# Patient Record
Sex: Female | Born: 1935 | Hispanic: No | Marital: Single | Smoking: Never smoker
Health system: Southern US, Community
[De-identification: ages and names within clinical notes are randomized; demographics above are authoritative.]

## PROBLEM LIST (undated history)

## (undated) DIAGNOSIS — K589 Irritable bowel syndrome without diarrhea: Secondary | ICD-10-CM

## (undated) DIAGNOSIS — M545 Low back pain, unspecified: Secondary | ICD-10-CM

## (undated) DIAGNOSIS — I509 Heart failure, unspecified: Secondary | ICD-10-CM

## (undated) DIAGNOSIS — F32A Depression, unspecified: Secondary | ICD-10-CM

## (undated) DIAGNOSIS — I10 Essential (primary) hypertension: Secondary | ICD-10-CM

## (undated) DIAGNOSIS — F329 Major depressive disorder, single episode, unspecified: Secondary | ICD-10-CM

## (undated) DIAGNOSIS — K219 Gastro-esophageal reflux disease without esophagitis: Secondary | ICD-10-CM

## (undated) DIAGNOSIS — E785 Hyperlipidemia, unspecified: Secondary | ICD-10-CM

## (undated) DIAGNOSIS — K5792 Diverticulitis of intestine, part unspecified, without perforation or abscess without bleeding: Secondary | ICD-10-CM

## (undated) DIAGNOSIS — K5909 Other constipation: Secondary | ICD-10-CM

## (undated) DIAGNOSIS — K635 Polyp of colon: Secondary | ICD-10-CM

## (undated) HISTORY — DX: Hyperlipidemia, unspecified: E78.5

## (undated) HISTORY — DX: Low back pain: M54.5

## (undated) HISTORY — DX: Diverticulitis of intestine, part unspecified, without perforation or abscess without bleeding: K57.92

## (undated) HISTORY — PX: APPENDECTOMY: SHX54

## (undated) HISTORY — DX: Gastro-esophageal reflux disease without esophagitis: K21.9

## (undated) HISTORY — DX: Other constipation: K59.09

## (undated) HISTORY — DX: Irritable bowel syndrome, unspecified: K58.9

## (undated) HISTORY — DX: Major depressive disorder, single episode, unspecified: F32.9

## (undated) HISTORY — DX: Heart failure, unspecified: I50.9

## (undated) HISTORY — DX: Low back pain, unspecified: M54.50

## (undated) HISTORY — DX: Polyp of colon: K63.5

## (undated) HISTORY — PX: TUBAL LIGATION: SHX77

## (undated) HISTORY — PX: CATARACT EXTRACTION: SUR2

## (undated) HISTORY — DX: Depression, unspecified: F32.A

---

## 1993-05-07 ENCOUNTER — Encounter (INDEPENDENT_AMBULATORY_CARE_PROVIDER_SITE_OTHER): Payer: Self-pay | Admitting: Gastroenterology

## 1997-12-19 ENCOUNTER — Ambulatory Visit (HOSPITAL_COMMUNITY): Admission: RE | Admit: 1997-12-19 | Discharge: 1997-12-19 | Payer: Self-pay | Admitting: Gastroenterology

## 1997-12-19 ENCOUNTER — Encounter: Payer: Self-pay | Admitting: Gastroenterology

## 1998-02-01 ENCOUNTER — Encounter: Payer: Self-pay | Admitting: Internal Medicine

## 1998-02-01 ENCOUNTER — Ambulatory Visit (HOSPITAL_COMMUNITY): Admission: RE | Admit: 1998-02-01 | Discharge: 1998-02-01 | Payer: Self-pay | Admitting: Internal Medicine

## 2000-04-09 ENCOUNTER — Other Ambulatory Visit: Admission: RE | Admit: 2000-04-09 | Discharge: 2000-04-09 | Payer: Self-pay | Admitting: *Deleted

## 2001-03-22 LAB — HM COLONOSCOPY

## 2001-04-08 ENCOUNTER — Encounter (INDEPENDENT_AMBULATORY_CARE_PROVIDER_SITE_OTHER): Payer: Self-pay | Admitting: Gastroenterology

## 2001-04-29 ENCOUNTER — Encounter: Payer: Self-pay | Admitting: Ophthalmology

## 2001-05-04 ENCOUNTER — Ambulatory Visit (HOSPITAL_COMMUNITY): Admission: RE | Admit: 2001-05-04 | Discharge: 2001-05-04 | Payer: Self-pay | Admitting: Ophthalmology

## 2001-08-13 ENCOUNTER — Other Ambulatory Visit: Admission: RE | Admit: 2001-08-13 | Discharge: 2001-08-13 | Payer: Self-pay | Admitting: Obstetrics & Gynecology

## 2003-09-15 ENCOUNTER — Ambulatory Visit (HOSPITAL_COMMUNITY): Admission: RE | Admit: 2003-09-15 | Discharge: 2003-09-15 | Payer: Self-pay | Admitting: Gastroenterology

## 2003-11-22 ENCOUNTER — Ambulatory Visit: Payer: Self-pay | Admitting: Internal Medicine

## 2003-12-18 ENCOUNTER — Ambulatory Visit: Payer: Self-pay | Admitting: Internal Medicine

## 2004-04-03 ENCOUNTER — Ambulatory Visit: Payer: Self-pay | Admitting: Gastroenterology

## 2004-05-09 ENCOUNTER — Ambulatory Visit: Payer: Self-pay | Admitting: Gastroenterology

## 2004-06-11 ENCOUNTER — Emergency Department (HOSPITAL_COMMUNITY): Admission: EM | Admit: 2004-06-11 | Discharge: 2004-06-11 | Payer: Self-pay | Admitting: Family Medicine

## 2004-10-21 ENCOUNTER — Ambulatory Visit: Payer: Self-pay | Admitting: Internal Medicine

## 2004-12-03 ENCOUNTER — Ambulatory Visit: Payer: Self-pay | Admitting: Internal Medicine

## 2005-04-09 ENCOUNTER — Ambulatory Visit: Payer: Self-pay | Admitting: Internal Medicine

## 2005-05-26 ENCOUNTER — Ambulatory Visit: Payer: Self-pay | Admitting: Internal Medicine

## 2005-06-18 ENCOUNTER — Ambulatory Visit: Payer: Self-pay | Admitting: Internal Medicine

## 2005-10-23 ENCOUNTER — Ambulatory Visit: Payer: Self-pay | Admitting: Internal Medicine

## 2005-12-17 ENCOUNTER — Ambulatory Visit: Payer: Self-pay | Admitting: Internal Medicine

## 2005-12-24 ENCOUNTER — Ambulatory Visit (HOSPITAL_COMMUNITY): Admission: RE | Admit: 2005-12-24 | Discharge: 2005-12-24 | Payer: Self-pay | Admitting: Otolaryngology

## 2006-03-03 ENCOUNTER — Ambulatory Visit: Payer: Self-pay | Admitting: Internal Medicine

## 2006-03-25 ENCOUNTER — Ambulatory Visit: Payer: Self-pay | Admitting: Internal Medicine

## 2006-04-07 ENCOUNTER — Ambulatory Visit: Payer: Self-pay | Admitting: Internal Medicine

## 2006-04-07 LAB — CONVERTED CEMR LAB
OCCULT 1: NEGATIVE
OCCULT 3: NEGATIVE
OCCULT 4: NEGATIVE

## 2006-04-20 ENCOUNTER — Ambulatory Visit: Payer: Self-pay | Admitting: Internal Medicine

## 2006-06-19 ENCOUNTER — Ambulatory Visit: Payer: Self-pay | Admitting: Gastroenterology

## 2006-09-12 DIAGNOSIS — F329 Major depressive disorder, single episode, unspecified: Secondary | ICD-10-CM

## 2006-09-12 DIAGNOSIS — Z860100 Personal history of colon polyps, unspecified: Secondary | ICD-10-CM | POA: Insufficient documentation

## 2006-09-12 DIAGNOSIS — I509 Heart failure, unspecified: Secondary | ICD-10-CM

## 2006-09-12 DIAGNOSIS — E785 Hyperlipidemia, unspecified: Secondary | ICD-10-CM

## 2006-09-12 DIAGNOSIS — K589 Irritable bowel syndrome without diarrhea: Secondary | ICD-10-CM

## 2006-09-12 DIAGNOSIS — K219 Gastro-esophageal reflux disease without esophagitis: Secondary | ICD-10-CM

## 2006-09-12 DIAGNOSIS — M545 Low back pain: Secondary | ICD-10-CM

## 2006-09-12 DIAGNOSIS — J309 Allergic rhinitis, unspecified: Secondary | ICD-10-CM | POA: Insufficient documentation

## 2006-09-22 ENCOUNTER — Ambulatory Visit: Payer: Self-pay | Admitting: Internal Medicine

## 2006-09-22 LAB — CONVERTED CEMR LAB
Bilirubin Urine: NEGATIVE
Ketones, ur: NEGATIVE mg/dL
Specific Gravity, Urine: 1.01 (ref 1.000–1.03)
Urine Glucose: NEGATIVE mg/dL
Urobilinogen, UA: 0.2 (ref 0.0–1.0)

## 2006-10-07 ENCOUNTER — Encounter: Admission: RE | Admit: 2006-10-07 | Discharge: 2006-10-07 | Payer: Self-pay | Admitting: Otolaryngology

## 2006-10-07 ENCOUNTER — Encounter (INDEPENDENT_AMBULATORY_CARE_PROVIDER_SITE_OTHER): Payer: Self-pay | Admitting: Interventional Radiology

## 2006-10-07 ENCOUNTER — Other Ambulatory Visit: Admission: RE | Admit: 2006-10-07 | Discharge: 2006-10-07 | Payer: Self-pay | Admitting: Interventional Radiology

## 2006-11-03 ENCOUNTER — Ambulatory Visit: Payer: Self-pay | Admitting: Internal Medicine

## 2007-01-05 ENCOUNTER — Ambulatory Visit: Payer: Self-pay | Admitting: Internal Medicine

## 2007-03-26 DIAGNOSIS — I2510 Atherosclerotic heart disease of native coronary artery without angina pectoris: Secondary | ICD-10-CM | POA: Insufficient documentation

## 2007-03-26 DIAGNOSIS — E041 Nontoxic single thyroid nodule: Secondary | ICD-10-CM | POA: Insufficient documentation

## 2007-03-26 DIAGNOSIS — K2990 Gastroduodenitis, unspecified, without bleeding: Secondary | ICD-10-CM

## 2007-03-26 DIAGNOSIS — I10 Essential (primary) hypertension: Secondary | ICD-10-CM | POA: Insufficient documentation

## 2007-03-26 DIAGNOSIS — G4700 Insomnia, unspecified: Secondary | ICD-10-CM

## 2007-03-26 DIAGNOSIS — N200 Calculus of kidney: Secondary | ICD-10-CM

## 2007-03-26 DIAGNOSIS — K2970 Gastritis, unspecified, without bleeding: Secondary | ICD-10-CM | POA: Insufficient documentation

## 2007-04-16 ENCOUNTER — Ambulatory Visit: Payer: Self-pay | Admitting: Internal Medicine

## 2007-04-16 DIAGNOSIS — N3090 Cystitis, unspecified without hematuria: Secondary | ICD-10-CM | POA: Insufficient documentation

## 2007-04-16 DIAGNOSIS — R109 Unspecified abdominal pain: Secondary | ICD-10-CM | POA: Insufficient documentation

## 2007-04-21 LAB — CONVERTED CEMR LAB
Bilirubin Urine: NEGATIVE
Hemoglobin, Urine: NEGATIVE
Specific Gravity, Urine: 1.01 (ref 1.000–1.03)
Total Protein, Urine: NEGATIVE mg/dL
Urine Glucose: NEGATIVE mg/dL

## 2007-05-04 ENCOUNTER — Ambulatory Visit: Payer: Self-pay | Admitting: Internal Medicine

## 2007-05-04 DIAGNOSIS — K5900 Constipation, unspecified: Secondary | ICD-10-CM

## 2007-05-04 LAB — CONVERTED CEMR LAB
Basophils Absolute: 0.4 10*3/uL — ABNORMAL HIGH (ref 0.0–0.1)
Basophils Relative: 4.4 % — ABNORMAL HIGH (ref 0.0–1.0)
Bilirubin Urine: NEGATIVE
CO2: 33 meq/L — ABNORMAL HIGH (ref 19–32)
Calcium: 9.5 mg/dL (ref 8.4–10.5)
Creatinine, Ser: 0.9 mg/dL (ref 0.4–1.2)
Crystals: NEGATIVE
Eosinophils Absolute: 0.4 10*3/uL (ref 0.0–0.7)
GFR calc Af Amer: 77 mL/min
HCT: 39.8 % (ref 36.0–46.0)
Hemoglobin: 12.9 g/dL (ref 12.0–15.0)
Ketones, ur: NEGATIVE mg/dL
Lymphocytes Relative: 23.3 % (ref 12.0–46.0)
MCHC: 32.4 g/dL (ref 30.0–36.0)
MCV: 87.7 fL (ref 78.0–100.0)
Monocytes Absolute: 0.8 10*3/uL (ref 0.1–1.0)
Neutro Abs: 4.7 10*3/uL (ref 1.4–7.7)
RBC: 4.54 M/uL (ref 3.87–5.11)
RDW: 12.4 % (ref 11.5–14.6)
Sodium: 140 meq/L (ref 135–145)
Urine Glucose: NEGATIVE mg/dL
pH: 7 (ref 5.0–8.0)

## 2007-05-05 ENCOUNTER — Telehealth: Payer: Self-pay | Admitting: Internal Medicine

## 2007-05-11 ENCOUNTER — Ambulatory Visit: Payer: Self-pay | Admitting: Cardiology

## 2007-07-07 ENCOUNTER — Ambulatory Visit: Payer: Self-pay | Admitting: Internal Medicine

## 2007-08-10 ENCOUNTER — Telehealth: Payer: Self-pay | Admitting: Internal Medicine

## 2007-08-11 ENCOUNTER — Ambulatory Visit: Payer: Self-pay | Admitting: Internal Medicine

## 2007-08-31 ENCOUNTER — Ambulatory Visit: Payer: Self-pay | Admitting: Internal Medicine

## 2007-08-31 DIAGNOSIS — R071 Chest pain on breathing: Secondary | ICD-10-CM

## 2007-10-15 ENCOUNTER — Ambulatory Visit: Payer: Self-pay | Admitting: Internal Medicine

## 2007-10-15 DIAGNOSIS — R609 Edema, unspecified: Secondary | ICD-10-CM

## 2007-10-19 ENCOUNTER — Ambulatory Visit: Payer: Self-pay | Admitting: Internal Medicine

## 2007-10-19 LAB — CONVERTED CEMR LAB
Bilirubin Urine: NEGATIVE
CO2: 32 meq/L (ref 19–32)
Calcium: 9.5 mg/dL (ref 8.4–10.5)
Creatinine, Ser: 1.1 mg/dL (ref 0.4–1.2)
GFR calc Af Amer: 61 mL/min
Glucose, Bld: 102 mg/dL — ABNORMAL HIGH (ref 70–99)
Hemoglobin, Urine: NEGATIVE
Nitrite: NEGATIVE
Sodium: 141 meq/L (ref 135–145)
TSH: 0.58 microintl units/mL (ref 0.35–5.50)
Total Protein, Urine: NEGATIVE mg/dL
Urobilinogen, UA: 0.2 (ref 0.0–1.0)

## 2007-10-27 ENCOUNTER — Telehealth: Payer: Self-pay | Admitting: Internal Medicine

## 2008-01-17 ENCOUNTER — Telehealth (INDEPENDENT_AMBULATORY_CARE_PROVIDER_SITE_OTHER): Payer: Self-pay | Admitting: *Deleted

## 2008-01-20 ENCOUNTER — Telehealth: Payer: Self-pay | Admitting: Family Medicine

## 2008-01-26 ENCOUNTER — Ambulatory Visit: Payer: Self-pay | Admitting: Internal Medicine

## 2008-01-31 LAB — CONVERTED CEMR LAB
Bilirubin Urine: NEGATIVE
Hemoglobin, Urine: NEGATIVE
Ketones, ur: NEGATIVE mg/dL
Total Protein, Urine: NEGATIVE mg/dL
pH: 8.5 — AB (ref 5.0–8.0)

## 2008-04-04 ENCOUNTER — Telehealth: Payer: Self-pay | Admitting: Internal Medicine

## 2008-04-17 ENCOUNTER — Ambulatory Visit: Payer: Self-pay | Admitting: Internal Medicine

## 2008-04-17 DIAGNOSIS — M25519 Pain in unspecified shoulder: Secondary | ICD-10-CM

## 2008-04-17 DIAGNOSIS — M79609 Pain in unspecified limb: Secondary | ICD-10-CM

## 2008-07-13 ENCOUNTER — Ambulatory Visit: Payer: Self-pay | Admitting: Internal Medicine

## 2008-07-13 LAB — CONVERTED CEMR LAB
Bilirubin Urine: NEGATIVE
Ketones, ur: NEGATIVE mg/dL
Specific Gravity, Urine: 1.01 (ref 1.000–1.030)
Total Protein, Urine: NEGATIVE mg/dL
Urine Glucose: NEGATIVE mg/dL
pH: 7.5 (ref 5.0–8.0)

## 2008-07-14 ENCOUNTER — Telehealth: Payer: Self-pay | Admitting: Internal Medicine

## 2008-07-23 ENCOUNTER — Telehealth: Payer: Self-pay | Admitting: Family Medicine

## 2008-09-18 ENCOUNTER — Ambulatory Visit: Payer: Self-pay | Admitting: Internal Medicine

## 2008-09-18 DIAGNOSIS — J209 Acute bronchitis, unspecified: Secondary | ICD-10-CM

## 2008-11-17 ENCOUNTER — Telehealth: Payer: Self-pay | Admitting: Internal Medicine

## 2008-11-29 ENCOUNTER — Telehealth: Payer: Self-pay | Admitting: Internal Medicine

## 2009-01-23 ENCOUNTER — Ambulatory Visit: Payer: Self-pay | Admitting: Internal Medicine

## 2009-01-23 LAB — CONVERTED CEMR LAB
Specific Gravity, Urine: 1.01 (ref 1.000–1.030)
Total Protein, Urine: NEGATIVE mg/dL
Urine Glucose: NEGATIVE mg/dL
Urobilinogen, UA: 0.2 (ref 0.0–1.0)
pH: 8.5 (ref 5.0–8.0)

## 2009-02-09 ENCOUNTER — Telehealth: Payer: Self-pay | Admitting: Internal Medicine

## 2009-02-17 ENCOUNTER — Encounter: Payer: Self-pay | Admitting: Internal Medicine

## 2009-02-19 ENCOUNTER — Telehealth: Payer: Self-pay | Admitting: Internal Medicine

## 2009-04-10 ENCOUNTER — Telehealth: Payer: Self-pay | Admitting: Internal Medicine

## 2009-04-11 ENCOUNTER — Ambulatory Visit: Payer: Self-pay | Admitting: Internal Medicine

## 2009-04-11 DIAGNOSIS — E876 Hypokalemia: Secondary | ICD-10-CM

## 2009-04-12 ENCOUNTER — Telehealth: Payer: Self-pay | Admitting: Internal Medicine

## 2009-04-18 ENCOUNTER — Telehealth: Payer: Self-pay | Admitting: Internal Medicine

## 2009-04-23 ENCOUNTER — Ambulatory Visit: Payer: Self-pay | Admitting: Internal Medicine

## 2009-04-23 LAB — CONVERTED CEMR LAB
ALT: 13 units/L (ref 0–35)
BUN: 9 mg/dL (ref 6–23)
CO2: 36 meq/L — ABNORMAL HIGH (ref 19–32)
Chloride: 101 meq/L (ref 96–112)
Creatinine, Ser: 1.1 mg/dL (ref 0.4–1.2)
TSH: 0.55 microintl units/mL (ref 0.35–5.50)
Total Protein: 7.9 g/dL (ref 6.0–8.3)

## 2009-04-24 ENCOUNTER — Ambulatory Visit: Payer: Self-pay | Admitting: Internal Medicine

## 2009-05-02 ENCOUNTER — Telehealth (INDEPENDENT_AMBULATORY_CARE_PROVIDER_SITE_OTHER): Payer: Self-pay | Admitting: *Deleted

## 2009-06-29 ENCOUNTER — Ambulatory Visit: Payer: Self-pay | Admitting: Internal Medicine

## 2009-06-29 LAB — CONVERTED CEMR LAB
Calcium: 9.7 mg/dL (ref 8.4–10.5)
GFR calc non Af Amer: 70.63 mL/min (ref >60)
Glucose, Bld: 129 mg/dL — ABNORMAL HIGH (ref 70–99)
Sodium: 142 meq/L (ref 135–145)

## 2009-07-04 ENCOUNTER — Ambulatory Visit: Payer: Self-pay | Admitting: Internal Medicine

## 2009-09-05 ENCOUNTER — Ambulatory Visit: Payer: Self-pay | Admitting: Internal Medicine

## 2009-09-05 DIAGNOSIS — M542 Cervicalgia: Secondary | ICD-10-CM | POA: Insufficient documentation

## 2009-09-05 DIAGNOSIS — Z9181 History of falling: Secondary | ICD-10-CM

## 2009-09-05 DIAGNOSIS — M546 Pain in thoracic spine: Secondary | ICD-10-CM

## 2009-09-19 ENCOUNTER — Ambulatory Visit: Payer: Self-pay | Admitting: Internal Medicine

## 2009-09-19 DIAGNOSIS — R1013 Epigastric pain: Secondary | ICD-10-CM

## 2009-09-21 ENCOUNTER — Telehealth: Payer: Self-pay | Admitting: Internal Medicine

## 2009-09-26 ENCOUNTER — Encounter: Payer: Self-pay | Admitting: Internal Medicine

## 2009-09-27 LAB — CONVERTED CEMR LAB
ALT: 11 units/L (ref 0–35)
Basophils Absolute: 0 10*3/uL (ref 0.0–0.1)
Basophils Relative: 0.6 % (ref 0.0–3.0)
Bilirubin Urine: NEGATIVE
Bilirubin, Direct: 0.1 mg/dL (ref 0.0–0.3)
Chloride: 93 meq/L — ABNORMAL LOW (ref 96–112)
Creatinine, Ser: 0.9 mg/dL (ref 0.4–1.2)
Eosinophils Relative: 4.9 % (ref 0.0–5.0)
HCT: 41.3 % (ref 36.0–46.0)
Hemoglobin: 13.9 g/dL (ref 12.0–15.0)
Ketones, ur: NEGATIVE mg/dL
Lipase: 28 units/L (ref 11.0–59.0)
Lymphs Abs: 1.5 10*3/uL (ref 0.7–4.0)
Monocytes Relative: 14.9 % — ABNORMAL HIGH (ref 3.0–12.0)
Neutro Abs: 2.2 10*3/uL (ref 1.4–7.7)
Potassium: 3.3 meq/L — ABNORMAL LOW (ref 3.5–5.1)
RBC: 4.78 M/uL (ref 3.87–5.11)
RDW: 13.4 % (ref 11.5–14.6)
Total Bilirubin: 0.6 mg/dL (ref 0.3–1.2)
Total Protein, Urine: NEGATIVE mg/dL
Urine Glucose: NEGATIVE mg/dL
pH: 7 (ref 5.0–8.0)

## 2009-10-30 ENCOUNTER — Telehealth: Payer: Self-pay | Admitting: Internal Medicine

## 2009-12-20 ENCOUNTER — Ambulatory Visit: Payer: Self-pay | Admitting: Internal Medicine

## 2009-12-27 LAB — CONVERTED CEMR LAB
ALT: 12 units/L (ref 0–35)
BUN: 11 mg/dL (ref 6–23)
Basophils Relative: 0.7 % (ref 0.0–3.0)
Bilirubin Urine: NEGATIVE
Bilirubin, Direct: 0.1 mg/dL (ref 0.0–0.3)
CO2: 30 meq/L (ref 19–32)
Chloride: 99 meq/L (ref 96–112)
Cholesterol: 279 mg/dL — ABNORMAL HIGH (ref 0–200)
Creatinine, Ser: 0.9 mg/dL (ref 0.4–1.2)
Eosinophils Absolute: 0.2 10*3/uL (ref 0.0–0.7)
Glucose, Bld: 85 mg/dL (ref 70–99)
HCT: 43.4 % (ref 36.0–46.0)
HDL: 65.2 mg/dL (ref >39.00)
Ketones, ur: NEGATIVE mg/dL
Leukocytes, UA: NEGATIVE
Lymphs Abs: 1.4 10*3/uL (ref 0.7–4.0)
MCHC: 34.2 g/dL (ref 30.0–36.0)
MCV: 86.6 fL (ref 78.0–100.0)
Monocytes Absolute: 0.4 10*3/uL (ref 0.1–1.0)
Neutrophils Relative %: 61.3 % (ref 43.0–77.0)
Platelets: 264 10*3/uL (ref 150.0–400.0)
Potassium: 4 meq/L (ref 3.5–5.1)
Specific Gravity, Urine: 1.01 (ref 1.000–1.030)
TSH: 0.61 microintl units/mL (ref 0.35–5.50)
Total Bilirubin: 0.3 mg/dL (ref 0.3–1.2)
Total Protein: 7.5 g/dL (ref 6.0–8.3)
Triglycerides: 169 mg/dL — ABNORMAL HIGH (ref 0.0–149.0)
Urine Glucose: NEGATIVE mg/dL
pH: 8 (ref 5.0–8.0)

## 2009-12-28 ENCOUNTER — Telehealth: Payer: Self-pay | Admitting: Internal Medicine

## 2010-01-16 ENCOUNTER — Telehealth: Payer: Self-pay | Admitting: Internal Medicine

## 2010-02-10 ENCOUNTER — Encounter: Payer: Self-pay | Admitting: Otolaryngology

## 2010-02-10 ENCOUNTER — Encounter: Payer: Self-pay | Admitting: Internal Medicine

## 2010-02-15 ENCOUNTER — Ambulatory Visit
Admission: RE | Admit: 2010-02-15 | Discharge: 2010-02-15 | Payer: Self-pay | Source: Home / Self Care | Attending: Internal Medicine | Admitting: Internal Medicine

## 2010-02-15 DIAGNOSIS — R142 Eructation: Secondary | ICD-10-CM

## 2010-02-15 DIAGNOSIS — R143 Flatulence: Secondary | ICD-10-CM

## 2010-02-15 DIAGNOSIS — R141 Gas pain: Secondary | ICD-10-CM | POA: Insufficient documentation

## 2010-02-17 ENCOUNTER — Emergency Department (HOSPITAL_COMMUNITY)
Admission: EM | Admit: 2010-02-17 | Discharge: 2010-02-17 | Payer: Self-pay | Source: Home / Self Care | Admitting: Emergency Medicine

## 2010-02-17 LAB — URINALYSIS, ROUTINE W REFLEX MICROSCOPIC
Bilirubin Urine: NEGATIVE
Hgb urine dipstick: NEGATIVE
Ketones, ur: NEGATIVE mg/dL
Protein, ur: NEGATIVE mg/dL
Urine Glucose, Fasting: NEGATIVE mg/dL
pH: 8.5 — ABNORMAL HIGH (ref 5.0–8.0)

## 2010-02-17 LAB — POCT CARDIAC MARKERS: Troponin i, poc: <0.05 ng/mL (ref 0.00–0.09)

## 2010-02-17 LAB — CBC
HCT: 43.1 % (ref 36.0–46.0)
Hemoglobin: 14.1 g/dL (ref 12.0–15.0)
MCH: 28.4 pg (ref 26.0–34.0)
MCHC: 32.7 g/dL (ref 30.0–36.0)
MCV: 86.9 fL (ref 78.0–100.0)
RBC: 4.96 MIL/uL (ref 3.87–5.11)

## 2010-02-17 LAB — DIFFERENTIAL
Basophils Relative: 0 % (ref 0–1)
Lymphocytes Relative: 23 % (ref 12–46)
Lymphs Abs: 1.1 10*3/uL (ref 0.7–4.0)
Monocytes Absolute: 0.3 10*3/uL (ref 0.1–1.0)
Monocytes Relative: 7 % (ref 3–12)
Neutro Abs: 3.3 10*3/uL (ref 1.7–7.7)
Neutrophils Relative %: 68 % (ref 43–77)

## 2010-02-17 LAB — BASIC METABOLIC PANEL
BUN: 7 mg/dL (ref 6–23)
CO2: 31 mEq/L (ref 19–32)
Chloride: 99 mEq/L (ref 96–112)
Creatinine, Ser: 0.94 mg/dL (ref 0.4–1.2)
Potassium: 3 mEq/L — ABNORMAL LOW (ref 3.5–5.1)

## 2010-02-17 LAB — CONVERTED CEMR LAB
ALT: 14 units/L (ref 0–35)
Alkaline Phosphatase: 125 units/L — ABNORMAL HIGH (ref 39–117)
Basophils Absolute: 0 10*3/uL (ref 0.0–0.1)
Bilirubin, Direct: 0 mg/dL (ref 0.0–0.3)
CO2: 36 meq/L — ABNORMAL HIGH (ref 19–32)
Chloride: 91 meq/L — ABNORMAL LOW (ref 96–112)
GFR calc non Af Amer: 87.22 mL/min (ref >60)
Glucose, Bld: 160 mg/dL — ABNORMAL HIGH (ref 70–99)
HCT: 41.9 % (ref 36.0–46.0)
Hemoglobin, Urine: NEGATIVE
Hemoglobin: 13.6 g/dL (ref 12.0–15.0)
Ketones, ur: NEGATIVE mg/dL
Lymphs Abs: 1.4 10*3/uL (ref 0.7–4.0)
MCV: 88.9 fL (ref 78.0–100.0)
Monocytes Absolute: 0.5 10*3/uL (ref 0.1–1.0)
Monocytes Relative: 12.1 % — ABNORMAL HIGH (ref 3.0–12.0)
Neutro Abs: 2.1 10*3/uL (ref 1.4–7.7)
Potassium: 2.8 meq/L — CL (ref 3.5–5.1)
RDW: 12.5 % (ref 11.5–14.6)
Sodium: 137 meq/L (ref 135–145)
Total Protein: 7.8 g/dL (ref 6.0–8.3)
Urine Glucose: NEGATIVE mg/dL
Urobilinogen, UA: 0.2 (ref 0.0–1.0)

## 2010-02-18 ENCOUNTER — Telehealth: Payer: Self-pay | Admitting: Internal Medicine

## 2010-02-18 LAB — URINE CULTURE: Culture  Setup Time: 201201291715

## 2010-02-19 ENCOUNTER — Telehealth: Payer: Self-pay | Admitting: Internal Medicine

## 2010-02-19 NOTE — Assessment & Plan Note (Signed)
Summary: FELL/ HURT CHIN/ TOE/ KNEE/NWS   Vital Signs:  Patient profile:   77 year old female Height:      61 inches Weight:      95 pounds BMI:     18.01 Temp:     98.6 degrees F oral Pulse rhythm:   regular Resp:     16 per minute BP sitting:   120 / 80  (left arm) Cuff size:   regular  Vitals Entered By: Lanier Prude, Beverly Gust) (September 05, 2009 4:13 PM) CC: pt fell yest in parking lot at her apartments. c/o chest/chin pain/HA Is Patient Diabetic? No   Primary Care Ellery Meroney:  Dorathy Daft, MD  CC:  pt fell yest in parking lot at her apartments. c/o chest/chin pain/HA.  History of Present Illness: She tripped at 1 pm on her parking lot and fell, hit her chin. No LOC. No n/v. C/o CP, neck and shoulder pain. No HA.  Current Medications (verified): 1)  Claritin 10 Mg  Tabs (Loratadine) .[name].. 1 By Mouth Once Daily 2)  Vitamin D3 1000 Unit  Tabs (Cholecalciferol) .[name].. 1 By Mouth Daily 3)  Omeprazole 20 Mg Cpdr (Omeprazole) .... One By Mouth Daily 4)  Spironolactone 25 Mg Tabs (Spironolactone) .[name].. 1 By Mouth Qam For Blood Pressure 5)  Ambien 10 Mg Tabs (Zolpidem Tartrate) .[name].. 1 Tab At Bedtime 6)  Ranitidine Hcl 150 Mg Tabs (Ranitidine Hcl) .[name].. 1 By Mouth Two Times A Day For Indigestion 7)  Klor-Con M10 10 Meq Cr-Tabs (Potassium Chloride Crys Cr) .[name].. 1 By Mouth Qd  Allergies (verified): 1)  ! Pcn 2)  ! Sulfa 3)  ! 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  The patient denies fever, dyspnea on exertion, abdominal pain, difficulty walking, and abnormal bleeding.    Physical  Exam  General:  Well-developed,well-nourished,in no acute distress; alert,appropriate and cooperative throughout examination. Looks younger than her age Head:  NT Eyes:  No corneal or conjunctival inflammation noted. EOMI. Perrla Ears:  External ear exam shows no significant lesions or deformities.  Otoscopic examination reveals clear canals, tympanic membranes are intact bilaterally without bulging, retraction, inflammation or discharge. Hearing is grossly normal bilaterally. Nose:  External nasal examination shows no deformity or inflammation. Nasal mucosa are pink and moist without lesions or exudates. Mouth:  Oral mucosa and oropharynx without lesions or exudates.  Teeth in good repair. Neck:  Cervical spine is slightely tender to palpation over paraspinal muscles and with the ROM  Chest Wall:  NT Lungs:  CTA Heart:  RRR Abdomen:  soft, non-tender, and no masses.   Msk:  LS NT, knees NT B Pulses:  R and L carotid,radial,femoral,dorsalis pedis and posterior tibial pulses are full and equal bilaterally Neurologic:  No cranial nerve deficits noted. Station and gait are normal. Plantar reflexes are down-going bilaterally. DTRs are symmetrical throughout. Sensory, motor and coordinative functions appear intact. Skin:  Two 1 cm abrasion at the bottom chin Psych:  Cognition and judgment appear intact. Alert and cooperative with normal attention span and concentration. No apparent delusions, illusions, hallucinations   Impression & Recommendations:  Problem # 1:  BACK PAIN, THORACIC REGION (ICD-724.1) s/p  fall, MSK Assessment New  Orders: T-Thoracic Spine 2 Views [phone])  Problem # 2:  NECK PAIN (ICD-723.1) MSK s/p fall Assessment: New She declined dT shot, other pain meds PT offered Orders: T-Cervical Spine Comp 4 Views [phone]) T-Thoracic Spine 2 Views [phone])  Problem # 3:  FALL, HX OF (ICD-V15.88) Assessment: New  Orders: T-Cervical Spine Comp 4 Views  (72050TC) T-Thoracic Spine 2 Views (41324MW)  Problem # 4:  SHOULDER PAIN (ICD-719.41) Assessment: Unchanged PT offered  Complete Medication List: 1)  Claritin 10 Mg Tabs (Loratadine) .[name].. 1 by mouth once daily 2)  Vitamin D3 1000 Unit Tabs (Cholecalciferol) .[name].. 1 by mouth daily 3)  Omeprazole 20 Mg Cpdr (Omeprazole) .... One by mouth daily 4)  Spironolactone 25 Mg Tabs (Spironolactone) .[name].. 1 by mouth qam for blood pressure 5)  Ambien 10 Mg Tabs (Zolpidem tartrate) .[name].. 1 tab at bedtime 6)  Ranitidine Hcl 150 Mg Tabs (Ranitidine hcl) .[name].. 1 by mouth two times a day for indigestion 7)  Klor-con M10 10 Meq Cr-tabs (Potassium chloride crys cr) .[name].. 1 by mouth qd  Contraindications/Deferment of Procedures/Staging:    Test/Procedure: TD vaccine    Reason for deferment: declined   Patient Instructions: 1)  Call if you are not better in a reasonable amount of time or if worse. Go to ER if feeling really bad!

## 2010-02-19 NOTE — Progress Notes (Signed)
Summary: WORK IN?  Phone Note Call from Patient   Summary of Call: Pt left vm. She is req work in apt with Dr [employer] tomorrow. C/o fatigue, feet swelling and lower stomach discomfort.  Initial call taken by: Lamar Sprinkles, CMA,  April 10, 2009 3:24 PM  Follow-up for Phone Call        ok Follow-up by: Tresa Garter MD,  April 10, 2009 5:57 PM  Additional Follow-up for Phone Call Additional follow up Details #1::        APPT TODAY AT 3:30. Additional Follow-up by: Hilarie Fredrickson,  April 11, 2009 9:25 AM

## 2010-02-19 NOTE — Assessment & Plan Note (Signed)
Summary: 3 MO ROV /NWS  #   Vital Signs:  Patient profile:   77 year old female Height:      61 inches Weight:      97.50 pounds O2 Sat:      97 % on Room air Temp:     97.7 degrees F oral Pulse rate:   80 / minute BP sitting:   120 / 80  (left arm) Cuff size:   regular  Vitals Entered By: Lucious Groves (April 24, 2009 2:31 PM)  O2 Flow:  Room air CC: 3 mo rtn ov--Discuss Triazolam--It makes pt groggy the next day./kb Is Patient Diabetic? No Pain Assessment Patient in pain? no        Primary Care Provider:  Dorathy Daft, MD  CC:  3 mo rtn ov--Discuss Triazolam--It makes pt groggy the next day./kb.  History of Present Illness: F/u abd pain - resolved C/o fatigue and sweats w/Triazolazm C/o insomnia F/u hypokalemia C/o cough w/yellow sputum/hoarse x 1 wk  Current Medications (verified): 1)  Claritin 10 Mg  Tabs (Loratadine) .[name].. 1 By Mouth Once Daily 2)  Hydrochlorothiazide 12.5 Mg  Tabs (Hydrochlorothiazide) .... Take 1 Tab  By Mouth Every Morning 3)  Vitamin D3 1000 Unit  Tabs (Cholecalciferol) .[name].. 1 By Mouth Daily 4)  Omeprazole 20 Mg Cpdr (Omeprazole) .... One By Mouth Daily 5)  Triazolam 0.25 Mg Tabs (Triazolam) .[name].. 1 By Mouth At Bedtime As Needed Insomnia 6)  Klor-Con M10 10 Meq Cr-Tabs (Potassium Chloride Crys Cr) .[name].. 1 By Mouth Bid  Allergies (verified): 1)  ! Pcn 2)  ! Sulfa 3)  ! 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     Physical Exam  General:  Well-developed,well-nourished,in no acute distress; alert,appropriate and cooperative throughout examination Nose:  WNL Mouth:  Oral mucosa and oropharynx without lesions or exudates.  Teeth in good repair. Lungs:  Normal respiratory effort, chest expands symmetrically. Lungs are clear to auscultation, no crackles or wheezes. Heart:  Normal rate and regular rhythm. S1 and S2 normal without gallop, murmur, click, rub or other extra sounds. Abdomen:  Bowel sounds positive,abdomen soft and round, non-tender without masses, organomegaly or hernias noted. Msk:  No deformity or scoliosis noted of thoracic or lumbar spine.   Extremities:  No clubbing, cyanosis, edema, or deformity noted with normal full range of motion of all joints.   Neurologic:  No cranial nerve deficits noted. Station and gait are normal. Plantar reflexes are down-going bilaterally. DTRs are symmetrical throughout. Sensory, motor and coordinative functions appear intact. Skin:  Intact without suspicious lesions or rashes Psych:  Cognition and judgment appear intact. Alert and cooperative with normal attention span and concentration. No apparent delusions, illusions, hallucinations   Impression & Recommendations:  Problem # 1:  HYPOKALEMIA (ICD-276.8) Assessment Improved See #2 The labs were reviewed with the patient.   Problem # 2:  HYPERTENSION (ICD-401.9) Assessment: Comment Only  The following medications were removed from the medication list:    Hydrochlorothiazide 12.5 Mg Tabs (Hydrochlorothiazide) .[name]... Take 1 tab  by mouth every morning Her updated medication list  for this problem includes:    Spironolactone 25 Mg Tabs (Spironolactone) .[name]... 1 by mouth qam for blood pressure  Problem # 3:  INSOMNIA (ICD-780.52) Assessment: Unchanged  The following medications were removed from the medication list:    Triazolam 0.25 Mg Tabs (Triazolam) .[name]... 1 by mouth at bedtime as needed  insomnia Her updated medication list for this problem includes:    Silenor 3 Mg Tabs (Doxepin hcl) .[name]... 1 or 1/2 tab by mouth at bedtime as needed insomnia  Problem # 4:  BRONCHITIS, ACUTE (ICD-466.0) Assessment: New  Her updated medication list for this problem includes:    Zithromax Z-pak 250 Mg Tabs (Azithromycin) .[name]... As dirrected  Complete Medication List: 1)  Claritin 10 Mg Tabs (Loratadine) .[name].. 1 by mouth once daily 2)  Vitamin D3 1000 Unit Tabs (Cholecalciferol) .[name].. 1 by mouth daily 3)  Omeprazole 20 Mg Cpdr (Omeprazole) .... One by mouth daily 4)  Spironolactone 25 Mg Tabs (Spironolactone) .[name].. 1 by mouth qam for blood pressure 5)  Silenor 3 Mg Tabs (Doxepin hcl) .[name].. 1 or 1/2 tab by mouth at bedtime as needed insomnia 6)  Zithromax Z-pak 250 Mg Tabs (Azithromycin) .... As dirrected  Patient Instructions: 1)  Please schedule a follow-up appointment in 2 months. 2)  BMP prior to visit, ICD-9: 401.1 Prescriptions: SPIRONOLACTONE 25 MG TABS (SPIRONOLACTONE) 1 by mouth qam for blood pressure  #30 x 12   Entered and Authorized by:   Tresa Garter MD   Signed by:   Tresa Garter MD on 04/24/2009   Method used:   Print then Give to Patient   RxID:   [id number] SILENOR 3 MG TABS (DOXEPIN HCL) 1 or 1/2 tab by mouth at bedtime as needed insomnia  #30 x 6   Entered and Authorized by:   Tresa Garter MD   Signed by:   Tresa Garter MD on 04/24/2009   Method used:   Print then Give to Patient   RxID:   [id number] ZITHROMAX Z-PAK 250 MG TABS (AZITHROMYCIN) as dirrected  #1 x 0   Entered and Authorized by:   Tresa Garter MD   Signed by:   Tresa Garter MD on 04/24/2009   Method used:   Print then Give to Patient   RxID:   [id number] SILENOR 3 MG TABS (DOXEPIN HCL) 1 or 1/2 tab by mouth at bedtime as needed insomnia  #30 x 6   Entered and Authorized by:   Tresa Garter MD   Signed by:   Tresa Garter MD on  04/24/2009   Method used:   Print then Give to Patient   RxID:   [id number] SPIRONOLACTONE 25 MG TABS (SPIRONOLACTONE) 1 by mouth qam for blood pressure  #30 x 12   Entered and Authorized by:   Tresa Garter MD   Signed by:   Tresa Garter MD on 04/24/2009   Method used:   Electronically to        [facility] (retail)       [address]       [place], [place]  [address]       Ph: [phone]       Fax: [phone]   RxID:   [id number]

## 2010-02-19 NOTE — Progress Notes (Signed)
Summary: alternate sleep med  Phone Note Call from Patient Call back at Home Phone [phone]   Summary of Call: Patient left message on triage that she needs a different sleeping pill. Patient says that the pill she was given has side effects dealing with the liver/pancreas and she would like something else. Patient specified that she needs it called in today. Please advise. Initial call taken by: Lucious Groves,  April 18, 2009 9:08 AM  Follow-up for Phone Call        Triazolam is no worse than the others. OK to try. Any med may have side effects. She can try OTC Tylenol pm Follow-up by: Tresa Garter MD,  April 18, 2009 2:29 PM  Additional Follow-up for Phone Call Additional follow up Details #1::        patient notified. Additional Follow-up by: Lucious Groves,  April 18, 2009 2:38 PM

## 2010-02-19 NOTE — Progress Notes (Signed)
Summary: REFILL  Phone Note Refill Request   Refills Requested: Medication #1:  AMBIEN 10 MG TABS 1/2- 1 tab by mouth as needed for sleep [BMN]. Initial call taken by: Lamar Sprinkles, CMA,  February 09, 2009 9:36 AM  Follow-up for Phone Call        OK x5 Follow-up by: Tresa Garter MD,  February 09, 2009 1:21 PM  Additional Follow-up for Phone Call Additional follow up Details #1::        phoned to pharmacy. Patient notified. Additional Follow-up by: Lucious Groves,  February 09, 2009 2:55 PM    Prescriptions: AMBIEN 10 MG TABS (ZOLPIDEM TARTRATE) 1/2- 1 tab by mouth as needed for sleep Brand medically necessary #15 x 5   Entered by:   Lucious Groves   Authorized by:   Tresa Garter MD   Signed by:   Lucious Groves on 02/09/2009   Method used:   Telephoned to ...       [facility] (retail)       [address]       [place], [place]  [id number]       Ph: [phone]       Fax: [phone]   RxID:   [id number] AMBIEN 10 MG TABS (ZOLPIDEM TARTRATE) 1/2- 1 tab by mouth as needed for sleep Brand medically necessary #15 x 5   Entered by:   Lucious Groves   Authorized by:   Tresa Garter MD   Signed by:   Lucious Groves on 02/09/2009   Method used:   Printed then faxed to ...       [facility] (retail)       [address]       [place], [place]  [id number]       Ph: [phone]       Fax: [phone]   RxID:   [id number]

## 2010-02-19 NOTE — Progress Notes (Signed)
Summary: Remus Loffler request  Phone Note Call from Patient Call back at [facility] Phone [phone]   Summary of Call: Patient left message on triage that she cannot take the sleeping pill that was given to her. Per the patient it makes her tongue numb. Please change it back to Ambien. Is this ok, please advise. Initial call taken by: Lucious Groves,  May 02, 2009 4:26 PM  Follow-up for Phone Call        ok x Ambien 10 mg w/3 ref Follow-up by: Tresa Garter MD,  May 03, 2009 1:05 PM    New/Updated Medications: AMBIEN 10 MG TABS (ZOLPIDEM TARTRATE) 1 tab at bedtime Prescriptions: AMBIEN 10 MG TABS (ZOLPIDEM TARTRATE) 1 tab at bedtime  #30 x 3   Entered by:   Ami Bullins CMA   Authorized by:   Tresa Garter MD   Signed by:   Bill Salinas CMA on 05/03/2009   Method used:   Telephoned to ...       [facility] (retail)       [address]       [place], [place]  [address]       Ph: [phone]       Fax: [phone]   RxID:   [id number]

## 2010-02-19 NOTE — Assessment & Plan Note (Signed)
Summary: DIZZY/ SINUS/ NAUSEA /NWS   Vital Signs:  Patient profile:   77 year old female Height:      61 inches Weight:      94 pounds BMI:     17.83 Temp:     97.5 degrees F oral Pulse rate:   80 / minute Pulse rhythm:   regular Resp:     16 per minute BP sitting:   130 / 90  (left arm) Cuff size:   regular  Vitals Entered By: Lanier Prude, CMA(AAMA) (September 19, 2009 11:18 AM) CC: nausea/dizziness X 2 days Is Patient Diabetic? No   Primary Care Rosana Farnell:  Dorathy Daft, MD  CC:  nausea/dizziness X 2 days.  History of Present Illness: C/o sour burps x 2d. No diarrhea. Nausea is present. No fever. C/o abd discomfort. No CP, no SOB.  Current Medications (verified): 1)  Claritin 10 Mg  Tabs (Loratadine) .[name].. 1 By Mouth Once Daily 2)  Vitamin D3 1000 Unit  Tabs (Cholecalciferol) .[name].. 1 By Mouth Daily 3)  Omeprazole 20 Mg Cpdr (Omeprazole) .... One By Mouth Daily 4)  Spironolactone 25 Mg Tabs (Spironolactone) .[name].. 1 By Mouth Qam For Blood Pressure 5)  Ambien 10 Mg Tabs (Zolpidem Tartrate) .[name].. 1 Tab At Bedtime 6)  Ranitidine Hcl 150 Mg Tabs (Ranitidine Hcl) .[name].. 1 By Mouth Two Times A Day For Indigestion 7)  Klor-Con M10 10 Meq Cr-Tabs (Potassium Chloride Crys Cr) .[name].. 1 By Mouth Qd  Allergies (verified): 1)  ! Pcn 2)  ! Sulfa 3)  ! 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  The patient denies fever, chest pain, dyspnea on exertion, and abdominal pain.    Physical Exam  General:  Well-developed,well-nourished,in no acute distress; alert,appropriate and  cooperative throughout examination. Looks younger than her age Nose:  External nasal examination shows no deformity or inflammation. Nasal mucosa are pink and moist without lesions or exudates. Mouth:  Oral mucosa and oropharynx without lesions or exudates.  Teeth in good repair. Neck:  Cervical spine is slightely tender to palpation over paraspinal muscles and with the ROM  Lungs:  CTA Heart:  RRR Abdomen:  soft, non-tender, and no masses.   Msk:  LS NT, knees NT B Extremities:  No clubbing, cyanosis, edema, or deformity noted with normal full range of motion of all joints.   Neurologic:  No cranial nerve deficits noted. Station and gait are normal. Plantar reflexes are down-going bilaterally. DTRs are symmetrical throughout. Sensory, motor and coordinative functions appear intact. Skin:  Two 1 cm abrasion at the bottom chin Psych:  Cognition and judgment appear intact. Alert and cooperative with normal attention span and concentration. No apparent delusions, illusions, hallucinations   Impression & Recommendations:  Problem # 1:  GASTRITIS (ICD-535.50) Assessment New See "Patient Instructions".  The following medications were removed from the medication list:    Omeprazole 20 Mg Cpdr (Omeprazole) ..... One by mouth daily HOLD    Ranitidine Hcl 150 Mg Tabs (Ranitidine hcl) .[name]... 1 by mouth two times a day for indigestion  Discussed use of medication, as well as lifestyle changes.   Problem # 2:  ABDOMINAL PAIN, EPIGASTRIC (ICD-789.06)  Assessment: New  Refused EKG  Orders: TLB-BMP (Basic Metabolic Panel-BMET) (80048-METABOL) TLB-Hepatic/Liver Function Pnl (80076-HEPATIC) TLB-Sedimentation Rate (ESR) (85652-ESR) TLB-Lipase (83690-LIPASE) TLB-CBC Platelet - w/Differential (85025-CBCD) TLB-Udip ONLY (81003-UDIP)  Problem # 3:  HYPOKALEMIA (ICD-276.8) Assessment: Comment Only Risks of noncompliance with treatment discussed. Compliance encouraged.   Problem # 4:  HYPERTENSION  (ICD-401.9) Assessment: Unchanged  Her updated medication list for this problem includes:    Spironolactone 25 Mg Tabs (Spironolactone) .[name]... 1 by mouth qam for blood pressure  BP today: 130/90 Prior BP: 120/80 (09/05/2009)  Labs Reviewed: K+: 3.3 (06/29/2009) Creat: : 1.0 (06/29/2009)     Complete Medication List: 1)  Claritin 10 Mg Tabs (Loratadine) .[name].. 1 by mouth once daily 2)  Vitamin D3 1000 Unit Tabs (Cholecalciferol) .[name].. 1 by mouth daily 3)  Spironolactone 25 Mg Tabs (Spironolactone) .[name].. 1 by mouth qam for blood pressure 4)  Ambien 10 Mg Tabs (Zolpidem tartrate) .[name].. 1 tab at bedtime 5)  Klor-con M10 10 Meq Cr-tabs (Potassium chloride crys cr) .[name].. 1 by mouth qd 6)  Metamucil 0.52 Gm Caps (Psyllium) .[name].. 1 by mouth two times a day for constipation  Patient Instructions: 1)  Take Nexium 1 a day x 1-2 wks 2)  Call if you are not better in a reasonable amount of time or if worse.  Prescriptions: METAMUCIL 0.52 GM CAPS (PSYLLIUM) 1 by mouth two times a day for constipation  #100 x 3   Entered and Authorized by:   Tresa Garter MD   Signed by:   Tresa Garter MD on 09/19/2009   Method used:   Electronically to        [facility] (retail)       [address]       [place], [place]  [id number]       Ph: [phone]       Fax: [phone]   RxID:   [id number]

## 2010-02-19 NOTE — Progress Notes (Signed)
Summary: Ambien  Phone Note Refill Request Message from:  Fax from Pharmacy on April 12, 2009 2:20 PM  Refills Requested: Medication #1:  Ambien 10mg  This was removed from med list, please advise.  Initial call taken by: Lucious Groves,  April 12, 2009 2:21 PM  Follow-up for Phone Call        She was given Triazolam instead Follow-up by: Tresa Garter MD,  April 13, 2009 1:26 PM  Additional Follow-up for Phone Call Additional follow up Details #1::        Pharm informed Additional Follow-up by: Lamar Sprinkles, CMA,  April 16, 2009 9:16 AM

## 2010-02-19 NOTE — Letter (Signed)
Summary: Call A Nurse  Call A Nurse   Imported By: Sherian Rein 02/21/2009 09:44:09  _____________________________________________________________________  External Attachment:    Type:   Image     Comment:   External Document

## 2010-02-19 NOTE — Progress Notes (Signed)
Summary: MEDICATION  Phone Note Call from Patient   Summary of Call: Patient is requesting a call regarding medication she has "piled up" at her house.  Initial call taken by: Lamar Sprinkles, CMA,  December 28, 2009 9:56 AM  Follow-up for Phone Call        Pt has lots of samples, advised her to bring them to office & we will dispose Follow-up by: Lamar Sprinkles, CMA,  December 28, 2009 11:54 AM

## 2010-02-19 NOTE — Progress Notes (Signed)
Summary: Rf Ambien  Phone Note Refill Request Message from:  Fax from Pharmacy  Refills Requested: Medication #1:  AMBIEN 10 MG TABS 1 tab at bedtime   Dosage confirmed as above?Dosage Confirmed   Supply Requested: 30   Last Refilled: 10/09/2009  Method Requested: Telephone to Pharmacy Next Appointment Scheduled: 10-31-09 Initial call taken by: Lanier Prude, [facility]),  October 30, 2009 11:53 AM  Follow-up for Phone Call        ok x6 Follow-up by: Tresa Garter MD,  October 30, 2009 5:30 PM  Additional Follow-up for Phone Call Additional follow up Details #1::        Rx called to pharmacy Additional Follow-up by: Lanier Prude, [facility]),  October 31, 2009 11:43 AM    Prescriptions: AMBIEN 10 MG TABS (ZOLPIDEM TARTRATE) 1 tab at bedtime  #30 x 5   Entered by:   Lanier Prude, [facility])   Authorized by:   Tresa Garter MD   Signed by:   Lanier Prude, CMA(AAMA) on 10/31/2009   Method used:   Telephoned to ...       [facility] (retail)       [address]       [place], [place]  [id number]       Ph: [phone]       Fax: [phone]   RxID:   [id number]

## 2010-02-19 NOTE — Progress Notes (Signed)
Summary: CALL A NURSE  Phone Note From Other Clinic   Summary of Call: CALL A NURSE: Pt called c/o hip pain from fall on 1/26. Advised by RN to go to UC or call office when Sat clinic opened at 9 for apt.  Initial call taken by: Lamar Sprinkles, CMA,  February 19, 2009 5:51 PM

## 2010-02-19 NOTE — Assessment & Plan Note (Signed)
Summary: 4 MO ROV /NWS   Vital Signs:  Patient profile:   77 year old female Weight:      97 pounds Temp:     98.2 degrees F oral Pulse rate:   83 / minute BP sitting:   120 / 80  (left arm)  Vitals Entered By: Tora Perches (January 23, 2009 2:43 PM) CC: f/u Is Patient Diabetic? No   Primary Care Provider:  Dorathy Daft, MD  CC:  f/u.  History of Present Illness: The patient presents for a follow up of IBS, back pain, anxiety.   Preventive Screening-Counseling & Management  Alcohol-Tobacco     Smoking Status: never  Current Medications (verified): 1)  Claritin 10 Mg  Tabs (Loratadine) .[name].. 1 By Mouth Once Daily 2)  Hydrochlorothiazide 12.5 Mg  Tabs (Hydrochlorothiazide) .... Take 1 Tab  By Mouth Every Morning 3)  Vitamin D3 1000 Unit  Tabs (Cholecalciferol) .[name].. 1 By Mouth Daily 4)  Omeprazole 20 Mg Cpdr (Omeprazole) .... One By Mouth Daily 5)  Ambien 10 Mg Tabs (Zolpidem Tartrate) .... 1/2- 1 Tab By Mouth As Needed For Sleep  Allergies: 1)  ! Pcn 2)  ! Sulfa 3)  ! 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    Physical Exam  General:  Well-developed,well-nourished,in no acute distress; alert,appropriate and cooperative throughout examination Nose:  WNL Mouth:  Oral mucosa and oropharynx without lesions or exudates.  Teeth in good repair. Lungs:   Normal respiratory effort, chest expands symmetrically. Lungs are clear to auscultation, no crackles or wheezes. Heart:  Normal rate and regular rhythm. S1 and S2 normal without gallop, murmur, click, rub or other extra sounds. Abdomen:  Bowel sounds positive,abdomen soft and non-tender without masses, organomegaly or hernias noted. Skin:  Red maculas 1.5 cm x 3 under L breast Psych:  Cognition and judgment appear intact. Alert and cooperative with normal attention span and concentration. No apparent delusions, illusions, hallucinations   Impression & Recommendations:  Problem # 1:  GERD (ICD-530.81) Assessment Unchanged  Her updated medication list for this problem includes:    Omeprazole 20 Mg Cpdr (Omeprazole) ..... One by mouth daily  Problem # 2:  INSOMNIA-SLEEP DISORDER-UNSPEC (ICD-780.52) Assessment: Improved  Her updated medication list for this problem includes:    Ambien 10 Mg Tabs (Zolpidem tartrate) .[name]... 1/2- 1 tab by mouth as needed for sleep  Problem # 3:  LOW BACK PAIN (ICD-724.2) Assessment: Unchanged Geta a UA  Problem # 4:  HYPERTENSION (ICD-401.9) Assessment: Improved  Her updated medication list for this problem includes:    Hydrochlorothiazide 12.5 Mg Tabs (Hydrochlorothiazide) .[name]... Take 1 tab  by mouth every morning  BP today: 120/80 Prior BP: 144/82 (09/18/2008)  Labs Reviewed: K+: 4.3 (10/19/2007) Creat: : 1.1 (10/19/2007)     Complete Medication List: 1)  Claritin 10 Mg Tabs (Loratadine) .[name].. 1 by mouth once daily 2)  Hydrochlorothiazide 12.5 Mg Tabs (Hydrochlorothiazide) .... Take 1 tab  by mouth every morning 3)  Vitamin D3 1000 Unit Tabs (Cholecalciferol) .Marland KitchenMarland KitchenMarland Kitchen 1  by mouth daily 4)  Omeprazole 20 Mg Cpdr (Omeprazole) .... One by mouth daily 5)  Ambien 10 Mg Tabs (Zolpidem tartrate) .... 1/2- 1 tab by mouth as needed for sleep  Other Orders: TLB-Udip w/ Micro (81001-URINE)  Patient Instructions: 1)  Please schedule a follow-up appointment in  3 months. 2)  BMP prior to visit, ICD-9: 3)  Hepatic Panel prior to visit, ICD-9:401.1  995.20 4)  TSH prior to visit, ICD-9:  Contraindications/Deferment of Procedures/Staging:    Treatment: Flu Shot    Contraindication: other     Test/Procedure: Pneumovax vaccine    Reason for deferment: patient declined  Prescriptions: OMEPRAZOLE 20 MG CPDR (OMEPRAZOLE) one by mouth daily  #90 x 3   Entered and Authorized by:   Tresa Garter MD   Signed by:   Tresa Garter MD on 01/23/2009   Method used:   Electronically to        [facility] (retail)       [address]       [place], [place]  [id number]       Ph: [phone]       Fax: [phone]   RxID:   [id number]

## 2010-02-19 NOTE — Assessment & Plan Note (Signed)
Summary: fatigue, swelling, stomach discomfort/ per phone note/nws   Vital Signs:  Patient profile:   77 year old female Weight:      97 pounds Temp:     98.1 degrees F oral Pulse rate:   87 / minute BP sitting:   156 / 80  (left arm)  Vitals Entered By: Tora Perches (April 11, 2009 3:49 PM) CC: fatigue  sweating and stomach discomfot Is Patient Diabetic? No   Primary Care Provider:  Dorathy Daft, MD  CC:  fatigue  sweating and stomach discomfot.  History of Present Illness: C/o tight feeling in the stomach and would go up in the chest. It is worse at bedtime. C/o gas and belching x 1-2 mo .  Preventive Screening-Counseling & Management  Alcohol-Tobacco     Smoking Status: never  Current Medications (verified): 1)  Claritin 10 Mg  Tabs (Loratadine) .[name].. 1 By Mouth Once Daily 2)  Hydrochlorothiazide 12.5 Mg  Tabs (Hydrochlorothiazide) .... Take 1 Tab  By Mouth Every Morning 3)  Vitamin D3 1000 Unit  Tabs (Cholecalciferol) .[name].. 1 By Mouth Daily 4)  Omeprazole 20 Mg Cpdr (Omeprazole) .... One By Mouth Daily 5)  Ambien 10 Mg Tabs (Zolpidem Tartrate) .... 1/2- 1 Tab By Mouth As Needed For Sleep  Allergies: 1)  ! Pcn 2)  ! Sulfa 3)  ! 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  The patient denies fever, weight loss, weight gain, dyspnea on exertion, melena, hematochezia, severe indigestion/heartburn, and hematuria.    Physical Exam  General:  Well-developed,well-nourished,in no acute distress;  alert,appropriate and cooperative throughout examination Mouth:  Oral mucosa and oropharynx without lesions or exudates.  Teeth in good repair. Lungs:  Normal respiratory effort, chest expands symmetrically. Lungs are clear to auscultation, no crackles or wheezes. Heart:  Normal rate and regular rhythm. S1 and S2 normal without gallop, murmur, click, rub or other extra sounds. Abdomen:  Bowel sounds positive,abdomen soft and round, non-tender without masses, organomegaly or hernias noted. Msk:  No deformity or scoliosis noted of thoracic or lumbar spine.   Extremities:  No clubbing, cyanosis, edema, or deformity noted with normal full range of motion of all joints.   Neurologic:  No cranial nerve deficits noted. Station and gait are normal. Plantar reflexes are down-going bilaterally. DTRs are symmetrical throughout. Sensory, motor and coordinative functions appear intact. Skin:  Intact without suspicious lesions or rashes Psych:  Cognition and judgment appear intact. Alert and cooperative with normal attention span and concentration. No apparent delusions, illusions, hallucinations   Impression & Recommendations:  Problem # 1:  ABDOMINAL PAIN (ICD-789.00) ? etiol Assessment New  Orders: EKG w/ Interpretation (93000) T-2 View CXR, Same Day (71020.5TC) Radiology Referral (Radiology) abd [place] TLB-BMP (Basic Metabolic Panel-BMET) (80048-METABOL) TLB-CBC Platelet - w/Differential (85025-CBCD) TLB-TSH (Thyroid Stimulating Hormone) (84443-TSH) TLB-Lipase (83690-LIPASE) TLB-Udip ONLY (81003-UDIP) TLB-Hepatic/Liver Function Pnl (80076-HEPATIC)  Problem # 2:  HYPOKALEMIA (ICD-276.8) KCL Orders: Prescription Created Electronically [phone])  Problem # 3:  INSOMNIA-SLEEP DISORDER-UNSPEC (ICD-780.52) Assessment: Deteriorated  The following medications were removed from the medication list:    Ambien 10 Mg Tabs (Zolpidem tartrate) .Marland KitchenMarland KitchenMarland KitchenMarland Kitchen  1/2- 1 tab by mouth as needed for sleep Her updated  medication list for this problem includes:    Triazolam 0.25 Mg Tabs (Triazolam) .[name]... 1 by mouth at bedtime as needed insomnia  Problem # 4:  CHEST WALL PAIN (JXB-147.82) Assessment: Improved  Orders: EKG w/ Interpretation (93000) OK T-2 View CXR, Same Day (71020.5TC) Radiology Referral (Radiology)  Problem # 5:  GERD (ICD-530.81) Assessment: Deteriorated  Her updated medication list for this problem includes:    Omeprazole 20 Mg Cpdr (Omeprazole) ..... One by mouth daily  Complete Medication List: 1)  Claritin 10 Mg Tabs (Loratadine) .[name].. 1 by mouth once daily 2)  Hydrochlorothiazide 12.5 Mg Tabs (Hydrochlorothiazide) .... Take 1 tab  by mouth every morning 3)  Vitamin D3 1000 Unit Tabs (Cholecalciferol) .[name].. 1 by mouth daily 4)  Omeprazole 20 Mg Cpdr (Omeprazole) .... One by mouth daily 5)  Triazolam 0.25 Mg Tabs (Triazolam) .[name].. 1 by mouth at bedtime as needed insomnia 6)  Klor-con M10 10 Meq Cr-tabs (Potassium chloride crys cr) .[name].. 1 by mouth three times a day x 2 wks then 1 by mouth qd  Patient Instructions: 1)  Please schedule a follow-up appointment in 1 month. 2)  Call if you are not better in a reasonable amount of time or if worse. Go to ER if feeling really bad!  3)  BMP prior to visit, ICD-9: in 2 wks Prescriptions: KLOR-CON M10 10 MEQ CR-TABS (POTASSIUM CHLORIDE CRYS CR) 1 by mouth three times a day x 2 wks then 1 by mouth qd  #90 x 3   Entered and Authorized by:   Tresa Garter MD   Signed by:   Tresa Garter MD on 04/11/2009   Method used:   Electronically to        [facility] (retail)       [address]       [place], [place]  [address]       Ph: [phone]       Fax: [phone]   RxID:   [id number] KLOR-CON M10 10 MEQ CR-TABS (POTASSIUM CHLORIDE CRYS CR) 1 by mouth three times a day x 2 wks then 1 by mouth qd  #90 x 3   Entered and Authorized by:   Tresa Garter MD   Signed by:   Tresa Garter MD  on 04/11/2009   Method used:   Print then Give to Patient   RxID:   [id number] OMEPRAZOLE 20 MG CPDR (OMEPRAZOLE) one by mouth daily  #30 x 12   Entered and Authorized by:   Tresa Garter MD   Signed by:   Tresa Garter MD on 04/11/2009   Method used:   Print then Give to Patient   RxID:   [id number] TRIAZOLAM 0.25 MG TABS (TRIAZOLAM) 1 by mouth at bedtime as needed insomnia  #30 x 5   Entered and Authorized by:   Tresa Garter MD   Signed by:   Tresa Garter MD on 04/11/2009   Method used:   Print then Give to Patient   RxID:   [id number]

## 2010-02-19 NOTE — Progress Notes (Signed)
Summary: RESULTS  Phone Note Call from Patient Call back at please call cell [phone]   Caller: Patient Call For: Tresa Garter MD Summary of Call: Pt requesting results of labs, please call her at [phone]. Initial call taken by: Verdell Face,  September 21, 2009 9:05 AM  Follow-up for Phone Call        multiple unsucessful attempts to contact pt Re: recent labs Follow-up by: Lanier Prude, [facility]),  September 21, 2009 11:26 AM  Additional Follow-up for Phone Call Additional follow up Details #1::        No answer again today.  Will mail contact letter. Additional Follow-up by: Lanier Prude, [facility]),  September 26, 2009 11:58 AM

## 2010-02-19 NOTE — Assessment & Plan Note (Signed)
Summary: yearly f/u / #/ cd   Vital Signs:  Patient profile:   77 year old female Height:      61 inches Weight:      95 pounds BMI:     18.01 Temp:     97.4 degrees F oral Pulse rate:   76 / minute Pulse rhythm:   regular Resp:     16 per minute BP sitting:   150 / 70  (left arm) Cuff size:   regular  Vitals Entered By: Lanier Prude, [facility] Gust) (December 20, 2009 9:47 AM) CC: MWV Is Patient Diabetic? No   Primary Care Provider:  Dorathy Daft, MD  CC:  MWV.  History of Present Illness: The patient presents for a preventive health examination  Patient past medical history, social history, and family history reviewed in detail no significant changes.  Patient is physically active. Depression is negative and mood is good. Hearing is normal, and able to perform activities of daily living. Risk of falling is negligible and home safety has been reviewed and is appropriate. Patient has normal  height,  low weight, and nl visual acuity w/glasses. Patient has been counseled on age-appropriate routine health concerns for screening and prevention. Education, counseling done.   Preventive Screening-Counseling & Management  Alcohol-Tobacco     Alcohol drinks/day: 0     Smoking Status: never  Caffeine-Diet-Exercise     Does Patient Exercise: yes  Hep-HIV-STD-Contraception     Dental Visit-last 6 months no     SBE monthly: yes     Sun Exposure-Excessive: no  Safety-Violence-Falls     Seat Belt Use: yes     Helmet Use: n/a     Firearms in the Home: no firearms in the home     Smoke Detectors: yes     Violence in the Home: no risk noted     Sexual Abuse: no     Fall Risk: no  Current Medications (verified): 1)  Claritin 10 Mg  Tabs (Loratadine) .[name].. 1 By Mouth Once Daily 2)  Vitamin D3 1000 Unit  Tabs (Cholecalciferol) .[name].. 1 By Mouth Daily 3)  Spironolactone 25 Mg Tabs (Spironolactone) .[name].. 1 By Mouth Qam For Blood Pressure 4)  Ambien 10 Mg Tabs (Zolpidem Tartrate) .[name].. 1  Tab At Bedtime 5)  Klor-Con M10 10 Meq Cr-Tabs (Potassium Chloride Crys Cr) .[name].. 1 By Mouth Qd 6)  Metamucil 0.52 Gm Caps (Psyllium) .[name].. 1 By Mouth Two Times A Day For Constipation  Allergies (verified): 1)  ! Pcn 2)  ! Sulfa 3)  ! 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  The patient denies anorexia, fever, weight loss, weight gain, vision loss, decreased hearing, hoarseness, chest pain, syncope, dyspnea on exertion, peripheral edema, prolonged cough, headaches, hemoptysis, abdominal pain, melena, hematochezia, severe indigestion/heartburn, hematuria, incontinence, genital sores, muscle weakness, suspicious skin lesions, transient blindness,  depression, unusual weight change, abnormal bleeding, enlarged lymph nodes, angioedema, and breast masses.    Physical Exam  General:  Well-developed,well-nourished,in no acute distress; alert,appropriate and cooperative throughout examination. Looks younger than her age Head:  Normocephalic and atraumatic without obvious abnormalities. No apparent alopecia or balding. Eyes:  No corneal or conjunctival inflammation noted. EOMI. Perrla Ears:  External ear exam shows no significant lesions or deformities.  Otoscopic  examination reveals clear canals, tympanic membranes are intact bilaterally without bulging, retraction, inflammation or discharge. Hearing is grossly normal bilaterally. Nose:  External nasal examination shows no deformity or inflammation. Nasal mucosa are pink and moist without lesions or exudates. Mouth:  Oral mucosa and oropharynx without lesions or exudates.  Teeth in good repair. Neck:  Cervical spine is slightely tender to palpation over paraspinal muscles and with the ROM  Lungs:  CTA Heart:  RRR Abdomen:  soft, non-tender, and no masses.   Msk:  LS NT, knees NT B Extremities:  No clubbing, cyanosis, edema, or deformity noted with normal full range of motion of all joints.   Neurologic:  No cranial nerve deficits noted. Station and gait are normal. Plantar reflexes are down-going bilaterally. DTRs are symmetrical throughout. Sensory, motor and coordinative functions appear intact. Skin:  Two 1 cm abrasion at the bottom chin Psych:  Cognition and judgment appear intact. Alert and cooperative with normal attention span and concentration. No apparent delusions, illusions, hallucinations   Impression & Recommendations:  Problem # 1:  HEALTH MAINTENANCE EXAM (ICD-V70.0) Assessment New Overall doing well, age appropriate education and counseling updated and referral for appropriate preventive services done unless declined, immunizations up to date or declined, diet counseling done if overweight, urged to quit smoking if smokes, most recent labs reviewed and current ordered if appropriate, ecg reviewed or declined (interpretation per ECG scanned in the EMR if done); information regarding Medicare Preventation requirements given if appropriate.  Labs ordered Declined all shots Mammo q 2 years aviced and declined  Orders: TLB-BMP (Basic Metabolic Panel-BMET) (80048-METABOL) TLB-CBC Platelet - w/Differential (85025-CBCD) TLB-Hepatic/Liver Function Pnl (80076-HEPATIC) TLB-Lipid Panel  (80061-LIPID) TLB-TSH (Thyroid Stimulating Hormone) (84443-TSH) TLB-Udip ONLY (81003-UDIP)  Complete Medication List: 1)  Claritin 10 Mg Tabs (Loratadine) .[name].. 1 by mouth once daily 2)  Vitamin D3 1000 Unit Tabs (Cholecalciferol) .[name].. 1 by mouth daily 3)  Spironolactone 25 Mg Tabs (Spironolactone) .[name].. 1 by mouth qam for blood pressure 4)  Klor-con M10 10 Meq Cr-tabs (Potassium chloride crys cr) .[name].. 1 by mouth qd 5)  Metamucil 0.52 Gm Caps (Psyllium) .[name].. 1 by mouth two times a day for constipation 6)  Silenor 6 Mg Tabs (Doxepin hcl) .... 1/2 or 1 tab at hs as needed insomnia  Patient Instructions: 1)  Please schedule a follow-up appointment in 6 months. Prescriptions: KLOR-CON M10 10 MEQ CR-TABS (POTASSIUM CHLORIDE CRYS CR) 1 by mouth qd  #10 x 3   Entered and Authorized by:   Tresa Garter MD   Signed by:   Tresa Garter MD on 12/20/2009   Method used:   Print then Give to Patient   RxID:   [id number] SPIRONOLACTONE 25 MG TABS (SPIRONOLACTONE) 1 by mouth qam for blood pressure  #30 x 12   Entered and Authorized by:   Tresa Garter MD   Signed by:   Tresa Garter MD on 12/20/2009   Method used:   Print then Give to Patient   RxID:   [id number] VITAMIN D3 1000 UNIT  TABS (CHOLECALCIFEROL) 1  by mouth daily  #100 x 3   Entered and Authorized by:   Tresa Garter MD   Signed by:   Tresa Garter MD on 12/20/2009   Method used:   Print then Give to Patient   RxID:   [id number] CLARITIN 10 MG  TABS (LORATADINE) 1 by mouth once daily  #90 x 3   Entered and Authorized by:   Tresa Garter MD   Signed by:   Tresa Garter MD on 12/20/2009   Method used:   Print then Give to Patient   RxID:   [id number] SILENOR 6 MG TABS (DOXEPIN HCL) 1/2 or 1 tab at hs as needed insomnia Brand medically necessary #30 x 6   Entered and Authorized by:   Tresa Garter MD   Signed by:   Tresa Garter MD on 12/20/2009   Method  used:   Print then Give to Patient   RxID:   [id number]    Orders Added: 1)  TLB-BMP (Basic Metabolic Panel-BMET) [80048-METABOL] 2)  TLB-CBC Platelet - w/Differential [85025-CBCD] 3)  TLB-Hepatic/Liver Function Pnl [80076-HEPATIC] 4)  TLB-Lipid Panel [80061-LIPID] 5)  TLB-TSH (Thyroid Stimulating Hormone) [84443-TSH] 6)  TLB-Udip ONLY [81003-UDIP] 7)  Est. Patient 65& > [01027]    Contraindications/Deferment of Procedures/Staging:    Test/Procedure: FLU VAX    Reason for deferment: patient declined

## 2010-02-19 NOTE — Assessment & Plan Note (Signed)
Summary: 2 mo rov /nws #   Vital Signs:  Patient profile:   77 year old female Height:      61 inches Weight:      96.75 pounds BMI:     18.35 O2 Sat:      94 % on Room air Temp:     97.0 degrees F oral Pulse rate:   86 / minute BP sitting:   130 / 70  (left arm) Cuff size:   regular  Vitals Entered By: Lucious Groves (July 04, 2009 2:20 PM)  O2 Flow:  Room air CC: 2 MO RTN OV./KB Is Patient Diabetic? No Pain Assessment Patient in pain? no        Primary Care Provider:  Dorathy Daft, MD  CC:  2 MO RTN OV./KB.  History of Present Illness: F/u IBS, GERD, anxiety  Current Medications (verified): 1)  Claritin 10 Mg  Tabs (Loratadine) .[name].. 1 By Mouth Once Daily 2)  Vitamin D3 1000 Unit  Tabs (Cholecalciferol) .[name].. 1 By Mouth Daily 3)  Omeprazole 20 Mg Cpdr (Omeprazole) .... One By Mouth Daily 4)  Spironolactone 25 Mg Tabs (Spironolactone) .[name].. 1 By Mouth Qam For Blood Pressure 5)  Ambien 10 Mg Tabs (Zolpidem Tartrate) .[name].. 1 Tab At Bedtime  Allergies (verified): 1)  ! Pcn 2)  ! Sulfa 3)  ! Cipro 4)  Hydrochlorothiazide  Past History:  Past Medical History: Last updated: 09/12/2006 IBS Chronic Constipation Allergic rhinitis Anxiety Congestive heart failure Depression Diverticulitis, hx of GERD Hyperlipidemia Low back pain Colonic polyps, hx of  Past Surgical History: Last updated: 03/26/2007 Appendectomy Tubal ligation Cataract sugery  Social History: Last updated: 07/07/2007 Occupation:sitter Single Never Smoked Alcohol use-no  Review of Systems  The patient denies fever, dyspnea on exertion, peripheral edema, and abdominal pain.    Physical Exam  General:  Well-developed,well-nourished,in no acute distress; alert,appropriate and cooperative throughout examination. Looks younger than her age Nose:  WNL Mouth:  Oral mucosa and oropharynx without lesions or exudates.  Teeth in good repair. Lungs:  Normal respiratory effort, chest expands  symmetrically. Lungs are clear to auscultation, no crackles or wheezes. Heart:  Normal rate and regular rhythm. S1 and S2 normal without gallop, murmur, click, rub or other extra sounds. Abdomen:  Bowel sounds positive,abdomen soft and round, non-tender without masses, organomegaly or hernias noted. Msk:  No deformity or scoliosis noted of thoracic or lumbar spine.   Extremities:  No clubbing, cyanosis, edema, or deformity noted with normal full range of motion of all joints.   Neurologic:  No cranial nerve deficits noted. Station and gait are normal. Plantar reflexes are down-going bilaterally. DTRs are symmetrical throughout. Sensory, motor and coordinative functions appear intact.   Impression & Recommendations:  Problem # 1:  HYPERTENSION (ICD-401.9) Assessment Unchanged  Her updated medication list for this problem includes:    Spironolactone 25 Mg Tabs (Spironolactone) .[name]... 1 by mouth qam for blood pressure  BP today: 130/70 Prior BP: 120/80 (04/24/2009)  Labs Reviewed: K+: 3.3 (06/29/2009) Creat: : 1.0 (06/29/2009)     Problem # 2:  EDEMA (ICD-782.3) Assessment: Unchanged  Her updated medication list for this problem includes:    Spironolactone 25 Mg Tabs (Spironolactone) .[name]... 1 by mouth qam for blood pressure  Problem # 3:  DEPRESSION (ICD-311) Assessment: Comment Only  Discussed  Problem # 4:  GASTRITIS (ICD-535.50) Assessment: Unchanged  Her updated medication list for this problem includes:    Omeprazole 20 Mg Cpdr (Omeprazole) ..... One by mouth  daily    Ranitidine Hcl 150 Mg Tabs (Ranitidine hcl) .[name]... 1 by mouth two times a day for indigestion  Complete Medication List: 1)  Claritin 10 Mg Tabs (Loratadine) .[name].. 1 by mouth once daily 2)  Vitamin D3 1000 Unit Tabs (Cholecalciferol) .[name].. 1 by mouth daily 3)  Omeprazole 20 Mg Cpdr (Omeprazole) .... One by mouth daily 4)  Spironolactone 25 Mg Tabs (Spironolactone) .[name].. 1 by mouth qam for blood pressure 5)   Ambien 10 Mg Tabs (Zolpidem tartrate) .[name].. 1 tab at bedtime 6)  Ranitidine Hcl 150 Mg Tabs (Ranitidine hcl) .[name].. 1 by mouth two times a day for indigestion 7)  Klor-con M10 10 Meq Cr-tabs (Potassium chloride crys cr) .[name].. 1 by mouth qd  Other Orders: Prescription Created Electronically [phone])  Patient Instructions: 1)  Please schedule a follow-up appointment in 3 months well w/labs. 2)  Try Align 1 a day for gas Prescriptions: KLOR-CON M10 10 MEQ CR-TABS (POTASSIUM CHLORIDE CRYS CR) 1 by mouth qd  #10 x 3   Entered and Authorized by:   Tresa Garter MD   Signed by:   Tresa Garter MD on 07/04/2009   Method used:   Print then Give to Patient   RxID:   [id number] RANITIDINE HCL 150 MG TABS (RANITIDINE HCL) 1 by mouth two times a day for indigestion  #60 x 12   Entered and Authorized by:   Tresa Garter MD   Signed by:   Tresa Garter MD on 07/04/2009   Method used:   Electronically to        [facility] (retail)       [address]       [place], [place]  [id number]       Ph: [phone]       Fax: [phone]   RxID:   [id number]

## 2010-02-19 NOTE — Letter (Signed)
Summary: Generic Letter  [facility]  [address] [place], [place] [address]   Phone: [phone]  Fax: [phone]    09/26/2009  Lindsey Dickson [address] [place], [place]  [address]  Dear Ms. Hudler,   We have been trying to contact you regarding your recent lab results.  The results were ok except your potassium is low. Please make sure you are taking your potassium supplement. Call our office at [phone] if you have any questions.    Sincerely,   Lanier Prude, [facility]) for Dr. Posey Rea

## 2010-02-21 NOTE — Progress Notes (Signed)
  Phone Note Other Incoming   Caller: pt Summary of Call: pt states that she just wants to be on medication for fluid. She does not want the combination of fluid and bp. She states she can control her BP. Please Advise Initial call taken by: Ami Bullins CMA,  January 16, 2010 8:28 AM  Follow-up for Phone Call        Spironolactone is just a fluid pill Follow-up by: Tresa Garter MD,  January 16, 2010 1:28 PM  Additional Follow-up for Phone Call Additional follow up Details #1::        pt advised and expressed understanding Additional Follow-up by: Margaret Pyle, CMA,  January 16, 2010 4:04 PM

## 2010-02-21 NOTE — Assessment & Plan Note (Signed)
Summary: fatigue/bloating/lb   Vital Signs:  Patient profile:   77 year old female Height:      61 inches Weight:      94 pounds BMI:     17.83 Temp:     98.5 degrees F oral Pulse rate:   76 / minute Pulse rhythm:   regular Resp:     16 per minute BP sitting:   110 / 60  (left arm) Cuff size:   regular  Vitals Entered By: Lanier Prude, CMA(AAMA) (February 15, 2010 1:29 PM) CC: fatigue, hoarsness, abdominal bloating Is Patient Diabetic? No Comments pt is not taking spironolactone regularly and is not taking metamucil or Silenor   Primary Care Provider:  Dorathy Daft, MD  CC:  fatigue, hoarsness, and abdominal bloating.  History of Present Illness: C/o thyroid discomfort, hoarsness and abd burping and bloating with GERD symptoms - she is somewhat vague about her symptoms   Current Medications (verified): 1)  Claritin 10 Mg  Tabs (Loratadine) .[name].. 1 By Mouth Once Daily 2)  Vitamin D3 1000 Unit  Tabs (Cholecalciferol) .[name].. 1 By Mouth Daily 3)  Spironolactone 25 Mg Tabs (Spironolactone) .[name].. 1 By Mouth Qam For Blood Pressure 4)  Klor-Con M10 10 Meq Cr-Tabs (Potassium Chloride Crys Cr) .[name].. 1 By Mouth Qd 5)  Metamucil 0.52 Gm Caps (Psyllium) .[name].. 1 By Mouth Two Times A Day For Constipation 6)  Silenor 6 Mg Tabs (Doxepin Hcl) .... 1/2 or 1 Tab At [facility] As Needed Insomnia 7)  Milk of Magnesia 400 Mg/55ml Susp (Magnesium Hydroxide) .... Take One and A Half Tablespoon As Needed 8)  Ambien 10 Mg Tabs (Zolpidem Tartrate) .[name].. 1-2 or 1 By Mouth At Bedtime As Needed  Allergies (verified): 1)  ! Pcn 2)  ! Sulfa 3)  ! 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    Physical Exam  General:  Well-developed,well-nourished,in no acute distress; alert,appropriate and cooperative throughout examination. Looks younger than her age Mouth:  Oral mucosa and oropharynx without lesions or exudates.  Teeth in good repair. Neck:  Cervical spine is slightely tender to palpation over paraspinal muscles and with the ROM  Lungs:  CTA Heart:  RRR Abdomen:  soft, non-tender, and no masses.   Msk:  LS NT, knees NT B Extremities:  WNL B Neurologic:  No cranial nerve deficits noted. Station and gait are normal. Plantar reflexes are down-going bilaterally. DTRs are symmetrical throughout. Sensory, motor and coordinative functions appear intact. Skin:  Two 1 cm abrasion at the bottom chin Psych:  Cognition and judgment appear intact. Alert and cooperative with normal attention span and concentration. No apparent delusions, illusions, hallucinations   Impression & Recommendations:  Problem # 1:  NECK PAIN (ICD-723.1)/throat discomfort - poss due to GERD Assessment New The exam  is nl GI consult if not better  Problem # 2:  GERD (ICD-530.81) Assessment: Deteriorated  Her updated medication list for this problem includes:    Nexium 40 Mg Cpdr (Esomeprazole magnesium) .[name]... 1 by mouth qam ( medically necessary)  Problem # 3:  ABDOMINAL BLOATING (ICD-787.3) Assessment: Unchanged Flagyl/Align  Problem # 4:  HYPERTENSION (ICD-401.9) Assessment: Improved  Her updated medication list for this problem includes:    Spironolactone 25 Mg Tabs (Spironolactone) .[name]... 1 by mouth qam for blood pressure  BP today: 110/60 Prior BP: 150/70 (12/20/2009)  Labs Reviewed: K+: 4.0 (12/20/2009) Creat: : 0.9 (12/20/2009)   Chol: 279 (12/20/2009)   HDL: 65.20 (12/20/2009)   TG: 169.0 (12/20/2009)  Complete Medication  List: 1)  Claritin 10 Mg Tabs (Loratadine) .[name].. 1 by mouth once daily 2)  Vitamin D3 1000 Unit Tabs (Cholecalciferol) .[name].. 1 by mouth daily 3)  Spironolactone 25 Mg Tabs (Spironolactone) .[name].. 1 by mouth qam for blood pressure 4)  Klor-con M10 10 Meq Cr-tabs (Potassium chloride crys cr) .[name].. 1 by mouth qd 5)  Metamucil 0.52 Gm Caps (Psyllium) .[name].. 1 by mouth two times a day for constipation 6)  Silenor 6 Mg Tabs (Doxepin hcl) .... 1/2 or 1 tab at hs as needed insomnia 7)  Milk of Magnesia 400 Mg/27ml Susp (Magnesium hydroxide) .... Take one and a half tablespoon as needed 8)  Ambien 10 Mg Tabs (Zolpidem tartrate) .[name].. 1-2 or 1 by mouth at bedtime as needed 9)  Nexium 40 Mg Cpdr (Esomeprazole magnesium) .[name].. 1 by mouth qam ( medically necessary) 10)  Metronidazole 250 Mg Tabs (Metronidazole) .[name].. 1 by mouth qid 11)  Align Caps (Probiotic product) .[name].. 1 by mouth once daily for your intesinal flora restoraion (after you finished metronidazole)  Patient Instructions: 1)  Please schedule a follow-up appointment in 2-3 months. Prescriptions: ALIGN  CAPS (PROBIOTIC PRODUCT) 1 by mouth once daily for your intesinal flora restoraion (after you finished Metronidazole)  #30 x 1   Entered and Authorized by:   Tresa Garter MD   Signed by:   Tresa Garter MD on 02/15/2010   Method used:   Print then Give to Patient   RxID:   [id number] METRONIDAZOLE 250 MG TABS (METRONIDAZOLE) 1 by mouth qid  #40 x 1   Entered and Authorized by:   Tresa Garter MD   Signed by:   Tresa Garter MD on 02/15/2010   Method used:   Print then Give to Patient   RxID:   [id number] NEXIUM 40 MG CPDR (ESOMEPRAZOLE MAGNESIUM) 1 by mouth qam ( medically necessary)  #30 x 12   Entered and Authorized by:   Tresa Garter MD   Signed by:   Tresa Garter MD on 02/15/2010   Method used:   Print then Give to Patient   RxID:   [id number]    Orders Added: 1)  Est. Patient Level  IV [01027]

## 2010-02-26 ENCOUNTER — Ambulatory Visit: Payer: Self-pay | Admitting: Internal Medicine

## 2010-02-27 NOTE — Progress Notes (Signed)
Summary: ?'s  Phone Note Call from Patient   Summary of Call: 1. Pt has not taken any ambien since Friday - felt that it was cause of her stomach upset. Pt has been taking ambien for approx 7 years. I advised pt to try to take her med tonight and I would call her tomorrow to see how her stomach was feeling - pt agreed.  2. Pt was told by her pharmacist that spironolactone was not a good medicine for BP control. Advised her I would check and see what Dr Posey Rea felt about this.  Initial call taken by: Lamar Sprinkles, CMA,  February 19, 2010 6:56 PM  Follow-up for Phone Call        Agree re Ambien Her last BP was 110/60 -  Spironolactone is a good BP medicine Follow-up by: Tresa Garter MD,  February 19, 2010 9:01 PM  Additional Follow-up for Phone Call Additional follow up Details #1::        Pt informed, she feels good this am - see other phone note, transferred to scheduler for f/u office visit.  Additional Follow-up by: Lamar Sprinkles, CMA,  February 20, 2010 9:16 AM

## 2010-02-27 NOTE — Progress Notes (Signed)
Summary: MRI?  Phone Note Call from Patient   Summary of Call: Pt left vm - she wants Dr Posey Rea to look at MRI from ER.  Initial call taken by: Lamar Sprinkles, CMA,  February 18, 2010 9:39 AM  Follow-up for Phone Call        I do not see it in either office or hospital EMR Follow-up by: Tresa Garter MD,  February 18, 2010 6:08 PM  Additional Follow-up for Phone Call Additional follow up Details #1::        Spoke w/pt - She went to ER Sunday night b/c she felt worse than she did Friday. Pt talked to ER MD about a knot on the back of her head that is constantly sore to touch. She was advised to go talk w/PCP about having an MRI of her head for eval. Additional Follow-up by: Lamar Sprinkles, CMA,  February 19, 2010 7:03 PM    Additional Follow-up for Phone Call Additional follow up Details #2::    Come for OV - I'll check it. She was not c/o a knot to me... Follow-up by: Tresa Garter MD,  February 19, 2010 8:58 PM  Additional Follow-up for Phone Call Additional follow up Details #3:: Details for Additional Follow-up Action Taken: Pt informed, transferred to scheduler for office visit  Additional Follow-up by: Lamar Sprinkles, CMA,  February 20, 2010 9:15 AM

## 2010-04-10 ENCOUNTER — Ambulatory Visit: Payer: Medicare Other | Admitting: *Deleted

## 2010-04-10 VITALS — BP 110/68

## 2010-04-10 DIAGNOSIS — I10 Essential (primary) hypertension: Secondary | ICD-10-CM

## 2010-04-10 NOTE — Progress Notes (Signed)
  Subjective:    Patient ID: Lindsey Dickson, female    DOB: 06/06/1920, 77 y.o.   MRN: [id number]  HPI    Review of Systems     Objective:   Physical Exam        Assessment & Plan:   

## 2010-04-10 NOTE — Progress Notes (Deleted)
  Subjective:    Patient ID: Lindsey Dickson, female    DOB: Oct 21, 1920, 77 y.o.   MRN: [id number]  HPI    Review of Systems     Objective:   Physical Exam        Assessment & Plan:

## 2010-04-10 NOTE — Progress Notes (Signed)
Pt made apt for BP check today b/c she was concern from low bp readings on her home machine. She forgot to bring machine today but BP this am was 120/35. Bp this afternoon was good. Pt advised to continue with current meds an bring in hm machine at next ov to confirm if it was accurate.

## 2010-05-14 ENCOUNTER — Encounter: Payer: Self-pay | Admitting: Internal Medicine

## 2010-05-14 ENCOUNTER — Ambulatory Visit (INDEPENDENT_AMBULATORY_CARE_PROVIDER_SITE_OTHER): Payer: Medicare Other | Admitting: Internal Medicine

## 2010-05-14 DIAGNOSIS — E785 Hyperlipidemia, unspecified: Secondary | ICD-10-CM

## 2010-05-14 DIAGNOSIS — R143 Flatulence: Secondary | ICD-10-CM

## 2010-05-14 DIAGNOSIS — F411 Generalized anxiety disorder: Secondary | ICD-10-CM

## 2010-05-14 DIAGNOSIS — R141 Gas pain: Secondary | ICD-10-CM

## 2010-05-14 DIAGNOSIS — G4700 Insomnia, unspecified: Secondary | ICD-10-CM

## 2010-05-14 MED ORDER — NORTRIPTYLINE HCL 10 MG PO CAPS: 10.0000 mg | ORAL_CAPSULE | Freq: Every day | ORAL | Status: DC

## 2010-05-14 NOTE — Assessment & Plan Note (Signed)
Ambien prn Start Nortyptiline

## 2010-05-14 NOTE — Assessment & Plan Note (Signed)
On Rx prn 

## 2010-05-14 NOTE — Assessment & Plan Note (Signed)
Better  

## 2010-05-14 NOTE — Progress Notes (Signed)
  Subjective:    Patient ID: Lindsey Dickson, female    DOB: 1920/04/24, 77 y.o.   MRN: [id number]  HPI   The patient presents for a follow-up of  Chronic insomnia, GERD, allergy  Review of Systems  Constitutional: Negative for chills and diaphoresis.  HENT: Negative for hearing loss.   Respiratory: Negative for cough and shortness of breath.   Cardiovascular: Negative for leg swelling.  Genitourinary: Negative for decreased urine volume and enuresis.  Musculoskeletal: Negative for back pain.  Neurological: Negative for light-headedness.  Psychiatric/Behavioral: Negative for suicidal ideas, confusion and dysphoric mood. The patient is nervous/anxious.        Objective:   Physical Exam  Constitutional: She appears well-developed and well-nourished. No distress.  HENT:  Head: Normocephalic.  Right Ear: External ear normal.  Left Ear: External ear normal.  Nose: Nose normal.  Mouth/Throat: Oropharynx is clear and moist.  Eyes: Conjunctivae are normal. Pupils are equal, round, and reactive to light. Right eye exhibits no discharge. Left eye exhibits no discharge.  Neck: Normal range of motion. Neck supple. No JVD present. No tracheal deviation present. No thyromegaly present.  Cardiovascular: Normal rate, regular rhythm and normal heart sounds.   Pulmonary/Chest: No stridor. No respiratory distress. She has no wheezes.  Abdominal: Soft. Bowel sounds are normal. She exhibits no distension and no mass. There is no tenderness. There is no rebound and no guarding.  Musculoskeletal: She exhibits no edema and no tenderness.  Lymphadenopathy:    She has no cervical adenopathy.  Neurological: She displays normal reflexes. No cranial nerve deficit. She exhibits normal muscle tone. Coordination normal.  Skin: No rash noted. No erythema.  Psychiatric: She has a normal mood and affect. Her behavior is normal. Judgment and thought content normal.       Lab Results  Component Value Date   WBC  4.9 02/17/2010   HGB 14.1 02/17/2010   HCT 43.1 02/17/2010   PLT 266 02/17/2010   CHOL 279* 12/20/2009   TRIG 169.0* 12/20/2009   HDL 65.20 12/20/2009   LDLDIRECT 177.6 12/20/2009   ALT 12 12/20/2009   AST 19 12/20/2009   NA 141 02/17/2010   K 3.0* 02/17/2010   CL 99 02/17/2010   CREATININE 0.94 02/17/2010   BUN 7 02/17/2010   CO2 31 02/17/2010   TSH 0.61 12/20/2009     Assessment & Plan:  ABDOMINAL BLOATING Better  ANXIETY On Rx prn  Insomnia, Unspecified Ambien prn Start Nortyptiline

## 2010-05-14 NOTE — Assessment & Plan Note (Signed)
Declined Rx 

## 2010-06-04 NOTE — Assessment & Plan Note (Signed)
[facility]                         GASTROENTEROLOGY OFFICE NOTE   HENESSY, ROHRER                        MRN:          [id number]  DATE:01/05/2007                            DOB:          09-Jan-1921    CHIEF COMPLAINT:  Followup irritable bowel and constipation.   She took Xifaxan and thinks she was helped by that somewhat. She still  has a little bit of constipation, gas and bloating. Two MiraLax a day  causes her to go too often. Things are really the same. Her weight is  stable as well.   MEDICATIONS:  Listed and reviewed in the chart as are allergies.   PAST MEDICAL HISTORY:  Reviewed and unchanged from previous note.   Weight is 95 pounds, pulse 76, blood pressure 120/60, height 5 feet 1  inches.   ASSESSMENT:  1. Irritable bowel syndrome stable to improved after Xifaxan therapy.  2. Diverticulosis.  3. Personal history of colon polyps.  4. Weight loss which has stabilized.  5. Gastroesophageal reflux disease which is not a big issue at this      time.   PLAN:  Continue current therapy. I offered a colonoscopy because of her  history of polyps 5 years ago. I do not feel strongly that she should  have one at her age etc., but it is reasonable. She has elected to defer  that at this time understanding that there is a risk of colon cancer  since her last colonoscopy. She will see me as needed. Followup with Dr.  Posey Rea for regular medical care.     Iva Boop, MD,FACG  Electronically Signed    CEG/MedQ  DD: 01/05/2007  DT: 01/06/2007  Job #: [id number]

## 2010-06-04 NOTE — Assessment & Plan Note (Signed)
[facility]                         GASTROENTEROLOGY OFFICE NOTE   VONETTA, FOULK                        MRN:          [id number]  DATE:09/22/2006                            DOB:          [birth year]    PROBLEM LIST:  1. Gastroesophageal reflux disease.  2. Irritable bowel syndrome.  Constipation predominant.  3. Diverticulosis.  4. History of colon polyps, adenomatous polyps removed in 2003.  5. Hypertension.  6. Insomnia.  7. Chronic intermittent dysuria.  8. History of nephrolithiasis.  9. Anxiety and depression.  10.Bunions.  11.Dyslipidemia in the past.  12.Cataract surgery.  13.Chronic rhinitis.  14.Thyroid nodule followed by Hermelinda Medicus, M.D. in the past.   This patient used to see Dr. Corinda Gubler.  When she saw him last she was  told that she could have a colonoscopy if she wanted to.  She had  several colon polyps, the largest 8 mm adenomatous removed in March of  2003.  She is still having constipation and complaining of bloating and  gassiness.  It is not clear to me if she tried the [place] or Flora-Q that  he prescribed in the spring.  She has been taking some Lactulose which  is causing her to bloat more.  She never really took MiraLax on a daily  basis and thought it worked too slowly.  No bleeding.  She is also  complaining of heartburn and indigestion.  She was using some samples of  Zegerid and now has purchased the Zegerid and is just starting to take  that.  She takes it in the powder form because she has some problems  swallowing the tablets due to her thyroid issue.   ALLERGIES:  TRIMOX, PENICILLIN, SULFA, CIPRO, FLAGYL.   MEDICATIONS:  1. Claritin 10 mg daily.  2. Ambien half tablet nightly.  3. Centrum Silver daily.  4. Lactulose daily.  5. Zegerid 40 mg daily.   PHYSICAL EXAMINATION:  GENERAL:  A well-developed, black woman in no  acute distress.  VITAL SIGNS:  Weight 92 pounds, she was 94 pounds in the spring,  she  thinks she is losing some weight.  Five years ago, she was 99 pounds.  Pulse 74, blood pressure 130/82.  HEENT:  Eyes anicteric.  ABDOMEN:  Soft, nontender.  No organomegaly or mass.  She does have some  mild irritation to deep palpation in the suprapubic area.   REVIEW OF SYSTEMS:  Not mentioned above.  She is having itching and  burning with urination.   ASSESSMENT:  1. Gastroesophageal reflux disease.  2. Irritable bowel syndrome, constipation predominant.  3. Dysuria.  4. History of colon polyps.  5. Question of weight loss, does not seem that dramatic to me.   PLAN:  1. Check urinalysis.  2. Stop Lactulose.  3. MiraLax daily.  She will take a MiraLax purge with four doses in 1-      2 hours when she is ready and can do that.  4. See me in 6 weeks at which point we will revisit colonoscopy.  She      is not ready  to have one yet.  It is reasonable, though,      complications would be worse for her at her age.  5. Zegerid every morning, a card is given to try to help defray the      costs.  6. Consider adding a probiotic depending on these results.  Amitiza is      an issue too,though and  the cost, I think, might be an issue      there.  7. If she continues to lose weight, we may need to evaluate further.  8. TSH was normal last year and may need some labs again regarding      that.     Iva Boop, MD,FACG  Electronically Signed    CEG/MedQ  DD: 09/22/2006  DT: 09/22/2006  Job #: [id number]

## 2010-06-04 NOTE — Assessment & Plan Note (Signed)
[facility]                         GASTROENTEROLOGY OFFICE NOTE   [name], [name]                        MRN:          [id number]  DATE:06/19/2006                            DOB:          03-Jun-1920    Lindsey Dickson comes in just to say goodbye, essentially.  Sweet lady, 77 years of  age, looks much younger and I told her so.  Says she has some acid  reflux, gas, and bloating at times.  Nothing has really changed.  She  has had this for years.   PAST MEDICAL HISTORY:  1. She is status post colon polyps with a history of IBS in the past.  2. She has a history of GERD.  3. History of hyperlipidemia.  4. Anxiety/depression.   PHYSICAL EXAMINATION:  Her weight was pretty stable at 94.  Blood  pressure 128/70, pulse 60 and regular.  She looks younger than stated age.  Oropharynx was negative.  Neck was negative.  Supraclavicular area was negative.  Chest was clear to auscultation.  Heart revealed a regular rhythm without a significant murmur.  Abdomen was soft, flat, no masses, or organomegaly.  Extremities were unremarkable.  Rectal was deferred.   IMPRESSION:  As above plus arteriosclerotic cardiovascular disease.  History of renal lithiasis.   RECOMMENDATIONS:  Try Align or Flora-Q which we gave her samples of,  some Zegerid, and to continue on the MiraLax.  I told her to follow up  with one of our physicians in the future.  I would say 6 months or  before because of her age.     Ulyess Mort, MD  Electronically Signed    SML/MedQ  DD: 06/19/2006  DT: 06/19/2006  Job #: [id number]

## 2010-06-04 NOTE — Assessment & Plan Note (Signed)
[facility]                         GASTROENTEROLOGY OFFICE NOTE   Lindsey Dickson, Lindsey Dickson                        MRN:          [id number]  DATE:11/03/2006                            DOB:          February 19, 1920    CHIEF COMPLAINT:  Follow up constipation, irritable bowel.   HISTORY:  See the problem list from last visit on September 22, 2006.  She is moving her bowels better on MiraLax daily but says she still has  incomplete defecation.  Her weight is stable.  She gets crampy abdominal  pain and a lot of bloating and gas.  She says this is really the way she  has been ever since she had her child years ago.  She is concerned about  taking the Zegerid.  She is taking it every other day, because she read  that it could cause constipation.  No bleeding reported.  She is still  anxious about a colonoscopy.  She has a history of adenomatous colon  polyps.  She last had a colonoscopy about five years ago.   She did not have a urinary tract infection.  The urinalysis was  negative.   PHYSICAL EXAMINATION:  VITAL SIGNS:  Weight 94 pounds, which is stable,  pulse 78, blood pressure 142/70.  GENERAL:  She is alert and oriented x3.   ASSESSMENT:  1. I think she has constipation, predominantly irritable bowel      syndrome.  2. History of diverticulosis.  3. Personal history of colon polyps.  4. Some weight loss, mild.  She is stable.   PLAN:  1. She is improved on the MiraLax.  We will go to twice daily.  2. Will try Xifaxan 400 mg three times daily for 10 days, to see if      that helps her constipation, predominantly irritable bowel and gas      (question small bowel bacterial overgrowth).  Samples are given to      supply that number.  3. I want to see her again in two months.  4. She was offered a colonoscopy.  I think it is reasonable, but she      is concerned about the prep and the issues and at her advanced age,      she is certainly at higher risk of  problems if a complication      occurs.  So she will think about that.     Iva Boop, MD,FACG  Electronically Signed    CEG/MedQ  DD: 11/03/2006  DT: 11/03/2006  Job #: [id number]

## 2010-06-07 NOTE — H&P (Signed)
[place]  Patient:    Lindsey Dickson, Lindsey Dickson Visit Number: [id number] MRN: [id number]          Service Type: DSU Location: [facility] 25 Attending Physician:  Ivor Messier Dictated by:   Guadelupe Sabin, M.D. Admit Date:  05/04/2001   CC:         Sonda Primes, M.D. [facility]   History and Physical  REASON FOR ADMISSION:  This was a planned outpatient surgical admission of this 77 year old black female admitted for cataract/implant surgery of the right eye.  PRESENT ILLNESS:  This patient had previous cataract/implant surgery performed by Dr. Richarda Overlie, of [place], [place] on May 03, 1998. During the postoperative period, the patients vision was decreased slightly and it was felt the patient had possible cystoid macular edema.  This cleared and vision was approximately 20/25.  I have been following this patient since a referral from Dr. Dagoberto Ligas at that time.  The patient has gradually developed cataract formation in the right eye, cortical and nuclear, and the patient has elected to proceed with cataract/implant surgery.  She signed an informed consent and arrangements were made for her outpatient admission.  The patient had noted good vision prior to cataract formation with blurring, smoky vision, dim vision with difficulty driving and seeing road signs, difficulty with television, reading and trouble seeing at night and in bright sunlight. Colors appeared dim and dull and there were slight problems with depth perception.  For these reasons, the patient wished to proceed.  PAST MEDICAL HISTORY:  The patient is in stable general health under the care of Dr. Trinna Post Plotnikov.  REVIEW OF SYSTEMS:  No cardiorespiratory complaints.  PHYSICAL EXAMINATION:  VITAL SIGNS:  As recorded on admission, blood pressure 117/80, temperature 97.3, pulse 82, respirations 18.  GENERAL APPEARANCE:  The patient is a pleasant,  well-nourished, well-developed black female in no acute distress.  HEENT:  Visual acuity with best correction: 20/50 -- right eye, 20/30 to 20/40 -- left eye.  Applanation tonometry 18 mm, each eye.  Slitlamp examination: Cortical and nuclear cataract formation, right eye; posterior chamber implant, left eye.  Detailed fundus examination revealed a clear vitreous, attached retina with normal optic nerve, blood vessels and macula.  CHEST:  Lungs clear to percussion and auscultation.  HEART:  Normal sinus rhythm.  No cardiomegaly.  No murmurs.  ABDOMEN:  Negative.  EXTREMITIES:  Negative.  ADMISSION DIAGNOSES: 1. Senile cataract, right eye. 2. Pseudophakia, left eye.  SURGICAL PLAN:  Cataract/implant surgery, right eye. Dictated by:   Guadelupe Sabin, M.D. Attending Physician:  Ivor Messier DD:  05/04/01 TD:  05/04/01 Job: [id number]

## 2010-06-07 NOTE — Op Note (Signed)
[place]. [facility]  Patient:    Lindsey Dickson, Lindsey Dickson Visit Number: [id number] MRN: [id number]          Service Type: DSU Location: [facility] 28[address] 25 Attending Physician:  Ivor Messier Dictated by:   Guadelupe Sabin, M.D. Proc. Date: 05/04/01 Admit Date:  05/04/2001   CC:         Sonda Primes, M.D. [facility]   Operative Report  PREOPERATIVE DIAGNOSIS:  Senile cataract, right eye.  POSTOPERATIVE DIAGNOSIS:  Senile cataract, right eye.  OPERATION:  Planned extracapsular cataract extraction -- phacoemulsification, primary insertion of posterior chamber intraocular lens implant.  SURGEON:  Guadelupe Sabin, M.D.  ASSISTANT:  Nurse.  ANESTHESIA:  This patient had local plus general.  DESCRIPTION OF PROCEDURE:  After the patient was prepped and draped, with suitable anesthesia, a lid speculum was inserted in the right eye.  The eye was turned downward and Schiotz tonometry was recorded at 5 scale units with a Schiotz tonometer.  A peritomy was performed adjacent to the limbus from the 11 to 1 oclock position.  The corneoscleral junction was cleaned and a corneoscleral groove made with a 45 degree Superblade.  The anterior chamber was entered with the 2.5-mm diamond keratome at the 12 oclock position and a 15 degree blade at the 2:30 position.  Using a bent 26-gauge needle on a Healon syringe, a circular capsulorrhexis was begun and then continued with the Grabow forceps.  Hydrodissection and hydrodelineation were performed using 1% Xylocaine.  A 30 degree phacoemulsification tip was then inserted with slow controlled emulsification of the lens nucleus, total ultrasonic time -- 48 seconds, average power level -- 16%, total amount of fluid used -- 50 cc. Following removal of the nucleus, the residual cortex was aspirated with the irrigation-aspiration tip.  The posterior capsule appeared intact and it was elected to insert an Allergan [id number] silicone  three-piece posterior chamber intraocular lens implant with UV absorber, diopter strength +19.00.  This was inserted with the McDonald forceps into the anterior chamber and then centered into the capsular bag using the [facility] lens rotator; the lens appears to be well-centered.  The Healon which had been used throughout the procedure was aspirated and replaced with balanced salt solution and Miochol ophthalmic solution.  The operative incisions appeared to be self-sealing and no sutures were required.  A drop of Pilopine ophthalmic ointment was instilled in the conjunctival cul-de-sac and Maxitrol ointment.  A light patch and protective shield were applied.  No sutures were needed.  Duration of procedure -- 45 minutes.  Patient tolerated the procedure well in general and left the operating room for the recovery room in good condition. Dictated by:   Guadelupe Sabin, M.D. Attending Physician:  Ivor Messier DD:  05/04/01 TD:  05/04/01 Job: [id number]

## 2010-06-10 ENCOUNTER — Telehealth: Payer: Self-pay | Admitting: *Deleted

## 2010-06-10 NOTE — Telephone Encounter (Signed)
Nortriptyline 10 mg is the lowest dose (it is used as 100 mg dose for depression). OK to use Ambien instead if issues. Thx

## 2010-06-10 NOTE — Telephone Encounter (Signed)
Pharm called - Pt is very worried that nortriptyline dose is going to be too strong. She can not take 1/2 dose b/c med only comes in capsules. Pharm tried to explain to pt that the 10 mg was a very low dose. She would like to resume the Ambien. Did you want pt to take nortriptyline every day and use Ambien only as needed for sleep?

## 2010-06-11 NOTE — Telephone Encounter (Signed)
Spoke w/pharmacist. He discussed issue with pt again today. Pt was given some samples of silenor by MD which she will try BUT insurance will not likely pay for med. Gave verbal ok to fill ambien rf pt had on file if she does not want to take silenor or nortriptyline.

## 2010-07-03 ENCOUNTER — Telehealth: Payer: Self-pay | Admitting: *Deleted

## 2010-07-03 NOTE — Telephone Encounter (Signed)
rec rf req for Ambien 10mg  1 po qhs 1 po qhs. # 30. Last filled 06-12-10. Ok to Rf?

## 2010-07-05 ENCOUNTER — Other Ambulatory Visit: Payer: Self-pay | Admitting: *Deleted

## 2010-07-05 MED ORDER — ZOLPIDEM TARTRATE 10 MG PO TABS: 5.0000 mg | ORAL_TABLET | Freq: Every evening | ORAL | Status: DC | PRN

## 2010-07-05 NOTE — Telephone Encounter (Signed)
Faxed paper request back to [facility], also notified pt with info.Marland KitchenMarland Kitchen6/15/12@1 :45pm/LMB

## 2010-07-05 NOTE — Telephone Encounter (Signed)
OK to fill this prescription with additional refills x3 Thank you!  

## 2010-07-05 NOTE — Telephone Encounter (Signed)
Duplicate msg md has already ok refills. Faxed paper request back to [employer].Marland KitchenMarland Kitchen6/15/12@1 :47pm/LMB

## 2010-07-05 NOTE — Telephone Encounter (Signed)
Pt left msg on vm stating pharmacy has fax refill on her sleeping med Remus Loffler). Has not heard back from md office. Is this ok to refill?

## 2010-07-08 ENCOUNTER — Telehealth: Payer: Self-pay | Admitting: *Deleted

## 2010-07-08 ENCOUNTER — Ambulatory Visit: Payer: Medicare Other | Admitting: Internal Medicine

## 2010-07-08 NOTE — Telephone Encounter (Signed)
Try a 1/4 or 1/2 of Zolpidem. In generic dose may be a little higher Thx

## 2010-07-08 NOTE — Telephone Encounter (Signed)
Pt states that she has been dizzy & nauseated since starting the Zolpidem on 07/05/2010--she states that she never had this problem with brand name/Ambien but that she cannot afford. Pt has 08/13/10 OV scheduled but would like to know if you have any suggestions before visit.?

## 2010-07-09 NOTE — Telephone Encounter (Signed)
Pt does not want to take the Zolpidem, she did not take last night, because of the side effects [dizzy, nausea] that she has been experiencing. Pt requesting Rx for DAW Ambien 10mg  -- she is willing to pay the price [$121 per Pt]; she did not sleep last night. Pt also brought up Lorazepam 1mg  that she had previously taken that helped her with sleep as well; would like your input on this as to whether you feel that this medication could be as effective and more costwise.?

## 2010-07-09 NOTE — Telephone Encounter (Signed)
LMOM for Pt to call back and let me know if she would like to try the samples of Intermezzo OR if she would like me to call in Brand Name Only Rx for Ambien to her pharmacy.

## 2010-07-09 NOTE — Telephone Encounter (Signed)
Ok to sub to brand Ambien She can also try Intermezzo sample - pick up Thx

## 2010-07-10 ENCOUNTER — Encounter: Payer: Self-pay | Admitting: Internal Medicine

## 2010-07-11 ENCOUNTER — Other Ambulatory Visit (INDEPENDENT_AMBULATORY_CARE_PROVIDER_SITE_OTHER): Payer: Medicare Other

## 2010-07-11 ENCOUNTER — Ambulatory Visit (INDEPENDENT_AMBULATORY_CARE_PROVIDER_SITE_OTHER): Payer: Medicare Other | Admitting: Internal Medicine

## 2010-07-11 VITALS — BP 140/90 | HR 88 | Temp 99.4°F | Resp 16 | Wt 93.0 lb

## 2010-07-11 DIAGNOSIS — E876 Hypokalemia: Secondary | ICD-10-CM

## 2010-07-11 DIAGNOSIS — G4700 Insomnia, unspecified: Secondary | ICD-10-CM

## 2010-07-11 DIAGNOSIS — R1013 Epigastric pain: Secondary | ICD-10-CM

## 2010-07-11 DIAGNOSIS — I10 Essential (primary) hypertension: Secondary | ICD-10-CM

## 2010-07-11 LAB — URINALYSIS
Bilirubin Urine: NEGATIVE
Hgb urine dipstick: NEGATIVE
Leukocytes, UA: NEGATIVE
Nitrite: NEGATIVE

## 2010-07-11 MED ORDER — ZOLPIDEM TARTRATE 3.5 MG SL SUBL: 1.0000 | SUBLINGUAL_TABLET | SUBLINGUAL | Status: DC

## 2010-07-11 MED ORDER — POTASSIUM CHLORIDE 10 MEQ PO TBCR: 10.0000 meq | EXTENDED_RELEASE_TABLET | Freq: Every day | ORAL | Status: DC

## 2010-07-11 NOTE — Assessment & Plan Note (Signed)
BP Readings from Last 3 Encounters:  07/11/10 140/90  05/14/10 120/70  04/10/10 110/68

## 2010-07-11 NOTE — Assessment & Plan Note (Signed)
See Meds 

## 2010-07-11 NOTE — Telephone Encounter (Signed)
Pt in for OV 07/11/10

## 2010-07-11 NOTE — Progress Notes (Signed)
  Subjective:    Patient ID: Lindsey Dickson, female    DOB: [birth year], 77 y.o.   MRN: [id number]  HPI  C/o a couple days of upset stomach F/u insomnia - problems w/sleeping pills F/u on hypokalemia and HTN  Review of Systems  Constitutional: Negative for chills, activity change, appetite change, fatigue and unexpected weight change.  HENT: Negative for congestion, mouth sores and sinus pressure.   Eyes: Negative for visual disturbance.  Respiratory: Negative for cough and chest tightness.   Gastrointestinal: Negative for nausea and abdominal pain.  Genitourinary: Negative for frequency, difficulty urinating and vaginal pain.  Musculoskeletal: Negative for back pain and gait problem.  Skin: Negative for pallor and rash.  Neurological: Negative for dizziness, tremors, weakness, numbness and headaches.  Psychiatric/Behavioral: Positive for sleep disturbance. Negative for confusion. The patient is nervous/anxious.        Objective:   Physical Exam  Constitutional: She appears well-developed and well-nourished. No distress.  HENT:  Head: Normocephalic.  Right Ear: External ear normal.  Left Ear: External ear normal.  Nose: Nose normal.  Mouth/Throat: Oropharynx is clear and moist.  Eyes: Conjunctivae are normal. Pupils are equal, round, and reactive to light. Right eye exhibits no discharge. Left eye exhibits no discharge.  Neck: Normal range of motion. Neck supple. No JVD present. No tracheal deviation present. No thyromegaly present.  Cardiovascular: Normal rate, regular rhythm and normal heart sounds.   Pulmonary/Chest: No stridor. No respiratory distress. She has no wheezes.  Abdominal: Soft. Bowel sounds are normal. She exhibits no distension and no mass. There is no tenderness. There is no rebound and no guarding.  Musculoskeletal: She exhibits no edema and no tenderness.  Lymphadenopathy:    She has no cervical adenopathy.  Neurological: She displays normal reflexes. No  cranial nerve deficit. She exhibits normal muscle tone. Coordination normal.  Skin: No rash noted. No erythema.  Psychiatric: She has a normal mood and affect. Her behavior is normal. Judgment and thought content normal.          Assessment & Plan:

## 2010-07-11 NOTE — Assessment & Plan Note (Signed)
Controlled w/KCl She can take spironolactone prn Check labs

## 2010-07-11 NOTE — Assessment & Plan Note (Signed)
Likely she had a virl illness a few days ago- sx's resolved

## 2010-07-23 ENCOUNTER — Telehealth: Payer: Self-pay | Admitting: *Deleted

## 2010-07-23 MED ORDER — POTASSIUM CHLORIDE ER 8 MEQ PO TBCR: 8.0000 meq | EXTENDED_RELEASE_TABLET | Freq: Every day | ORAL | Status: DC

## 2010-07-23 NOTE — Telephone Encounter (Signed)
klor-con 8 -- done Thx

## 2010-07-23 NOTE — Telephone Encounter (Signed)
Spoke with patient and advised to check pharmacy

## 2010-07-23 NOTE — Telephone Encounter (Signed)
Patient requesting alt rx for potassium - current rx is too large to swallow.

## 2010-08-13 ENCOUNTER — Ambulatory Visit: Payer: Medicare Other | Admitting: Internal Medicine

## 2010-08-19 ENCOUNTER — Encounter: Payer: Self-pay | Admitting: Internal Medicine

## 2010-08-19 ENCOUNTER — Telehealth: Payer: Self-pay | Admitting: *Deleted

## 2010-08-19 ENCOUNTER — Ambulatory Visit (INDEPENDENT_AMBULATORY_CARE_PROVIDER_SITE_OTHER): Payer: Medicare Other | Admitting: Internal Medicine

## 2010-08-19 ENCOUNTER — Other Ambulatory Visit (INDEPENDENT_AMBULATORY_CARE_PROVIDER_SITE_OTHER): Payer: Medicare Other

## 2010-08-19 VITALS — BP 130/92 | HR 84 | Temp 97.4°F | Ht 61.0 in | Wt 93.0 lb

## 2010-08-19 DIAGNOSIS — E785 Hyperlipidemia, unspecified: Secondary | ICD-10-CM

## 2010-08-19 DIAGNOSIS — F411 Generalized anxiety disorder: Secondary | ICD-10-CM

## 2010-08-19 DIAGNOSIS — R300 Dysuria: Secondary | ICD-10-CM

## 2010-08-19 DIAGNOSIS — R110 Nausea: Secondary | ICD-10-CM

## 2010-08-19 DIAGNOSIS — E876 Hypokalemia: Secondary | ICD-10-CM

## 2010-08-19 LAB — BASIC METABOLIC PANEL
BUN: 11 mg/dL (ref 6–23)
Chloride: 98 mEq/L (ref 96–112)
Creatinine, Ser: 0.9 mg/dL (ref 0.4–1.2)

## 2010-08-19 LAB — TSH: TSH: 0.62 u[IU]/mL (ref 0.35–5.50)

## 2010-08-19 LAB — URINALYSIS
Bilirubin Urine: NEGATIVE
Ketones, ur: NEGATIVE
Leukocytes, UA: NEGATIVE
Urobilinogen, UA: 0.2 (ref 0.0–1.0)
pH: 8 (ref 5.0–8.0)

## 2010-08-19 MED ORDER — PROMETHAZINE HCL 12.5 MG PO TABS: 12.5000 mg | ORAL_TABLET | Freq: Four times a day (QID) | ORAL | Status: AC | PRN

## 2010-08-19 MED ORDER — FAMOTIDINE 20 MG PO TABS: 20.0000 mg | ORAL_TABLET | Freq: Two times a day (BID) | ORAL | Status: DC

## 2010-08-19 NOTE — Telephone Encounter (Signed)
Pt left vm - is it ok to take [place] tonight due to low potassium?

## 2010-08-19 NOTE — Patient Instructions (Signed)
It was good to see you today. Urine test(s) ordered today. Your results will be called to you after review (48-72hours after test completion). If any changes need to be made, you will be notified at that time. Use promethazine at night for nausea and for sleep instead of the ambein - also use generic Pepcid for stomach acid in place of prior nexium Your prescription(s) have been submitted to your pharmacy. Please take as directed and contact our office if you believe you are having problem(s) with the medication(s).

## 2010-08-19 NOTE — Progress Notes (Signed)
  Subjective:    Patient ID: Lindsey Dickson, female    DOB: [birth year], 77 y.o.   MRN: [id number]  HPI complains of nausea, no vomitting Onset 3 days ago Denies association with bowel change - chronic constipation - no abdominal pain No fever or chills, no dysuria  symptoms better with food -  symptoms not positional or exertional Denies med or diet changes - but eating "more blueberry jam in last 3 days", no NSAIDs or reflux  Past Medical History  Diagnosis Date  . IBS (irritable bowel syndrome)   . Constipation, chronic   . Allergic rhinitis   . CHF (congestive heart failure)   . Depression   . Diverticulitis   . GERD (gastroesophageal reflux disease)   . Hyperlipidemia   . LBP (low back pain)   . Colon polyps      Review of Systems  Constitutional: Negative for unexpected weight change.  Respiratory: Negative for cough and shortness of breath.   Cardiovascular: Negative for chest pain.  Genitourinary: Negative for hematuria, flank pain and vaginal bleeding.       Objective:   Physical Exam BP 130/92  Pulse 84  Temp(Src) 97.4 F (36.3 C) (Oral)  Ht 5\' 1"  (1.549 m)  Wt 93 lb (42.185 kg)  BMI 17.57 kg/m2  SpO2 97% Wt Readings from Last 3 Encounters:  08/19/10 93 lb (42.185 kg)  07/11/10 93 lb (42.185 kg)  05/14/10 93 lb (42.185 kg)    Constitutional: She is thin, oriented to person, place, and time. She appears well-developed and well-nourished. No distress.  Cardiovascular: Normal rate, regular rhythm and normal heart sounds.  No murmur heard. No BLE edema. Pulmonary/Chest: Effort normal and breath sounds normal. No respiratory distress. She has no wheezes.  Abdominal: Soft. Bowel sounds are normal. She exhibits no distension. There is no tenderness.  Psychiatric: She has a normal mood and affect. Her behavior is normal. Judgment and thought content normal.   Lab Results  Component Value Date   WBC 4.9 02/17/2010   HGB 14.1 02/17/2010   HCT 43.1 02/17/2010   PLT 266 02/17/2010   CHOL 279* 12/20/2009   TRIG 169.0* 12/20/2009   HDL 65.20 12/20/2009   LDLDIRECT 177.6 12/20/2009   ALT 12 12/20/2009   AST 19 12/20/2009   NA 141 02/17/2010   K 3.0* 02/17/2010   CL 99 02/17/2010   CREATININE 0.94 02/17/2010   BUN 7 02/17/2010   CO2 31 02/17/2010   TSH 0.61 12/20/2009       Assessment & Plan:   Nausea - hx and exam benign - noted hx IBS, also GERD but not taking nexium due to cost -  check UA and rule out UTI (recent hx same) - pt declines other labs at this time Use promethazine for nausea symptoms in place of ambein for sleep - also resume antiacid H2B in place of nexium - erx done follow up PCP if not improved, sooner if worse

## 2010-08-19 NOTE — Telephone Encounter (Signed)
Lab called w/Critical - Potassium 2.8 - Dr Debby Bud informed in PCP's absence. Dr Debby Bud advised - take 5 of the 8 meq potassium pills now, at 6 pm and then at bedtime. Pt informed - also will come in tomorrow for recheck. Order entered.

## 2010-08-19 NOTE — Telephone Encounter (Signed)
Ok o take [place]

## 2010-08-19 NOTE — Telephone Encounter (Signed)
Patient informed. 

## 2010-08-20 ENCOUNTER — Other Ambulatory Visit (INDEPENDENT_AMBULATORY_CARE_PROVIDER_SITE_OTHER): Payer: Medicare Other

## 2010-08-20 DIAGNOSIS — E876 Hypokalemia: Secondary | ICD-10-CM

## 2010-08-21 ENCOUNTER — Telehealth: Payer: Self-pay | Admitting: *Deleted

## 2010-08-21 NOTE — Telephone Encounter (Signed)
Patient requesting results of potassium and would like to know what dose to take now.

## 2010-08-21 NOTE — Telephone Encounter (Signed)
K is 4.1. Resume her routine K 10 meq daily, repeat lab next Tuesday.

## 2010-08-22 NOTE — Telephone Encounter (Signed)
Patient informed. 

## 2010-10-15 ENCOUNTER — Ambulatory Visit: Payer: Medicare Other | Admitting: Internal Medicine

## 2010-11-06 ENCOUNTER — Other Ambulatory Visit: Payer: Self-pay | Admitting: Internal Medicine

## 2010-11-06 NOTE — Telephone Encounter (Signed)
Done hardcopy to dahlia/LIM B  

## 2010-11-06 NOTE — Telephone Encounter (Signed)
Please advise in PCPs absence.  

## 2010-11-27 ENCOUNTER — Telehealth: Payer: Self-pay | Admitting: *Deleted

## 2010-11-27 NOTE — Telephone Encounter (Signed)
Liquid tastes very bad..Is it necessary? Thx

## 2010-11-27 NOTE — Telephone Encounter (Signed)
Rec fax from pharmacy stating pt would like to switch her Potassium chloride 8 meq tablets to liquid. Please advise. Ok to switch? What strength/quantity/sig?

## 2010-11-28 MED ORDER — POTASSIUM CHLORIDE 20 MEQ PO PACK: 20.0000 meq | PACK | Freq: Every day | ORAL | Status: DC

## 2010-11-28 NOTE — Telephone Encounter (Signed)
Left detailed mess informing pt.  

## 2010-11-28 NOTE — Telephone Encounter (Signed)
Pt states she couldn't digest the tabs. She states she took one last wk and it came back up. She thinks that the taste will not bother her too bad.

## 2010-11-28 NOTE — Telephone Encounter (Signed)
Ok done Thx 

## 2010-12-10 ENCOUNTER — Encounter: Payer: Self-pay | Admitting: Internal Medicine

## 2010-12-10 ENCOUNTER — Ambulatory Visit (INDEPENDENT_AMBULATORY_CARE_PROVIDER_SITE_OTHER): Payer: Medicare Other | Admitting: Internal Medicine

## 2010-12-10 VITALS — BP 130/84 | HR 70 | Temp 97.8°F | Wt 96.0 lb

## 2010-12-10 DIAGNOSIS — G4700 Insomnia, unspecified: Secondary | ICD-10-CM

## 2010-12-10 DIAGNOSIS — K219 Gastro-esophageal reflux disease without esophagitis: Secondary | ICD-10-CM

## 2010-12-10 DIAGNOSIS — E785 Hyperlipidemia, unspecified: Secondary | ICD-10-CM

## 2010-12-10 DIAGNOSIS — E876 Hypokalemia: Secondary | ICD-10-CM

## 2010-12-10 DIAGNOSIS — I10 Essential (primary) hypertension: Secondary | ICD-10-CM

## 2010-12-10 MED ORDER — POTASSIUM CHLORIDE ER 8 MEQ PO TBCR: 8.0000 meq | EXTENDED_RELEASE_TABLET | Freq: Every day | ORAL | Status: DC

## 2010-12-10 NOTE — Assessment & Plan Note (Signed)
Change meds - needs a smaller pill

## 2010-12-10 NOTE — Assessment & Plan Note (Signed)
Continue with current prescription therapy as reflected on the Med list.  

## 2010-12-10 NOTE — Progress Notes (Signed)
  Subjective:    Patient ID: Lindsey Dickson, female    DOB: June 24, 1920, 77 y.o.   MRN: [id number]  HPI  The patient presents for a follow-up of  chronic hypertension, chronic dyslipidemia, insomnia controlled with medicines   Review of Systems  Constitutional: Negative for chills, activity change, appetite change, fatigue and unexpected weight change.  HENT: Negative for congestion, mouth sores and sinus pressure.   Eyes: Negative for visual disturbance.  Respiratory: Negative for cough and chest tightness.   Gastrointestinal: Negative for nausea and abdominal pain.  Genitourinary: Negative for frequency, difficulty urinating and vaginal pain.  Musculoskeletal: Negative for back pain and gait problem.  Skin: Negative for pallor and rash.  Neurological: Negative for dizziness, tremors, weakness, numbness and headaches.  Psychiatric/Behavioral: Positive for sleep disturbance. Negative for confusion.       Objective:   Physical Exam  Constitutional: She appears well-developed and well-nourished. No distress.  HENT:  Head: Normocephalic.  Right Ear: External ear normal.  Left Ear: External ear normal.  Nose: Nose normal.  Mouth/Throat: Oropharynx is clear and moist.  Eyes: Conjunctivae are normal. Pupils are equal, round, and reactive to light. Right eye exhibits no discharge. Left eye exhibits no discharge.  Neck: Normal range of motion. Neck supple. No JVD present. No tracheal deviation present. No thyromegaly present.  Cardiovascular: Normal rate, regular rhythm and normal heart sounds.   Pulmonary/Chest: No stridor. No respiratory distress. She has no wheezes.  Abdominal: Soft. Bowel sounds are normal. She exhibits no distension and no mass. There is no tenderness. There is no rebound and no guarding.  Musculoskeletal: She exhibits no edema and no tenderness.  Lymphadenopathy:    She has no cervical adenopathy.  Neurological: She displays normal reflexes. No cranial nerve deficit.  She exhibits normal muscle tone. Coordination normal.  Skin: No rash noted. No erythema.  Psychiatric: She has a normal mood and affect. Her behavior is normal. Judgment and thought content normal.          Assessment & Plan:

## 2010-12-10 NOTE — Assessment & Plan Note (Signed)
Cont w/a low fat diet

## 2010-12-10 NOTE — Assessment & Plan Note (Signed)
  On diet  

## 2010-12-10 NOTE — Assessment & Plan Note (Signed)
On NAS diet  

## 2010-12-27 ENCOUNTER — Emergency Department (HOSPITAL_COMMUNITY)
Admission: EM | Admit: 2010-12-27 | Discharge: 2010-12-28 | Disposition: A | Discharge status: Home / Self Care | Payer: Medicare Other | Attending: Emergency Medicine | Admitting: Emergency Medicine

## 2010-12-27 ENCOUNTER — Encounter (HOSPITAL_COMMUNITY): Payer: Self-pay | Admitting: Physical Medicine and Rehabilitation

## 2010-12-27 DIAGNOSIS — I509 Heart failure, unspecified: Secondary | ICD-10-CM | POA: Insufficient documentation

## 2010-12-27 DIAGNOSIS — K219 Gastro-esophageal reflux disease without esophagitis: Secondary | ICD-10-CM | POA: Insufficient documentation

## 2010-12-27 DIAGNOSIS — I10 Essential (primary) hypertension: Secondary | ICD-10-CM | POA: Insufficient documentation

## 2010-12-27 DIAGNOSIS — F329 Major depressive disorder, single episode, unspecified: Secondary | ICD-10-CM | POA: Insufficient documentation

## 2010-12-27 DIAGNOSIS — Z79899 Other long term (current) drug therapy: Secondary | ICD-10-CM | POA: Insufficient documentation

## 2010-12-27 DIAGNOSIS — Z9889 Other specified postprocedural states: Secondary | ICD-10-CM | POA: Insufficient documentation

## 2010-12-27 DIAGNOSIS — E785 Hyperlipidemia, unspecified: Secondary | ICD-10-CM | POA: Insufficient documentation

## 2010-12-27 DIAGNOSIS — F3289 Other specified depressive episodes: Secondary | ICD-10-CM | POA: Insufficient documentation

## 2010-12-27 HISTORY — DX: Essential (primary) hypertension: I10

## 2010-12-27 NOTE — ED Notes (Signed)
Pt presents to department for evaluation of hypertension. Pt states she is prescribed BP medications, but she does not take them. Denies pain. Respirations unlabored. Pt conscious alert and oriented x4. No signs of distress at the time. Family at bedside.

## 2010-12-28 ENCOUNTER — Other Ambulatory Visit: Payer: Self-pay

## 2010-12-28 LAB — CBC
Platelets: 257 10*3/uL (ref 150–400)
RBC: 5.07 MIL/uL (ref 3.87–5.11)
RDW: 13.1 % (ref 11.5–15.5)
WBC: 5.4 10*3/uL (ref 4.0–10.5)

## 2010-12-28 LAB — BASIC METABOLIC PANEL
CO2: 30 mEq/L (ref 19–32)
Calcium: 9.5 mg/dL (ref 8.4–10.5)
Chloride: 100 mEq/L (ref 96–112)
Glucose, Bld: 112 mg/dL — ABNORMAL HIGH (ref 70–99)
Potassium: 3.3 mEq/L — ABNORMAL LOW (ref 3.5–5.1)
Sodium: 139 mEq/L (ref 135–145)

## 2010-12-28 LAB — DIFFERENTIAL
Basophils Absolute: 0 10*3/uL (ref 0.0–0.1)
Lymphocytes Relative: 25 % (ref 12–46)
Lymphs Abs: 1.3 10*3/uL (ref 0.7–4.0)
Neutro Abs: 3.4 10*3/uL (ref 1.7–7.7)
Neutrophils Relative %: 64 % (ref 43–77)

## 2010-12-28 LAB — CARDIAC PANEL(CRET KIN+CKTOT+MB+TROPI)
CK, MB: 2.4 ng/mL (ref 0.3–4.0)
Total CK: 61 U/L (ref 7–177)
Troponin I: <0.3 ng/mL (ref <0.30)

## 2010-12-28 LAB — URINALYSIS, ROUTINE W REFLEX MICROSCOPIC
Hgb urine dipstick: NEGATIVE
Leukocytes, UA: NEGATIVE
Protein, ur: NEGATIVE mg/dL
Specific Gravity, Urine: 1.007 (ref 1.005–1.030)
Urobilinogen, UA: 0.2 mg/dL (ref 0.0–1.0)

## 2010-12-28 MED ORDER — HYDRALAZINE HCL 10 MG PO TABS
10.0000 mg | ORAL_TABLET | ORAL | Status: AC
  Administered 2010-12-28: 10 mg via ORAL
  Filled 2010-12-28: qty 1

## 2010-12-28 NOTE — ED Provider Notes (Signed)
History     CSN: [id number] Arrival date & time: 12/27/2010  9:31 PM   First MD Initiated Contact with Patient 12/28/10 0012      Chief Complaint  Patient presents with  . Hypertension    (Consider location/radiation/quality/duration/timing/severity/associated sxs/prior treatment) HPI Comments: Patient presents from home with elevated blood pressure this evening. She took her blood pressure home is 180/80. Her grandson then took her to the pharmacy where they got readings in the 160s to 180s.  She says that she does have a blood pressure pill but she does not take it. She does not recall able to spell her not able to find it in the computer record system. Patient states she feels very well this time denies any headache, visual change, chest pain, shortness of breath, abdominal pain, nausea or vomiting. She can't explain why she doesn't take the blood pressure pill as she takes all of her other medications.  The history is provided by the patient.    Past Medical History  Diagnosis Date  . IBS (irritable bowel syndrome)   . Constipation, chronic   . Allergic rhinitis   . CHF (congestive heart failure)   . Depression   . Diverticulitis   . GERD (gastroesophageal reflux disease)   . Hyperlipidemia   . LBP (low back pain)   . Colon polyps   . Hypertension     Past Surgical History  Procedure Date  . Appendectomy   . Tubal ligation   . Cataract extraction     Family History  Problem Relation Age of Onset  . Hypertension Mother   . Lung cancer Brother   . Cancer Brother     lung    History  Substance Use Topics  . Smoking status: Never Smoker   . Smokeless tobacco: Not on file  . Alcohol Use: No    OB History    Grav Para Term Preterm Abortions TAB SAB Ect Mult Living                  Review of Systems  Constitutional: Negative for fever, activity change and appetite change.  HENT: Negative for congestion and rhinorrhea.   Eyes: Negative for visual  disturbance.  Respiratory: Negative for cough, chest tightness and shortness of breath.   Gastrointestinal: Negative for nausea, vomiting and abdominal pain.  Genitourinary: Negative for dysuria and hematuria.  Musculoskeletal: Negative for back pain.  Skin: Negative for rash.  Neurological: Negative for dizziness, weakness and headaches.    Allergies  Ciprofloxacin; Hydrochlorothiazide; Penicillins; and Sulfonamide derivatives  Home Medications   Current Outpatient Rx  Name Route Sig Dispense Refill  . AMBIEN 10 MG PO TABS  TAKE ONE TABLET AT BEDTIME 30 tablet 3  . CHOLECALCIFEROL 1000 UNITS PO TABS Oral Take 1,000 Units by mouth daily.      [name] FAMOTIDINE 20 MG PO TABS Oral Take 1 tablet (20 mg total) by mouth 2 (two) times daily. 60 tablet 1  . LORATADINE 10 MG PO TABS Oral Take 10 mg by mouth daily.      [name] MAGNESIUM HYDROXIDE 400 MG/5ML PO SUSP Oral Take 7.5 mLs by mouth daily as needed.     [name] POTASSIUM CHLORIDE CR 8 MEQ PO TBCR Oral Take 1 tablet (8 mEq total) by mouth daily. 30 tablet 11  . ALIGN 4 MG PO CAPS Oral Take by mouth daily.      . PSYLLIUM 0.52 G PO CAPS Oral Take 0.52 g by mouth 2 (two)  times daily.        BP 168/68  Pulse 83  Temp(Src) 97.9 F (36.6 C) (Oral)  Resp 16  SpO2 98%  Physical Exam  Constitutional: She is oriented to person, place, and time. She appears well-developed and well-nourished. No distress.  HENT:  Head: Normocephalic and atraumatic.  Mouth/Throat: Oropharynx is clear and moist. No oropharyngeal exudate.  Eyes: Conjunctivae are normal. Pupils are equal, round, and reactive to light.  Neck: Normal range of motion. Neck supple.  Cardiovascular: Normal rate, regular rhythm and normal heart sounds.   Pulmonary/Chest: Effort normal and breath sounds normal. No respiratory distress.  Abdominal: Soft. There is no tenderness. There is no rebound and no guarding.  Musculoskeletal: Normal range of motion. She exhibits no edema and no tenderness.    Neurological: She is alert and oriented to person, place, and time. No cranial nerve deficit.  Skin: Skin is warm.    ED Course  Procedures (including critical care time)  Labs Reviewed  URINALYSIS, ROUTINE W REFLEX MICROSCOPIC - Abnormal; Notable for the following:    APPearance CLOUDY (*)    All other components within normal limits  BASIC METABOLIC PANEL - Abnormal; Notable for the following:    Potassium 3.3 (*)    Glucose, Bld 112 (*)    GFR calc non Af Amer 74 (*)    GFR calc Af Amer 86 (*)    All other components within normal limits  CBC  DIFFERENTIAL  CARDIAC PANEL(CRET KIN+CKTOT+MB+TROPI)  LAB REPORT - SCANNED   No results found.   1. Hypertension       MDM  Hypertension with noncompliance. Patient is manic at this time. The pressures elevated over 200 on arrival. I am unable to find a blood pressure pill in her records..   Date: 12/28/2010  Rate: 82  Rhythm: normal sinus rhythm  QRS Axis: left  Intervals: normal  ST/T Wave abnormalities: normal and nonspecific ST/T changes  Conduction Disutrbances:none  Narrative Interpretation:   Old EKG Reviewed: unchanged  Given by mouth hydralazine in the ED and blood pressure improved to 166/77. Patient remains asymptomatic. Her kidney function and lites lites are normal. He struck to call her doctor first thing on Monday and resume her blood pressure medications. Instructed to return to the ED if he develops BP greater than 200 systolic or 110 diastolic.  I explained to the patient on several occasions importance of taking her blood pressure medicines and preventing attacks, stroke, kidney failure, eye damage.        Glynn Octave, MD 12/28/10 [id number]

## 2010-12-30 ENCOUNTER — Ambulatory Visit (INDEPENDENT_AMBULATORY_CARE_PROVIDER_SITE_OTHER): Payer: Medicare Other | Admitting: Internal Medicine

## 2010-12-30 ENCOUNTER — Encounter: Payer: Self-pay | Admitting: Internal Medicine

## 2010-12-30 VITALS — BP 150/84 | HR 72 | Temp 98.1°F | Resp 16 | Wt 94.0 lb

## 2010-12-30 DIAGNOSIS — Z733 Stress, not elsewhere classified: Secondary | ICD-10-CM

## 2010-12-30 DIAGNOSIS — F411 Generalized anxiety disorder: Secondary | ICD-10-CM

## 2010-12-30 DIAGNOSIS — F439 Reaction to severe stress, unspecified: Secondary | ICD-10-CM

## 2010-12-30 DIAGNOSIS — I10 Essential (primary) hypertension: Secondary | ICD-10-CM

## 2010-12-30 MED ORDER — AMLODIPINE BESYLATE 5 MG PO TABS: 5.0000 mg | ORAL_TABLET | Freq: Every day | ORAL | Status: DC

## 2010-12-30 NOTE — Assessment & Plan Note (Signed)
BP Readings from Last 3 Encounters:  12/30/10 150/84  12/28/10 168/68  12/10/10 130/84  Discussed. I suggested that she starts to take a BP medication

## 2010-12-30 NOTE — Progress Notes (Signed)
  Subjective:    Patient ID: Lindsey Dickson, female    DOB: [birth year], 77 y.o.   MRN: [id number]  HPI F/u HTN - high BP lately; she had to go to ER C/o stress at church F/u GERD/IBS   Review of Systems  Constitutional: Negative for chills, activity change, appetite change, fatigue and unexpected weight change.  HENT: Negative for congestion, mouth sores and sinus pressure.   Eyes: Negative for visual disturbance.  Respiratory: Negative for cough and chest tightness.   Gastrointestinal: Negative for nausea and abdominal pain.  Genitourinary: Negative for frequency, difficulty urinating and vaginal pain.  Musculoskeletal: Positive for back pain. Negative for gait problem.  Skin: Negative for pallor and rash.  Neurological: Negative for dizziness, tremors, weakness, numbness and headaches.  Psychiatric/Behavioral: Positive for sleep disturbance. Negative for suicidal ideas, behavioral problems, confusion and dysphoric mood. The patient is nervous/anxious.        Objective:   Physical Exam  Constitutional: She appears well-developed and well-nourished. No distress.  HENT:  Head: Normocephalic.  Right Ear: External ear normal.  Left Ear: External ear normal.  Nose: Nose normal.  Mouth/Throat: Oropharynx is clear and moist.  Eyes: Conjunctivae are normal. Pupils are equal, round, and reactive to light. Right eye exhibits no discharge. Left eye exhibits no discharge.  Neck: Normal range of motion. Neck supple. No JVD present. No tracheal deviation present. No thyromegaly present.  Cardiovascular: Normal rate, regular rhythm and normal heart sounds.   Pulmonary/Chest: No stridor. No respiratory distress. She has no wheezes.  Abdominal: Soft. Bowel sounds are normal. She exhibits no distension and no mass. There is no tenderness. There is no rebound and no guarding.  Musculoskeletal: She exhibits no edema and no tenderness.  Lymphadenopathy:    She has no cervical adenopathy.    Neurological: She displays normal reflexes. No cranial nerve deficit. She exhibits normal muscle tone. Coordination normal.  Skin: No rash noted. No erythema.  Psychiatric: She has a normal mood and affect. Her behavior is normal. Judgment and thought content normal.       A little anxious          Assessment & Plan:

## 2010-12-30 NOTE — Assessment & Plan Note (Signed)
Discussed.

## 2010-12-30 NOTE — Assessment & Plan Note (Signed)
We will try Valerian root

## 2010-12-30 NOTE — Patient Instructions (Addendum)
Take Amlodipine 1/2 tablet a day You can try Valerian root pills for anxiety/stress

## 2011-01-06 ENCOUNTER — Telehealth: Payer: Self-pay | Admitting: *Deleted

## 2011-01-06 NOTE — Telephone Encounter (Signed)
Pt left vm stating her BP med is not working, Her BP was 200/99. She is requesting advisement as to what she should do. I called pt- no answer.... Left mess for patient to call back.

## 2011-01-07 MED ORDER — LOSARTAN POTASSIUM 100 MG PO TABS: 100.0000 mg | ORAL_TABLET | Freq: Every day | ORAL | Status: DC

## 2011-01-07 NOTE — Telephone Encounter (Signed)
Cont Norvasc Add Cozaar 100 mg/d Keep ROV Thx

## 2011-01-08 NOTE — Telephone Encounter (Signed)
Pt left vm at 12:08 today stating her blood pressure is now 120/55 and she doesn't know what to do.Lindsey KitchenMarland KitchenMarland KitchenLeft mess for patient to call back.

## 2011-01-08 NOTE — Telephone Encounter (Signed)
Pt informed- she states her blood pressure. She denies any pain, nausea, dizziness. Pt advised to go to ER or UC if she develops symptoms. Pt understands.

## 2011-02-07 ENCOUNTER — Ambulatory Visit: Payer: Medicare Other | Admitting: Internal Medicine

## 2011-02-10 ENCOUNTER — Encounter: Payer: Self-pay | Admitting: Endocrinology

## 2011-02-10 ENCOUNTER — Ambulatory Visit (INDEPENDENT_AMBULATORY_CARE_PROVIDER_SITE_OTHER): Payer: Medicare Other | Admitting: Endocrinology

## 2011-02-10 DIAGNOSIS — I10 Essential (primary) hypertension: Secondary | ICD-10-CM

## 2011-02-10 MED ORDER — LOSARTAN POTASSIUM 25 MG PO TABS: 25.0000 mg | ORAL_TABLET | Freq: Every day | ORAL | Status: DC

## 2011-02-10 NOTE — Progress Notes (Signed)
Subjective:    Patient ID: Lindsey Dickson, female    DOB: Sep 11, 1920, 78 y.o.   MRN: [id number]  HPI Pt returns for f/u of HTN.  She has few mos of intermittent slight dizziness sensation in the head, in the context of taking cozaar, but no assoc loc.  She says this causes her to be able to take the cozaar only intermittently.  She takes norvasc 1/2 or 1 pill per day, depending on her blood pressure.   Past Medical History  Diagnosis Date  . IBS (irritable bowel syndrome)   . Constipation, chronic   . Allergic rhinitis   . CHF (congestive heart failure)   . Depression   . Diverticulitis   . GERD (gastroesophageal reflux disease)   . Hyperlipidemia   . LBP (low back pain)   . Colon polyps   . Hypertension     Past Surgical History  Procedure Date  . Appendectomy   . Tubal ligation   . Cataract extraction     History   Social History  . Marital Status: Single    Spouse Name: N/A    Number of Children: N/A  . Years of Education: N/A   Occupational History  . Not on file.   Social History Main Topics  . Smoking status: Never Smoker   . Smokeless tobacco: Not on file  . Alcohol Use: No  . Drug Use: No  . Sexually Active: Not on file   Other Topics Concern  . Not on file   Social History Narrative  . No narrative on file    Current Outpatient Prescriptions on File Prior to Visit  Medication Sig Dispense Refill  . amLODipine (NORVASC) 5 MG tablet Take 1 tablet (5 mg total) by mouth daily.  30 tablet  11  . Cholecalciferol 1000 UNITS tablet Take 1,000 Units by mouth daily.        [name] loratadine (CLARITIN) 10 MG tablet Take 10 mg by mouth daily as needed.       . magnesium hydroxide (MILK OF MAGNESIA) 400 MG/5ML suspension Take 7.5 mLs by mouth daily as needed.       . potassium chloride (KLOR-CON) 8 MEQ tablet Take 1 tablet (8 mEq total) by mouth daily.  30 tablet  11  . Probiotic Product (ALIGN) 4 MG CAPS Take by mouth daily.        [name] zolpidem (AMBIEN) 10 MG tablet          Allergies  Allergen Reactions  . Ciprofloxacin   . Hydrochlorothiazide     REACTION: low K  . Penicillins   . Sulfonamide Derivatives     Family History  Problem Relation Age of Onset  . Hypertension Mother   . Lung cancer Brother   . Cancer Brother     lung    BP 124/88  Pulse 84  Temp(Src) 97.4 F (36.3 C) (Oral)  Ht 5\' 1"  (1.549 m)  Wt 91 lb 12.8 oz (41.64 kg)  BMI 17.35 kg/m2  SpO2 95%   Review of Systems Denies headache and visual loss.      Objective:   Physical Exam VITAL SIGNS:  See vs page GENERAL: no distress LUNGS:  Clear to auscultation HEART:  Regular rate and rhythm without murmurs noted. Normal S1,S2.   Gait; normal and steady.       Assessment & Plan:  HTN, therapy limited by noncompliance with meds.  i'll do the best i can. Dizziness, new.  prob due to  intermittent med taking

## 2011-02-10 NOTE — Patient Instructions (Signed)
Take amlodipine 5 mg daily Take losartan only 25 mg daily.  i have sent a prescription to your pharmacy.  Please have a blood pressure check here in 2 weeks.   I hope you feel better soon.  If you don't feel better by next week, please call back.

## 2011-03-10 ENCOUNTER — Ambulatory Visit (INDEPENDENT_AMBULATORY_CARE_PROVIDER_SITE_OTHER): Payer: Medicare Other | Admitting: Internal Medicine

## 2011-03-10 ENCOUNTER — Encounter: Payer: Self-pay | Admitting: Internal Medicine

## 2011-03-10 DIAGNOSIS — F439 Reaction to severe stress, unspecified: Secondary | ICD-10-CM

## 2011-03-10 DIAGNOSIS — F3289 Other specified depressive episodes: Secondary | ICD-10-CM

## 2011-03-10 DIAGNOSIS — F329 Major depressive disorder, single episode, unspecified: Secondary | ICD-10-CM

## 2011-03-10 DIAGNOSIS — G4700 Insomnia, unspecified: Secondary | ICD-10-CM

## 2011-03-10 DIAGNOSIS — R143 Flatulence: Secondary | ICD-10-CM

## 2011-03-10 DIAGNOSIS — I10 Essential (primary) hypertension: Secondary | ICD-10-CM

## 2011-03-10 DIAGNOSIS — R1013 Epigastric pain: Secondary | ICD-10-CM

## 2011-03-10 DIAGNOSIS — R141 Gas pain: Secondary | ICD-10-CM

## 2011-03-10 DIAGNOSIS — Z733 Stress, not elsewhere classified: Secondary | ICD-10-CM

## 2011-03-10 DIAGNOSIS — F411 Generalized anxiety disorder: Secondary | ICD-10-CM

## 2011-03-10 NOTE — Progress Notes (Signed)
Patient ID: Lindsey Dickson, female   DOB: Jan 01, 1921, 78 y.o.   MRN: [id number]  Subjective:    Patient ID: Lindsey Dickson, female    DOB: [birth year], 78 y.o.   MRN: [id number]  GI Problem Primary symptoms do not include fatigue, abdominal pain, nausea or rash.  The illness is also significant for back pain. The illness does not include chills.   F/u HTN - high BP lately; she had to go to ER C/o stress at work F/u GERD/IBS Wt Readings from Last 3 Encounters:  03/10/11 91 lb 1.3 oz (41.314 kg)  02/10/11 91 lb 12.8 oz (41.64 kg)  12/30/10 94 lb (42.638 kg)     Review of Systems  Constitutional: Negative for chills, activity change, appetite change, fatigue and unexpected weight change.  HENT: Negative for congestion, mouth sores and sinus pressure.   Eyes: Negative for visual disturbance.  Respiratory: Negative for cough and chest tightness.   Gastrointestinal: Negative for nausea and abdominal pain.  Genitourinary: Negative for frequency, difficulty urinating and vaginal pain.  Musculoskeletal: Positive for back pain. Negative for gait problem.  Skin: Negative for pallor and rash.  Neurological: Negative for dizziness, tremors, weakness, numbness and headaches.  Psychiatric/Behavioral: Positive for sleep disturbance. Negative for suicidal ideas, behavioral problems, confusion and dysphoric mood. The patient is nervous/anxious.        Objective:   Physical Exam  Constitutional: She appears well-developed and well-nourished. No distress.  HENT:  Head: Normocephalic.  Right Ear: External ear normal.  Left Ear: External ear normal.  Nose: Nose normal.  Mouth/Throat: Oropharynx is clear and moist.  Eyes: Conjunctivae are normal. Pupils are equal, round, and reactive to light. Right eye exhibits no discharge. Left eye exhibits no discharge.  Neck: Normal range of motion. Neck supple. No JVD present. No tracheal deviation present. No thyromegaly present.  Cardiovascular: Normal rate,  regular rhythm and normal heart sounds.   Pulmonary/Chest: No stridor. No respiratory distress. She has no wheezes.  Abdominal: Soft. Bowel sounds are normal. She exhibits no distension and no mass. There is no tenderness. There is no rebound and no guarding.  Musculoskeletal: She exhibits no edema and no tenderness.  Lymphadenopathy:    She has no cervical adenopathy.  Neurological: She displays normal reflexes. No cranial nerve deficit. She exhibits normal muscle tone. Coordination normal.  Skin: No rash noted. No erythema.  Psychiatric: She has a normal mood and affect. Her behavior is normal. Judgment and thought content normal.       A little anxious          Assessment & Plan:

## 2011-03-10 NOTE — Assessment & Plan Note (Signed)
Continue with current prescription therapy as reflected on the Med list.  

## 2011-03-10 NOTE — Assessment & Plan Note (Signed)
IBS stress related, chronic  

## 2011-03-10 NOTE — Assessment & Plan Note (Signed)
IBS stress related, chronic

## 2011-03-10 NOTE — Assessment & Plan Note (Signed)
Needs to retire 2/13 stress at work

## 2011-03-10 NOTE — Assessment & Plan Note (Signed)
Chronic. She declined meds, councelling I advised her to retire

## 2011-03-10 NOTE — Assessment & Plan Note (Signed)
Chronic. She declined meds, councelling She needs to retire

## 2011-03-18 ENCOUNTER — Ambulatory Visit: Payer: Medicare Other | Admitting: Internal Medicine

## 2011-04-09 ENCOUNTER — Other Ambulatory Visit: Payer: Self-pay

## 2011-04-09 NOTE — Telephone Encounter (Signed)
Pt called requesting refill of Ambien

## 2011-04-24 ENCOUNTER — Encounter: Payer: Self-pay | Admitting: Internal Medicine

## 2011-04-24 ENCOUNTER — Ambulatory Visit (INDEPENDENT_AMBULATORY_CARE_PROVIDER_SITE_OTHER): Payer: Medicare Other | Admitting: Internal Medicine

## 2011-04-24 VITALS — BP 122/74 | HR 77 | Temp 97.8°F

## 2011-04-24 DIAGNOSIS — K589 Irritable bowel syndrome without diarrhea: Secondary | ICD-10-CM

## 2011-04-24 DIAGNOSIS — F439 Reaction to severe stress, unspecified: Secondary | ICD-10-CM

## 2011-04-24 DIAGNOSIS — Z733 Stress, not elsewhere classified: Secondary | ICD-10-CM

## 2011-04-24 DIAGNOSIS — J069 Acute upper respiratory infection, unspecified: Secondary | ICD-10-CM

## 2011-04-24 DIAGNOSIS — G4700 Insomnia, unspecified: Secondary | ICD-10-CM

## 2011-04-24 MED ORDER — LORAZEPAM 0.5 MG PO TABS: 0.5000 mg | ORAL_TABLET | Freq: Every evening | ORAL | Status: DC | PRN

## 2011-04-24 NOTE — Patient Instructions (Signed)
Use over-the-counter  "cold" medicines  such as  "Afrin" nasal spray for nasal congestion as directed instead. Use" Delsym" or" Robitussin" cough syrup varietis for cough.  You can use plain "Tylenol" or "Advi"l for fever, chills and achyness.   "Common cold" symptoms are usually triggered by a virus.  The antibiotics are usually not necessary. On average, a" viral cold" illness would take 4-7 days to resolve. Please, make an appointment if you are not better or if you're worse.  

## 2011-04-24 NOTE — Assessment & Plan Note (Signed)
OTC meds 

## 2011-04-24 NOTE — Assessment & Plan Note (Signed)
Continue with current OTC therapy (MOM x years; does not want to switch) 

## 2011-04-24 NOTE — Assessment & Plan Note (Addendum)
Will try Lorazepam Brand Ambien is too $$$

## 2011-04-24 NOTE — Assessment & Plan Note (Signed)
Better  

## 2011-04-24 NOTE — Progress Notes (Signed)
Patient ID: Lindsey Dickson, female   DOB: 15-Apr-1920, 78 y.o.   MRN: [id number] Patient ID: Lindsey Dickson, female   DOB: 1920-02-21, 78 y.o.   MRN: [id number]  Subjective:    Patient ID: Lindsey Dickson, female    DOB: 01-11-1921, 78 y.o.   MRN: [id number]  Sore Throat   Cough  C/o URI sx - a few days F/u HTN - doing well Less stress at work  F/u GERD/IBS - better  Wt Readings from Last 3 Encounters:  03/10/11 91 lb 1.3 oz (41.314 kg)  02/10/11 91 lb 12.8 oz (41.64 kg)  12/30/10 94 lb (42.638 kg)   BP Readings from Last 3 Encounters:  04/24/11 122/74  03/10/11 118/78  02/10/11 124/88     Review of Systems  Constitutional: Negative for activity change, appetite change and unexpected weight change.  HENT: Negative for mouth sores.   Eyes: Negative for visual disturbance.  Respiratory: Negative for chest tightness.   Genitourinary: Negative for difficulty urinating.  Musculoskeletal: Negative for gait problem.  Skin: Negative for pallor.  Neurological: Negative for tremors and numbness.  Psychiatric/Behavioral: Positive for sleep disturbance. Negative for suicidal ideas, behavioral problems, confusion and dysphoric mood. The patient is nervous/anxious.        Objective:   Physical Exam  Constitutional: She appears well-developed and well-nourished. No distress.  HENT:  Head: Normocephalic.  Right Ear: External ear normal.  Left Ear: External ear normal.  Nose: Nose normal.  Mouth/Throat: Oropharynx is clear and moist.  Eyes: Conjunctivae are normal. Pupils are equal, round, and reactive to light. Right eye exhibits no discharge. Left eye exhibits no discharge.  Neck: Normal range of motion. Neck supple. No JVD present. No tracheal deviation present. No thyromegaly present.  Cardiovascular: Normal rate, regular rhythm and normal heart sounds.   Pulmonary/Chest: No stridor. No respiratory distress. She has no wheezes.  Abdominal: Soft. Bowel sounds are normal. She exhibits no  distension and no mass. There is no tenderness. There is no rebound and no guarding.  Musculoskeletal: She exhibits no edema and no tenderness.  Lymphadenopathy:    She has no cervical adenopathy.  Neurological: She displays normal reflexes. No cranial nerve deficit. She exhibits normal muscle tone. Coordination normal.  Skin: No rash noted. No erythema.  Psychiatric: She has a normal mood and affect. Her behavior is normal. Judgment and thought content normal.       A little anxious   A little eryth throat  Lab Results  Component Value Date   WBC 5.4 12/28/2010   HGB 14.5 12/28/2010   HCT 43.3 12/28/2010   PLT 257 12/28/2010   GLUCOSE 112* 12/28/2010   CHOL 279* 12/20/2009   TRIG 169.0* 12/20/2009   HDL 65.20 12/20/2009   LDLDIRECT 177.6 12/20/2009   ALT 12 12/20/2009   AST 19 12/20/2009   NA 139 12/28/2010   K 3.3* 12/28/2010   CL 100 12/28/2010   CREATININE 0.73 12/28/2010   BUN 9 12/28/2010   CO2 30 12/28/2010   TSH 0.62 08/19/2010        Assessment & Plan:

## 2011-05-02 ENCOUNTER — Other Ambulatory Visit: Payer: Self-pay | Admitting: Internal Medicine

## 2011-05-02 NOTE — Telephone Encounter (Signed)
Pt states pharmacy states they call about refill on her ambien. Haven't heard from office. She is out of med and wanting refill today.... 05/02/11@12 :09pm/LMB

## 2011-05-09 ENCOUNTER — Ambulatory Visit: Payer: Medicare Other | Admitting: Internal Medicine

## 2011-05-26 ENCOUNTER — Telehealth: Payer: Self-pay

## 2011-05-26 NOTE — Telephone Encounter (Signed)
Pt called requesting Rx for Ambien. Pt says that ativan causes dizziness but Ambien does not, please advise.

## 2011-05-26 NOTE — Telephone Encounter (Signed)
Does it need to be a brand name Ambien? Thx

## 2011-05-27 ENCOUNTER — Other Ambulatory Visit: Payer: Self-pay

## 2011-05-27 ENCOUNTER — Ambulatory Visit: Payer: Medicare Other | Admitting: Internal Medicine

## 2011-05-27 MED ORDER — ZOLPIDEM TARTRATE 10 MG PO TABS: 10.0000 mg | ORAL_TABLET | Freq: Every evening | ORAL | Status: DC | PRN

## 2011-05-27 NOTE — Telephone Encounter (Signed)
Per patient, must be brand name necessary. New rx printed/signed  and given to patient. Per pt may need to contact insurance for PA. Pharmacy will let us know

## 2011-06-11 ENCOUNTER — Encounter: Payer: Self-pay | Admitting: Internal Medicine

## 2011-06-11 ENCOUNTER — Ambulatory Visit (INDEPENDENT_AMBULATORY_CARE_PROVIDER_SITE_OTHER): Payer: Medicare Other | Admitting: Internal Medicine

## 2011-06-11 VITALS — BP 120/60 | HR 76 | Temp 98.2°F | Resp 16 | Wt 90.0 lb

## 2011-06-11 DIAGNOSIS — K589 Irritable bowel syndrome without diarrhea: Secondary | ICD-10-CM

## 2011-06-11 DIAGNOSIS — I10 Essential (primary) hypertension: Secondary | ICD-10-CM

## 2011-06-11 DIAGNOSIS — R634 Abnormal weight loss: Secondary | ICD-10-CM

## 2011-06-11 DIAGNOSIS — G4700 Insomnia, unspecified: Secondary | ICD-10-CM

## 2011-06-11 MED ORDER — AMLODIPINE BESYLATE 5 MG PO TABS: 5.0000 mg | ORAL_TABLET | Freq: Every day | ORAL | Status: DC

## 2011-06-11 NOTE — Assessment & Plan Note (Signed)
Continue with current prescription therapy as reflected on the Med list.  

## 2011-06-11 NOTE — Assessment & Plan Note (Signed)
Diet discussed 

## 2011-06-11 NOTE — Assessment & Plan Note (Signed)
Discussed.

## 2011-06-11 NOTE — Progress Notes (Signed)
Patient ID: Lindsey Dickson, female   DOB: [birth year], 78 y.o.   MRN: [id number] Patient ID: Lindsey Dickson, female   DOB: 01-Jul-1920, 78 y.o.   MRN: [id number]  Subjective:    Patient ID: Lindsey Dickson, female    DOB: 19-Apr-1920, 78 y.o.   MRN: [id number]  HPI F/u HTN - high BP lately; she had to go to ER C/o stress at work F/u GERD/IBS C/o wt loss  Wt Readings from Last 3 Encounters:  06/11/11 90 lb (40.824 kg)  03/10/11 91 lb 1.3 oz (41.314 kg)  02/10/11 91 lb 12.8 oz (41.64 kg)   BP Readings from Last 3 Encounters:  06/11/11 120/60  04/24/11 122/74  03/10/11 118/78     Review of Systems  Constitutional: Negative for activity change, appetite change and unexpected weight change.  HENT: Negative for congestion, mouth sores and sinus pressure.   Eyes: Negative for visual disturbance.  Respiratory: Negative for cough and chest tightness.   Genitourinary: Negative for difficulty urinating.  Musculoskeletal: Negative for gait problem.  Skin: Negative for pallor.  Neurological: Negative for tremors, numbness and headaches.  Psychiatric/Behavioral: Positive for sleep disturbance. Negative for suicidal ideas, behavioral problems, confusion and dysphoric mood. The patient is nervous/anxious.        Objective:   Physical Exam  Constitutional: She appears well-developed and well-nourished. No distress.  HENT:  Head: Normocephalic.  Right Ear: External ear normal.  Left Ear: External ear normal.  Nose: Nose normal.  Mouth/Throat: Oropharynx is clear and moist.  Eyes: Conjunctivae are normal. Pupils are equal, round, and reactive to light. Right eye exhibits no discharge. Left eye exhibits no discharge.  Neck: Normal range of motion. Neck supple. No JVD present. No tracheal deviation present. No thyromegaly present.  Cardiovascular: Normal rate, regular rhythm and normal heart sounds.   Pulmonary/Chest: No stridor. No respiratory distress. She has no wheezes.  Abdominal: Soft. Bowel  sounds are normal. She exhibits no distension and no mass. There is no tenderness. There is no rebound and no guarding.  Musculoskeletal: She exhibits no edema and no tenderness.  Lymphadenopathy:    She has no cervical adenopathy.  Neurological: She displays normal reflexes. No cranial nerve deficit. She exhibits normal muscle tone. Coordination normal.  Skin: No rash noted. No erythema.  Psychiatric: She has a normal mood and affect. Her behavior is normal. Judgment and thought content normal.       A little anxious    Lab Results  Component Value Date   WBC 5.4 12/28/2010   HGB 14.5 12/28/2010   HCT 43.3 12/28/2010   PLT 257 12/28/2010   GLUCOSE 112* 12/28/2010   CHOL 279* 12/20/2009   TRIG 169.0* 12/20/2009   HDL 65.20 12/20/2009   LDLDIRECT 177.6 12/20/2009   ALT 12 12/20/2009   AST 19 12/20/2009   NA 139 12/28/2010   K 3.3* 12/28/2010   CL 100 12/28/2010   CREATININE 0.73 12/28/2010   BUN 9 12/28/2010   CO2 30 12/28/2010   TSH 0.62 08/19/2010         Assessment & Plan:

## 2011-06-17 MED ORDER — AMLODIPINE BESYLATE 5 MG PO TABS: 5.0000 mg | ORAL_TABLET | Freq: Every day | ORAL | Status: DC

## 2011-06-17 NOTE — Progress Notes (Signed)
Addended by: Deatra James on: 06/17/2011 01:56 PM   Modules accepted: Orders

## 2011-07-25 ENCOUNTER — Other Ambulatory Visit (INDEPENDENT_AMBULATORY_CARE_PROVIDER_SITE_OTHER): Payer: Medicare Other

## 2011-07-25 ENCOUNTER — Ambulatory Visit (INDEPENDENT_AMBULATORY_CARE_PROVIDER_SITE_OTHER): Payer: Medicare Other | Admitting: Internal Medicine

## 2011-07-25 ENCOUNTER — Encounter: Payer: Self-pay | Admitting: Internal Medicine

## 2011-07-25 VITALS — BP 120/70 | HR 84 | Temp 98.5°F | Resp 16 | Wt 91.0 lb

## 2011-07-25 DIAGNOSIS — F439 Reaction to severe stress, unspecified: Secondary | ICD-10-CM

## 2011-07-25 DIAGNOSIS — E785 Hyperlipidemia, unspecified: Secondary | ICD-10-CM

## 2011-07-25 DIAGNOSIS — I10 Essential (primary) hypertension: Secondary | ICD-10-CM

## 2011-07-25 DIAGNOSIS — G4700 Insomnia, unspecified: Secondary | ICD-10-CM

## 2011-07-25 DIAGNOSIS — Z0000 Encounter for general adult medical examination without abnormal findings: Secondary | ICD-10-CM

## 2011-07-25 DIAGNOSIS — F430 Acute stress reaction: Secondary | ICD-10-CM

## 2011-07-25 DIAGNOSIS — R634 Abnormal weight loss: Secondary | ICD-10-CM

## 2011-07-25 DIAGNOSIS — K589 Irritable bowel syndrome without diarrhea: Secondary | ICD-10-CM

## 2011-07-25 DIAGNOSIS — K219 Gastro-esophageal reflux disease without esophagitis: Secondary | ICD-10-CM

## 2011-07-25 LAB — BASIC METABOLIC PANEL
CO2: 31 mEq/L (ref 19–32)
Calcium: 9.2 mg/dL (ref 8.4–10.5)
Creatinine, Ser: 1 mg/dL (ref 0.4–1.2)
GFR: 71.15 mL/min (ref >60.00)

## 2011-07-25 LAB — TSH: TSH: 0.4 u[IU]/mL (ref 0.35–5.50)

## 2011-07-25 NOTE — Assessment & Plan Note (Addendum)
The patient is here for annual Medicare wellness examination and management of other chronic and acute problems.   The risk factors are reflected in the social history.  The roster of all physicians providing medical care to patient - is listed in the Snapshot section of the chart.  Activities of daily living:  The patient is 100% inedpendent in all ADLs: dressing, toileting, feeding as well as independent mobility  Home safety : The patient has smoke detectors in the home. They wear seatbelts. There is no violence in the home.   There is no risks for hepatitis, STDs or HIV. There is no   history of blood transfusion. They have no travel history to infectious disease endemic areas of the world.  The patient has  seen their dentist in the last 12 month. They have  seen their eye doctor in the last year. They deny  Any major hearing difficulty and have not had audiologic testing in the last year.  They do not  have excessive sun exposure. Discussed the need for sun protection: hats, long sleeves and use of sunscreen if there is significant sun exposure.   Diet: the importance of a healthy diet is discussed. They do have a reasonably healthy  diet.  The patient has a fairly regular exercise program of a mixed nature: walking, yard work, etc.The benefits of regular aerobic exercise were discussed.  Depression screen: there are no signs or vegative symptoms of depression- irritability, change in appetite, anhedonia, sadness/tearfullness.  Cognitive assessment: the patient manages all their financial and personal affairs and is actively engaged. They could relate day,date,year and events; recalled 3/3 objects at 3 minutes  The following portions of the patient's history were reviewed and updated as appropriate: allergies, current medications, past family history, past medical history,  past surgical history, past social history  and problem list.  Vision, hearing, body mass index were assessed and  reviewed.   During the course of the visit the patient was educated and counseled about appropriate screening and preventive services including : fall prevention , diabetes screening, nutrition counseling, colorectal cancer screening, and recommended immunizations.  Declined shingles shot, PAP, colonoscopy

## 2011-07-25 NOTE — Progress Notes (Signed)
   Subjective:    Patient ID: Lindsey Dickson, female    DOB: [birth year], 78 y.o.   MRN: [id number]  HPI The patient is here for a wellness exam. The patient has been doing well overall without major physical or psychological issues going on lately.   Wt Readings from Last 3 Encounters:  07/25/11 91 lb (41.277 kg)  06/11/11 90 lb (40.824 kg)  03/10/11 91 lb 1.3 oz (41.314 kg)   BP Readings from Last 3 Encounters:  07/25/11 120/70  06/11/11 120/60  04/24/11 122/74     Review of Systems  Constitutional: Negative for activity change, appetite change and unexpected weight change.  HENT: Negative for congestion, mouth sores and sinus pressure.   Eyes: Negative for visual disturbance.  Respiratory: Negative for cough and chest tightness.   Genitourinary: Negative for difficulty urinating.  Musculoskeletal: Negative for gait problem.  Skin: Negative for pallor.  Neurological: Negative for tremors, numbness and headaches.  Psychiatric/Behavioral: Positive for disturbed wake/sleep cycle. Negative for suicidal ideas, behavioral problems, confusion and dysphoric mood. The patient is nervous/anxious.        Objective:   Physical Exam  Constitutional: She appears well-developed and well-nourished. No distress.  HENT:  Head: Normocephalic.  Right Ear: External ear normal.  Left Ear: External ear normal.  Nose: Nose normal.  Mouth/Throat: Oropharynx is clear and moist.  Eyes: Conjunctivae are normal. Pupils are equal, round, and reactive to light. Right eye exhibits no discharge. Left eye exhibits no discharge.  Neck: Normal range of motion. Neck supple. No JVD present. No tracheal deviation present. No thyromegaly present.  Cardiovascular: Normal rate, regular rhythm and normal heart sounds.   Pulmonary/Chest: No stridor. No respiratory distress. She has no wheezes.  Abdominal: Soft. Bowel sounds are normal. She exhibits no distension and no mass. There is no tenderness. There is no  rebound and no guarding.  Musculoskeletal: She exhibits no edema and no tenderness.  Lymphadenopathy:    She has no cervical adenopathy.  Neurological: She displays normal reflexes. No cranial nerve deficit. She exhibits normal muscle tone. Coordination normal.  Skin: No rash noted. No erythema.  Psychiatric: She has a normal mood and affect. Her behavior is normal. Judgment and thought content normal.       A little anxious    Lab Results  Component Value Date   WBC 5.4 12/28/2010   HGB 14.5 12/28/2010   HCT 43.3 12/28/2010   PLT 257 12/28/2010   GLUCOSE 112* 12/28/2010   CHOL 279* 12/20/2009   TRIG 169.0* 12/20/2009   HDL 65.20 12/20/2009   LDLDIRECT 177.6 12/20/2009   ALT 12 12/20/2009   AST 19 12/20/2009   NA 139 12/28/2010   K 3.3* 12/28/2010   CL 100 12/28/2010   CREATININE 0.73 12/28/2010   BUN 9 12/28/2010   CO2 30 12/28/2010   TSH 0.62 08/19/2010         Assessment & Plan:

## 2011-07-28 ENCOUNTER — Telehealth: Payer: Self-pay | Admitting: *Deleted

## 2011-07-28 NOTE — Telephone Encounter (Signed)
Left detailed mess informing pt of below.  

## 2011-07-28 NOTE — Telephone Encounter (Signed)
Message copied by Merrilyn Puma on Mon Jul 28, 2011 11:24 AM ------      Message from: Tresa Garter      Created: Fri Jul 25, 2011  5:35 PM       Misty Stanley, please, inform patient that all labs are OK      Thank you!

## 2011-07-29 NOTE — Assessment & Plan Note (Signed)
Discussed.

## 2011-07-29 NOTE — Assessment & Plan Note (Signed)
Pepcid prn.

## 2011-07-29 NOTE — Assessment & Plan Note (Signed)
  On diet  

## 2011-07-29 NOTE — Assessment & Plan Note (Signed)
Continue with current prescription therapy as reflected on the Med list.  

## 2011-08-25 ENCOUNTER — Telehealth: Payer: Self-pay | Admitting: Internal Medicine

## 2011-08-25 NOTE — Telephone Encounter (Signed)
OK to stop KCl Eat 1 banana a day Thx

## 2011-08-25 NOTE — Telephone Encounter (Signed)
Caller: Lindsey Dickson/Patient; PCP: Plotnikov, Alex; CB#: [phone]; ; ; Call regarding Potassium Pill Makes Her Nauseated;   08-25-11 she said her potassium pills are making her nauseated.  She does take them after breakfast.  I made sure she stays upright after taking them  She said she does    I also reviewed foods with her that are good sources of potassium that she could start eating.  She is going to get an over the counter Prilosec or Pepcid to try and see if that helps with her nausea.  I told her if her symptoms per

## 2011-08-26 NOTE — Telephone Encounter (Signed)
Left message on machine for pt to return my call  

## 2011-08-27 NOTE — Telephone Encounter (Signed)
Pt advised and expressed understanding.

## 2011-09-24 ENCOUNTER — Telehealth: Payer: Self-pay | Admitting: Internal Medicine

## 2011-09-24 NOTE — Telephone Encounter (Signed)
Pt called to say her medication is making her nauseated.  It is the Amlodipinebeslate 5 mg.  She takes 1/2 in am and 1/2 in the pm.  She says they are very hard to cut in half. She said the other one she took was soft and melted in her mouth and was easy to cut.  It didn't make her sick.   She wants it sent to [facility] on [id number] and [address].

## 2011-09-25 NOTE — Telephone Encounter (Signed)
I haven't changed anything. She should check w/her pharmacist: it is possible she was given Rx made by  Different manufacturer. Thx

## 2011-09-25 NOTE — Telephone Encounter (Signed)
Pt wants to speak with someone because she thinks her medication was changed and now it is making her sick and she will not take it anymore, she wants her previous medication called in to [facility] on [address]; [phone]

## 2011-09-26 NOTE — Telephone Encounter (Signed)
I called Lindsey Dickson.  She got medicine from [facility] and it hasn't made her sick anymore.  She says she is doing ok and taking her medication.

## 2011-09-28 NOTE — Telephone Encounter (Signed)
Noted. Thx.

## 2011-10-15 ENCOUNTER — Ambulatory Visit: Payer: Medicare Other | Admitting: Internal Medicine

## 2011-10-17 ENCOUNTER — Ambulatory Visit (INDEPENDENT_AMBULATORY_CARE_PROVIDER_SITE_OTHER): Payer: Medicare Other | Admitting: Internal Medicine

## 2011-10-17 ENCOUNTER — Encounter: Payer: Self-pay | Admitting: Internal Medicine

## 2011-10-17 VITALS — BP 138/72 | HR 84 | Temp 97.2°F | Resp 16 | Wt 92.0 lb

## 2011-10-17 DIAGNOSIS — I10 Essential (primary) hypertension: Secondary | ICD-10-CM

## 2011-10-17 DIAGNOSIS — F419 Anxiety disorder, unspecified: Secondary | ICD-10-CM

## 2011-10-17 DIAGNOSIS — G4700 Insomnia, unspecified: Secondary | ICD-10-CM

## 2011-10-17 DIAGNOSIS — F411 Generalized anxiety disorder: Secondary | ICD-10-CM

## 2011-10-17 DIAGNOSIS — R634 Abnormal weight loss: Secondary | ICD-10-CM

## 2011-10-17 DIAGNOSIS — R21 Rash and other nonspecific skin eruption: Secondary | ICD-10-CM

## 2011-10-17 MED ORDER — TRIAMCINOLONE ACETONIDE 0.5 % EX CREA: TOPICAL_CREAM | Freq: Three times a day (TID) | CUTANEOUS | Status: DC

## 2011-10-17 MED ORDER — LORAZEPAM 0.5 MG PO TABS: 0.5000 mg | ORAL_TABLET | Freq: Every evening | ORAL | Status: DC | PRN

## 2011-10-17 NOTE — Assessment & Plan Note (Signed)
Continue with current prescription therapy as reflected on the Med list.  

## 2011-10-17 NOTE — Progress Notes (Signed)
Patient ID: Lindsey Dickson, female   DOB: August 19, 1920, 78 y.o.   MRN: [id number]   Subjective:    Patient ID: Lindsey Dickson, female    DOB: [birth year], 78 y.o.   MRN: [id number]  HPI C/o rash on L forearm x 1 week. F/u insomnia, HTN The patient has been doing well overall without major physical or psychological issues going on lately.   Wt Readings from Last 3 Encounters:  10/17/11 92 lb (41.731 kg)  07/25/11 91 lb (41.277 kg)  06/11/11 90 lb (40.824 kg)   BP Readings from Last 3 Encounters:  10/17/11 138/72  07/25/11 120/70  06/11/11 120/60     Review of Systems  Constitutional: Negative for activity change, appetite change and unexpected weight change.  HENT: Negative for congestion, mouth sores and sinus pressure.   Eyes: Negative for visual disturbance.  Respiratory: Negative for cough and chest tightness.   Genitourinary: Negative for difficulty urinating.  Musculoskeletal: Negative for gait problem.  Skin: Negative for pallor.  Neurological: Negative for tremors, numbness and headaches.  Psychiatric/Behavioral: Positive for disturbed wake/sleep cycle. Negative for suicidal ideas, behavioral problems, confusion and dysphoric mood. The patient is nervous/anxious.        Objective:   Physical Exam  Constitutional: She appears well-developed and well-nourished. No distress.  HENT:  Head: Normocephalic.  Right Ear: External ear normal.  Left Ear: External ear normal.  Nose: Nose normal.  Mouth/Throat: Oropharynx is clear and moist.  Eyes: Conjunctivae normal are normal. Pupils are equal, round, and reactive to light. Right eye exhibits no discharge. Left eye exhibits no discharge.  Neck: Normal range of motion. Neck supple. No JVD present. No tracheal deviation present. No thyromegaly present.  Cardiovascular: Normal rate, regular rhythm and normal heart sounds.   Pulmonary/Chest: No stridor. No respiratory distress. She has no wheezes.  Abdominal: Soft. Bowel sounds are  normal. She exhibits no distension and no mass. There is no tenderness. There is no rebound and no guarding.  Musculoskeletal: She exhibits no edema and no tenderness.  Lymphadenopathy:    She has no cervical adenopathy.  Neurological: She displays normal reflexes. No cranial nerve deficit. She exhibits normal muscle tone. Coordination normal.  Skin: No rash noted. No erythema.  Psychiatric: She has a normal mood and affect. Her behavior is normal. Judgment and thought content normal.       A little anxious  Two 4x6 mm eryth papules on L forearm  Lab Results  Component Value Date   WBC 5.4 12/28/2010   HGB 14.5 12/28/2010   HCT 43.3 12/28/2010   PLT 257 12/28/2010   GLUCOSE 123* 07/25/2011   CHOL 279* 12/20/2009   TRIG 169.0* 12/20/2009   HDL 65.20 12/20/2009   LDLDIRECT 177.6 12/20/2009   ALT 12 12/20/2009   AST 19 12/20/2009   NA 139 07/25/2011   K 3.7 07/25/2011   CL 101 07/25/2011   CREATININE 1.0 07/25/2011   BUN 13 07/25/2011   CO2 31 07/25/2011   TSH 0.40 07/25/2011         Assessment & Plan:

## 2011-10-17 NOTE — Assessment & Plan Note (Signed)
Lorazepam prn 

## 2011-10-17 NOTE — Assessment & Plan Note (Signed)
Wt Readings from Last 3 Encounters:  10/17/11 92 lb (41.731 kg)  07/25/11 91 lb (41.277 kg)  06/11/11 90 lb (40.824 kg)   Gained wt

## 2011-10-19 ENCOUNTER — Encounter: Payer: Self-pay | Admitting: Internal Medicine

## 2011-10-27 ENCOUNTER — Ambulatory Visit: Payer: Medicare Other | Admitting: Internal Medicine

## 2011-10-29 ENCOUNTER — Ambulatory Visit: Payer: Medicare Other | Admitting: Internal Medicine

## 2011-10-30 ENCOUNTER — Ambulatory Visit: Payer: Medicare Other | Admitting: Internal Medicine

## 2011-10-31 ENCOUNTER — Encounter: Payer: Self-pay | Admitting: Internal Medicine

## 2011-10-31 ENCOUNTER — Ambulatory Visit (INDEPENDENT_AMBULATORY_CARE_PROVIDER_SITE_OTHER): Payer: Medicare Other | Admitting: Internal Medicine

## 2011-10-31 VITALS — BP 120/70 | HR 84 | Temp 98.4°F | Resp 16 | Wt 91.0 lb

## 2011-10-31 DIAGNOSIS — R21 Rash and other nonspecific skin eruption: Secondary | ICD-10-CM

## 2011-10-31 MED ORDER — METHYLPREDNISOLONE ACETATE 80 MG/ML IJ SUSP
120.0000 mg | Freq: Once | INTRAMUSCULAR | Status: AC
  Administered 2011-10-31: 120 mg via INTRAMUSCULAR

## 2011-10-31 MED ORDER — HYDROXYZINE HCL 25 MG PO TABS: 12.5000 mg | ORAL_TABLET | Freq: Three times a day (TID) | ORAL | Status: DC | PRN

## 2011-10-31 NOTE — Progress Notes (Signed)
   Subjective:    Patient ID: Lindsey Dickson, female    DOB: 08-17-1920, 78 y.o.   MRN: [id number]  Rash Pertinent negatives include no congestion or cough.   C/o rash on L forearm 6 weeks - better. The rash is now on B legs and back - itching bad.  F/u insomnia, HTN The patient has been doing well overall without major physical or psychological issues going on lately.   Wt Readings from Last 3 Encounters:  10/31/11 91 lb (41.277 kg)  10/17/11 92 lb (41.731 kg)  07/25/11 91 lb (41.277 kg)   BP Readings from Last 3 Encounters:  10/31/11 120/70  10/17/11 138/72  07/25/11 120/70     Review of Systems  Constitutional: Negative for activity change, appetite change and unexpected weight change.  HENT: Negative for congestion, mouth sores and sinus pressure.   Eyes: Negative for visual disturbance.  Respiratory: Negative for cough and chest tightness.   Genitourinary: Negative for difficulty urinating.  Musculoskeletal: Negative for gait problem.  Skin: Positive for rash. Negative for pallor.  Neurological: Negative for tremors, numbness and headaches.  Psychiatric/Behavioral: Positive for disturbed wake/sleep cycle. Negative for suicidal ideas, behavioral problems, confusion and dysphoric mood. The patient is nervous/anxious.        Objective:   Physical Exam  Constitutional: She appears well-developed and well-nourished. No distress.  HENT:  Head: Normocephalic.  Right Ear: External ear normal.  Left Ear: External ear normal.  Nose: Nose normal.  Mouth/Throat: Oropharynx is clear and moist.  Eyes: Conjunctivae normal are normal. Pupils are equal, round, and reactive to light. Right eye exhibits no discharge. Left eye exhibits no discharge.  Neck: Normal range of motion. Neck supple. No JVD present. No tracheal deviation present. No thyromegaly present.  Cardiovascular: Normal rate, regular rhythm and normal heart sounds.   Pulmonary/Chest: No stridor. No respiratory distress.  She has no wheezes.  Abdominal: Soft. Bowel sounds are normal. She exhibits no distension and no mass. There is no tenderness. There is no rebound and no guarding.  Musculoskeletal: She exhibits no edema and no tenderness.  Lymphadenopathy:    She has no cervical adenopathy.  Neurological: She displays normal reflexes. No cranial nerve deficit. She exhibits normal muscle tone. Coordination normal.  Skin: No rash noted. No erythema.  Psychiatric: She has a normal mood and affect. Her behavior is normal. Judgment and thought content normal.       A little anxious  Some eryth papules on L forearm and on B LEs - several papules of 3-5 mm  Lab Results  Component Value Date   WBC 5.4 12/28/2010   HGB 14.5 12/28/2010   HCT 43.3 12/28/2010   PLT 257 12/28/2010   GLUCOSE 123* 07/25/2011   CHOL 279* 12/20/2009   TRIG 169.0* 12/20/2009   HDL 65.20 12/20/2009   LDLDIRECT 177.6 12/20/2009   ALT 12 12/20/2009   AST 19 12/20/2009   NA 139 07/25/2011   K 3.7 07/25/2011   CL 101 07/25/2011   CREATININE 1.0 07/25/2011   BUN 13 07/25/2011   CO2 31 07/25/2011   TSH 0.40 07/25/2011         Assessment & Plan:

## 2011-11-03 ENCOUNTER — Encounter: Payer: Self-pay | Admitting: Internal Medicine

## 2011-11-03 ENCOUNTER — Telehealth: Payer: Self-pay

## 2011-11-03 NOTE — Assessment & Plan Note (Signed)
10/13 - more rash on B LEs mostly below the knees - probable insect bites - ?fleas Depo 120 mg im Triamc cream 0.5% tid

## 2011-11-03 NOTE — Telephone Encounter (Signed)
[facility] Triage Call Report Triage Record Num: [id number] Operator: Valene Bors Patient Name: Lindsey Dickson Call Date & Time: 11/01/2011 2:24:32PM Patient Phone: [phone] PCP: Sonda Primes Patient Gender: Female PCP Fax : [phone] Patient DOB: 1920/10/25 Practice Name: [facility] Reason for Call: Caller: Gitty/Patient; PCP: Sonda Primes (Adults only); CB#: [phone]; Call regarding ithcy rash on legs and on top of feet- small red bumps/Could be flea bites. She was seen in office on last week and given Tramcinolone Cream and seen again 10/31/11 and given Prednisone shot. She is still itchy- cream helps some. Triage per Bites and Stings Protocol and Home Care Advice given. Advised to call back if symptoms not better by 11/04/11. Protocol(s) Used: Bites and Stings - Insects or Spiders Recommended Outcome per Protocol: Provide Home/Self Care Reason for Outcome: Itchy bites Care Advice: Topical corticosteroid cream/gel may be used as directed on label or by pharmacist. DO NOT apply a dressing to the area. ~ Call EMS 911 if any of the following occur within 24 hours of bite/sting: loss of consciousness, sudden onset of difficulty breathing or wheezing, chest pain or tightness, throat tightness, severe swelling of parts of the body (e.g., eyes, lips, or tongue) other than bite/sting site, abdominal cramps. ~ If, more than 24 hours after the incident, the sting/bite site or area around sting/bite site becomes increasingly swollen, red or painful, or has a purulent or foul smelling discharge, or red streaks develop leading away from the site, call provider immediately. ~ Apply cloth-covered ice pack or a cool compress to the area for no more than 20 minutes 4-8 times a day while awake to reduce pain and swelling. ~ ~ HEALTH PROMOTION / MAINTENANCE ~ SYMPTOM / CONDITION MANAGEMENT ITCHING RELIEF: - Avoid scratching; may cause further irritation and secondary  infection - Take cool showers or baths to relieve itching - If cool water alone does not relieve itching, try adding 1/2 to 1 cup baking soda or colloidal oatmeal (Aveeno) to bath water - Follow with application of a bland lotion such as calamine (do not apply to the eyes or genitals) ~ Consider use of a nonprescription oral antihistamine to relieve itching as directed on label or by pharmacist. Be aware that these medications may cause drowsiness and should be taken with caution by older adults. ~ 11/01/2011 2:54:12PM Page 1 of 1 [id number]

## 2011-11-10 ENCOUNTER — Telehealth: Payer: Self-pay | Admitting: Internal Medicine

## 2011-11-10 NOTE — Telephone Encounter (Signed)
Caller: Cherylann/Patient; Patient Name: Lindsey Dickson; PCP: Plotnikov, Alex (Adults only); Best Callback Phone Number: [phone].  Pt was is prescribed Ambien for sleep and has been taking for 10 years per pt.   Pt recently had refill  at a new pharmacy and pt states the medication is making her feel bad.  Pt states the medication is making her feel bad(groggy) all day long.  Pt has called the pharmacy and pharmacy states that she does regular precription not the generic ( Pt states the generic makes her feel this way but the regular prescription does not make her feel this way).  Pt just wanted to let doctor know that the medication was affecting her this way.  Pt does state she is only taking 1/2 of a tablet and she is still feeling groggy. Traiged patient per Sleep Disorders.  See Provider within 72 hours Disposition for 'Chronic, severe, daytime fatigue related to sleeplessness and not previously evaluated'.  Appt scheduled for 10/23 at 11:15 with Dr Posey Rea.

## 2011-11-12 ENCOUNTER — Ambulatory Visit: Payer: Medicare Other | Admitting: Internal Medicine

## 2011-11-19 ENCOUNTER — Telehealth: Payer: Self-pay | Admitting: Internal Medicine

## 2011-11-19 MED ORDER — ESZOPICLONE 2 MG PO TABS: 2.0000 mg | ORAL_TABLET | Freq: Every day | ORAL | Status: DC

## 2011-11-19 NOTE — Telephone Encounter (Signed)
Ambein makes her dizzy.  She is afraid of the side effects. She wants something else to help her sleep.  She hasn't slept in 2 weeks.

## 2011-11-19 NOTE — Telephone Encounter (Signed)
Try Lunesta See Rx Thx

## 2011-11-20 MED ORDER — ZOLPIDEM TARTRATE 10 MG PO TABS: 10.0000 mg | ORAL_TABLET | Freq: Every evening | ORAL | Status: DC | PRN

## 2011-11-20 NOTE — Telephone Encounter (Signed)
Pt informed she states she is not going to take a new medication and she don't know anything about it. She states she cant take generic meds. She is requesting Brand name Ambien. Ok?

## 2011-11-20 NOTE — Telephone Encounter (Signed)
OK brand Ambien - this is how it has been prescribed before Thx

## 2011-11-20 NOTE — Telephone Encounter (Signed)
Pt called back- she states she only wants # 5 or so because she would like to try to stop taking them all together.

## 2011-11-20 NOTE — Telephone Encounter (Signed)
Done

## 2011-11-28 ENCOUNTER — Encounter: Payer: Self-pay | Admitting: Internal Medicine

## 2011-11-28 ENCOUNTER — Ambulatory Visit (INDEPENDENT_AMBULATORY_CARE_PROVIDER_SITE_OTHER): Payer: Medicare Other | Admitting: Internal Medicine

## 2011-11-28 VITALS — BP 118/70 | HR 80 | Temp 98.7°F | Resp 16 | Wt 89.0 lb

## 2011-11-28 DIAGNOSIS — F419 Anxiety disorder, unspecified: Secondary | ICD-10-CM

## 2011-11-28 DIAGNOSIS — F411 Generalized anxiety disorder: Secondary | ICD-10-CM

## 2011-11-28 DIAGNOSIS — M545 Low back pain: Secondary | ICD-10-CM

## 2011-11-28 DIAGNOSIS — E785 Hyperlipidemia, unspecified: Secondary | ICD-10-CM

## 2011-11-28 DIAGNOSIS — R21 Rash and other nonspecific skin eruption: Secondary | ICD-10-CM

## 2011-11-28 DIAGNOSIS — I10 Essential (primary) hypertension: Secondary | ICD-10-CM

## 2011-11-28 DIAGNOSIS — F329 Major depressive disorder, single episode, unspecified: Secondary | ICD-10-CM

## 2011-11-28 DIAGNOSIS — R634 Abnormal weight loss: Secondary | ICD-10-CM

## 2011-11-28 MED ORDER — FAMOTIDINE 20 MG PO TABS: 20.0000 mg | ORAL_TABLET | Freq: Two times a day (BID) | ORAL | Status: DC | PRN

## 2011-11-28 NOTE — Assessment & Plan Note (Signed)
Doing well 

## 2011-11-28 NOTE — Progress Notes (Signed)
   Subjective:    Patient ID: Lindsey Dickson, female    DOB: 11-Oct-1920, 78 y.o.   MRN: [id number]  HPI C/o rash on L forearm 6 weeks - resolved The rash is now on B legs and back - resolved  F/u insomnia, HTN The patient has been doing well overall without major physical or psychological issues going on lately. C/o wt loss she has been limiting her foods - ie NAS diet...   Wt Readings from Last 3 Encounters:  11/28/11 89 lb (40.37 kg)  10/31/11 91 lb (41.277 kg)  10/17/11 92 lb (41.731 kg)   BP Readings from Last 3 Encounters:  11/28/11 118/70  10/31/11 120/70  10/17/11 138/72     Review of Systems  Constitutional: Negative for activity change, appetite change and unexpected weight change.  HENT: Negative for mouth sores and sinus pressure.   Eyes: Negative for visual disturbance.  Respiratory: Negative for chest tightness.   Genitourinary: Negative for difficulty urinating.  Musculoskeletal: Negative for gait problem.  Skin: Negative for pallor.  Neurological: Negative for tremors, numbness and headaches.  Psychiatric/Behavioral: Positive for sleep disturbance. Negative for suicidal ideas, behavioral problems, confusion and dysphoric mood. The patient is nervous/anxious.        Objective:   Physical Exam  Constitutional: She appears well-developed and well-nourished. No distress.  HENT:  Head: Normocephalic.  Right Ear: External ear normal.  Left Ear: External ear normal.  Nose: Nose normal.  Mouth/Throat: Oropharynx is clear and moist.  Eyes: Conjunctivae normal are normal. Pupils are equal, round, and reactive to light. Right eye exhibits no discharge. Left eye exhibits no discharge.  Neck: Normal range of motion. Neck supple. No JVD present. No tracheal deviation present. No thyromegaly present.  Cardiovascular: Normal rate, regular rhythm and normal heart sounds.   Pulmonary/Chest: No stridor. No respiratory distress. She has no wheezes.  Abdominal: Soft. Bowel  sounds are normal. She exhibits no distension and no mass. There is no tenderness. There is no rebound and no guarding.  Musculoskeletal: She exhibits no edema and no tenderness.  Lymphadenopathy:    She has no cervical adenopathy.  Neurological: She displays normal reflexes. No cranial nerve deficit. She exhibits normal muscle tone. Coordination normal.  Skin: No rash noted. No erythema.  Psychiatric: She has a normal mood and affect. Her behavior is normal. Judgment and thought content normal.       A little anxious     Lab Results  Component Value Date   WBC 5.4 12/28/2010   HGB 14.5 12/28/2010   HCT 43.3 12/28/2010   PLT 257 12/28/2010   GLUCOSE 123* 07/25/2011   CHOL 279* 12/20/2009   TRIG 169.0* 12/20/2009   HDL 65.20 12/20/2009   LDLDIRECT 177.6 12/20/2009   ALT 12 12/20/2009   AST 19 12/20/2009   NA 139 07/25/2011   K 3.7 07/25/2011   CL 101 07/25/2011   CREATININE 1.0 07/25/2011   BUN 13 07/25/2011   CO2 31 07/25/2011   TSH 0.40 07/25/2011         Assessment & Plan:

## 2011-11-28 NOTE — Assessment & Plan Note (Signed)
Resolved

## 2011-11-28 NOTE — Assessment & Plan Note (Signed)
Continue with current prescription therapy as reflected on the Med list.  

## 2011-11-28 NOTE — Assessment & Plan Note (Signed)
Lorazepam prn 

## 2011-11-28 NOTE — Assessment & Plan Note (Signed)
High calorie diet adviced

## 2011-11-30 ENCOUNTER — Encounter: Payer: Self-pay | Admitting: Internal Medicine

## 2011-12-12 ENCOUNTER — Telehealth: Payer: Self-pay | Admitting: Internal Medicine

## 2011-12-12 NOTE — Telephone Encounter (Signed)
Patient Information:  Caller Name: Calee  Phone: [phone]  Patient: Lindsey Dickson  Gender: Female  DOB: 05-03-1920  Age: 78 Years  PCP: Plotnikov, Alex (Adults only)   Symptoms  Reason For Call & Symptoms: Patient states she takes Norvasc 5mg  daily . She states for the last last week it has been low.  B/p today 107/71.  LOV 11/28/2011.  She feels lighheaded. She does not feel that she would pass out. She has been out driving this morning , baking a cake.  Reviewed Health History In EMR: Yes  Reviewed Medications In EMR: Yes  Reviewed Allergies In EMR: Yes  Date of Onset of Symptoms: 12/08/2011  Guideline(s) Used:  High Blood Pressure  Disposition Per Guideline:   Discuss with PCP and Callback by Nurse Today  Reason For Disposition Reached:   Taking BP medications and feels is having side effects (e.g., impotence, cough, dizziness)  Advice Given:  Call Back If:  Headache, blurred vision, difficulty talking, or difficulty walking occurs  Chest pain or difficulty breathing occurs  You become worse.  BP less than 120 / 80   This is considered normal blood pressure  Office Follow Up:  Does the office need to follow up with this patient?: Yes  Instructions For The Office: Please contact patient concerning blood pressure-  RN Note:  Please contact patient about her medication. Low blood pressure this week on Norvasc.  Advised to rest, feet up, drink fluids.  Slow stand up from seated position. Would forward concerns to the office.

## 2011-12-12 NOTE — Telephone Encounter (Signed)
Take norvasc 1/2 tab/day  Thx

## 2011-12-12 NOTE — Telephone Encounter (Signed)
Pt advised - she will continue to monitor BP and call back with readings in 5 days.

## 2012-01-26 ENCOUNTER — Other Ambulatory Visit: Payer: Self-pay | Admitting: Internal Medicine

## 2012-03-02 ENCOUNTER — Ambulatory Visit (INDEPENDENT_AMBULATORY_CARE_PROVIDER_SITE_OTHER): Payer: Medicare Other | Admitting: Internal Medicine

## 2012-03-02 ENCOUNTER — Encounter: Payer: Self-pay | Admitting: Internal Medicine

## 2012-03-02 VITALS — BP 130/68 | HR 76 | Temp 97.0°F | Resp 16 | Wt 89.0 lb

## 2012-03-02 DIAGNOSIS — K219 Gastro-esophageal reflux disease without esophagitis: Secondary | ICD-10-CM

## 2012-03-02 DIAGNOSIS — I10 Essential (primary) hypertension: Secondary | ICD-10-CM

## 2012-03-02 DIAGNOSIS — K589 Irritable bowel syndrome without diarrhea: Secondary | ICD-10-CM

## 2012-03-02 DIAGNOSIS — F419 Anxiety disorder, unspecified: Secondary | ICD-10-CM

## 2012-03-02 DIAGNOSIS — F411 Generalized anxiety disorder: Secondary | ICD-10-CM

## 2012-03-02 DIAGNOSIS — J309 Allergic rhinitis, unspecified: Secondary | ICD-10-CM

## 2012-03-02 NOTE — Assessment & Plan Note (Signed)
Continue with current OTC therapy (MOM x years; does not want to switch)

## 2012-03-02 NOTE — Progress Notes (Signed)
   Subjective:    HPI  F/u rash- resolved  F/u insomnia, HTN The patient has been doing well overall without major physical or psychological issues going on lately. C/o wt loss she has been limiting her foods - ie NAS diet...eating better now   Wt Readings from Last 3 Encounters:  03/02/12 89 lb (40.37 kg)  11/28/11 89 lb (40.37 kg)  10/31/11 91 lb (41.277 kg)   BP Readings from Last 3 Encounters:  03/02/12 130/68  11/28/11 118/70  10/31/11 120/70     Review of Systems  Constitutional: Negative for activity change, appetite change and unexpected weight change.  HENT: Negative for mouth sores and sinus pressure.   Eyes: Negative for visual disturbance.  Respiratory: Negative for chest tightness.   Genitourinary: Negative for difficulty urinating.  Musculoskeletal: Negative for gait problem.  Skin: Negative for pallor.  Neurological: Negative for tremors, numbness and headaches.  Psychiatric/Behavioral: Positive for sleep disturbance. Negative for suicidal ideas, behavioral problems, confusion and dysphoric mood. The patient is nervous/anxious.        Objective:   Physical Exam  Constitutional: She appears well-developed and well-nourished. No distress.  HENT:  Head: Normocephalic.  Right Ear: External ear normal.  Left Ear: External ear normal.  Nose: Nose normal.  Mouth/Throat: Oropharynx is clear and moist.  Eyes: Conjunctivae are normal. Pupils are equal, round, and reactive to light. Right eye exhibits no discharge. Left eye exhibits no discharge.  Neck: Normal range of motion. Neck supple. No JVD present. No tracheal deviation present. No thyromegaly present.  Cardiovascular: Normal rate, regular rhythm and normal heart sounds.   Pulmonary/Chest: No stridor. No respiratory distress. She has no wheezes.  Abdominal: Soft. Bowel sounds are normal. She exhibits no distension and no mass. There is no tenderness. There is no rebound and no guarding.  Musculoskeletal:  She exhibits no edema and no tenderness.  Lymphadenopathy:    She has no cervical adenopathy.  Neurological: She displays normal reflexes. No cranial nerve deficit. She exhibits normal muscle tone. Coordination normal.  Skin: No rash noted. No erythema.  Psychiatric: She has a normal mood and affect. Her behavior is normal. Judgment and thought content normal.  A little anxious     Lab Results  Component Value Date   WBC 5.4 12/28/2010   HGB 14.5 12/28/2010   HCT 43.3 12/28/2010   PLT 257 12/28/2010   GLUCOSE 123* 07/25/2011   CHOL 279* 12/20/2009   TRIG 169.0* 12/20/2009   HDL 65.20 12/20/2009   LDLDIRECT 177.6 12/20/2009   ALT 12 12/20/2009   AST 19 12/20/2009   NA 139 07/25/2011   K 3.7 07/25/2011   CL 101 07/25/2011   CREATININE 1.0 07/25/2011   BUN 13 07/25/2011   CO2 31 07/25/2011   TSH 0.40 07/25/2011         Assessment & Plan:

## 2012-03-02 NOTE — Assessment & Plan Note (Signed)
Continue with current prescription therapy as reflected on the Med list.  

## 2012-03-15 ENCOUNTER — Ambulatory Visit: Payer: Self-pay | Admitting: Internal Medicine

## 2012-03-15 ENCOUNTER — Telehealth: Payer: Self-pay | Admitting: Internal Medicine

## 2012-03-15 NOTE — Telephone Encounter (Signed)
Patient reports that she could not come to the appointment this afternoon because she had taken a laxative and was worried about having an accident. Denies having chest pain at this time. Offered to reschedule appointment for tomorrow, 03/16/12 but patient refused. Reports having mild chest discomfort at times during the night. Again offered to schedule appointment, but patient reports she will wait until her normal visit is scheduled. Instructed her to call back if she develops chest pain or shortness of breath.

## 2012-03-15 NOTE — Telephone Encounter (Signed)
Patient Information:  Caller Name: Kurstyn  Phone: [phone]  Patient: Lindsey Dickson, Lindsey Dickson  Gender: Female  DOB: Jan 03, 1921  Age: 79 Years  PCP: Plotnikov, Alex (Adults only)  Office Follow Up:  Does the office need to follow up with this patient?: No  Instructions For The Office: N/A   Symptoms  Reason For Call & Symptoms: Pt states that her BP has been low and she stopped her BP medication, Norvasc last week on her own; has not been evaluated by physician; BP 116/75 today;  Pulse 80; feeling okay today; no symptoms; feels lightheaded at times; but not today;  BP 112/69;  unsure what her pressure ususally runs; lowest BP was 111/64 on 03/13/12; denies irregular heartbeat;  at times she has a small pain in chest; no chest pain now; last chest pain was  last week;  Reviewed Health History In EMR: Yes  Reviewed Medications In EMR: Yes  Reviewed Allergies In EMR: Yes  Reviewed Surgeries / Procedures: Yes  Date of Onset of Symptoms: 03/10/2012  Guideline(s) Used:  Chest Pain  Disposition Per Guideline:   See Today in Office  Reason For Disposition Reached:   Intermittent chest pains persist > 3 days  Advice Given:  Call Back If:  Severe chest pain  Constant chest pain lasting longer than 5 minutes  Difficulty breathing  You become worse.  Appointment Scheduled:  03/15/2012 13:45:00 Appointment Scheduled Provider:  Rene Paci (Adults only)

## 2012-03-31 ENCOUNTER — Ambulatory Visit: Payer: Self-pay | Admitting: Internal Medicine

## 2012-03-31 ENCOUNTER — Telehealth: Payer: Self-pay | Admitting: Internal Medicine

## 2012-03-31 NOTE — Telephone Encounter (Signed)
Patient Information:  Caller Name: Bitania  Phone: [phone]  Patient: Lindsey Dickson, Heideman  Gender: Female  DOB: [birth year]  Age: 79 Years  PCP: Plotnikov, Alex (Adults only)  Office Follow Up:  Does the office need to follow up with this patient?: No  Instructions For The Office: N/A  RN Note:  Patient states she developed abdominal bloating, onset 03/29/12. States sx have gradually increased. States abdomen feels " as big as a drum." Patient states she had a small, hard stool 03/30/12. Denies nausea or vomiting. Patient denies abdominal pain but states abdomen feels "uncomfortable." Urinating normally for patient. Care advice given per guidelines. Call back parameters reviewed. Patient verbalizes understanding.  Symptoms  Reason For Call & Symptoms: Abdominal bloating  Reviewed Health History In EMR: Yes  Reviewed Medications In EMR: Yes  Reviewed Allergies In EMR: Yes  Reviewed Surgeries / Procedures: Yes  Date of Onset of Symptoms: 03/29/2012  Guideline(s) Used:  Abdominal Pain - Female  Disposition Per Guideline:   See Today in Office  Reason For Disposition Reached:   Age > 60 years  Advice Given:  Fluids:  Sip clear fluids only (e.g., water, flat soft drinks or 1/2 strength fruit juice) until the pain has been gone for over 2 hours. Then slowly return to a regular diet.  Call Back If:  Abdominal pain is constant and present for more than 2 hours  You become worse.  Appointment Scheduled:  03/31/2012 15:00:00 Appointment Scheduled Provider:  Sonda Primes (Adults only)

## 2012-03-31 NOTE — Telephone Encounter (Signed)
Patient Information:  Caller Name: Sharonne  Phone: [phone]  Patient: Lindsey Dickson, Lindsey Dickson  Gender: Female  DOB: 01-21-1920  Age: 79 Years  PCP: Plotnikov, Alex (Adults only)  Office Follow Up:  Does the office need to follow up with this patient?: No  Instructions For The Office: N/A   Symptoms  Reason For Call & Symptoms: Patient already triaged today, see note.  Had appt. scheduled for 3/12 and then canceled this.  Calling back to reschedule.  Requesting appt time not available to make out of order so transfered to office.  Reviewed Health History In EMR: N/A  Reviewed Medications In EMR: N/A  Reviewed Allergies In EMR: N/A  Reviewed Surgeries / Procedures: N/A  Date of Onset of Symptoms: 03/31/2012  Guideline(s) Used:  No Protocol Available - Information Only  Disposition Per Guideline:   Home Care  Reason For Disposition Reached:   Information only question and nurse able to answer  Advice Given:  N/A

## 2012-04-01 ENCOUNTER — Ambulatory Visit: Payer: Self-pay | Admitting: Internal Medicine

## 2012-04-08 ENCOUNTER — Ambulatory Visit (INDEPENDENT_AMBULATORY_CARE_PROVIDER_SITE_OTHER): Payer: Medicare Other | Admitting: Internal Medicine

## 2012-04-08 ENCOUNTER — Encounter: Payer: Self-pay | Admitting: Internal Medicine

## 2012-04-08 ENCOUNTER — Other Ambulatory Visit (INDEPENDENT_AMBULATORY_CARE_PROVIDER_SITE_OTHER): Payer: Medicare Other

## 2012-04-08 VITALS — BP 112/72 | HR 78 | Temp 97.1°F | Wt 90.0 lb

## 2012-04-08 DIAGNOSIS — R634 Abnormal weight loss: Secondary | ICD-10-CM

## 2012-04-08 DIAGNOSIS — K219 Gastro-esophageal reflux disease without esophagitis: Secondary | ICD-10-CM

## 2012-04-08 LAB — URINALYSIS
Hgb urine dipstick: NEGATIVE
Ketones, ur: NEGATIVE
Leukocytes, UA: NEGATIVE
Urine Glucose: NEGATIVE
Urobilinogen, UA: 0.2 (ref 0.0–1.0)

## 2012-04-08 NOTE — Assessment & Plan Note (Signed)
Wt Readings from Last 3 Encounters:  04/08/12 90 lb (40.824 kg)  03/02/12 89 lb (40.37 kg)  11/28/11 89 lb (40.37 kg)

## 2012-04-08 NOTE — Assessment & Plan Note (Signed)
Continue with current prescription therapy as reflected on the Med list.  

## 2012-04-08 NOTE — Progress Notes (Signed)
   Subjective:    HPI  C/o hoarsness  F/u insomnia, HTN, constipation C/o some suprapubic discomfort x few days The patient has been doing well overall without major physical or psychological issues going on lately. C/o wt loss she has been limiting her foods - ie NAS diet...eating better now   Wt Readings from Last 3 Encounters:  04/08/12 90 lb (40.824 kg)  03/02/12 89 lb (40.37 kg)  11/28/11 89 lb (40.37 kg)   BP Readings from Last 3 Encounters:  04/08/12 112/72  03/02/12 130/68  11/28/11 118/70     Review of Systems  Constitutional: Negative for activity change, appetite change and unexpected weight change.  HENT: Negative for mouth sores and sinus pressure.   Eyes: Negative for visual disturbance.  Respiratory: Negative for chest tightness.   Gastrointestinal: Positive for abdominal distention (mild  x  2-3 d).  Genitourinary: Negative for difficulty urinating.  Musculoskeletal: Negative for gait problem.  Skin: Negative for pallor.  Neurological: Negative for tremors.  Psychiatric/Behavioral: Positive for sleep disturbance. Negative for suicidal ideas, behavioral problems, confusion and dysphoric mood. The patient is nervous/anxious.        Objective:   Physical Exam  Constitutional: She appears well-developed and well-nourished. No distress.  HENT:  Head: Normocephalic.  Right Ear: External ear normal.  Left Ear: External ear normal.  Nose: Nose normal.  Mouth/Throat: Oropharynx is clear and moist.  Eyes: Conjunctivae are normal. Pupils are equal, round, and reactive to light. Right eye exhibits no discharge. Left eye exhibits no discharge.  Neck: Normal range of motion. Neck supple. No JVD present. No tracheal deviation present. No thyromegaly present.  Cardiovascular: Normal rate, regular rhythm and normal heart sounds.   Pulmonary/Chest: No stridor. No respiratory distress. She has no wheezes.  Abdominal: Soft. Bowel sounds are normal. She exhibits no  distension and no mass. There is no tenderness. There is no rebound and no guarding.  Musculoskeletal: She exhibits no edema and no tenderness.  Lymphadenopathy:    She has no cervical adenopathy.  Neurological: She displays normal reflexes. No cranial nerve deficit. She exhibits normal muscle tone. Coordination normal.  Skin: No rash noted. No erythema.  Psychiatric: She has a normal mood and affect. Her behavior is normal. Judgment and thought content normal.  A little anxious     Lab Results  Component Value Date   WBC 5.4 12/28/2010   HGB 14.5 12/28/2010   HCT 43.3 12/28/2010   PLT 257 12/28/2010   GLUCOSE 123* 07/25/2011   CHOL 279* 12/20/2009   TRIG 169.0* 12/20/2009   HDL 65.20 12/20/2009   LDLDIRECT 177.6 12/20/2009   ALT 12 12/20/2009   AST 19 12/20/2009   NA 139 07/25/2011   K 3.7 07/25/2011   CL 101 07/25/2011   CREATININE 1.0 07/25/2011   BUN 13 07/25/2011   CO2 31 07/25/2011   TSH 0.40 07/25/2011         Assessment & Plan:

## 2012-04-08 NOTE — Assessment & Plan Note (Signed)
UA

## 2012-04-13 ENCOUNTER — Telehealth: Payer: Self-pay | Admitting: Internal Medicine

## 2012-04-13 NOTE — Telephone Encounter (Signed)
Patient has questions/concerns about her medication, she would like these answered before she continues to take the medication

## 2012-04-14 ENCOUNTER — Ambulatory Visit (INDEPENDENT_AMBULATORY_CARE_PROVIDER_SITE_OTHER): Payer: Medicare Other | Admitting: Internal Medicine

## 2012-04-14 ENCOUNTER — Telehealth: Payer: Self-pay | Admitting: Internal Medicine

## 2012-04-14 ENCOUNTER — Encounter: Payer: Self-pay | Admitting: Internal Medicine

## 2012-04-14 VITALS — BP 160/80 | HR 80 | Temp 97.8°F | Resp 16 | Wt 88.0 lb

## 2012-04-14 DIAGNOSIS — R443 Hallucinations, unspecified: Secondary | ICD-10-CM

## 2012-04-14 DIAGNOSIS — R634 Abnormal weight loss: Secondary | ICD-10-CM

## 2012-04-14 DIAGNOSIS — K219 Gastro-esophageal reflux disease without esophagitis: Secondary | ICD-10-CM

## 2012-04-14 MED ORDER — MIRTAZAPINE 15 MG PO TABS: 15.0000 mg | ORAL_TABLET | Freq: Every day | ORAL | Status: DC

## 2012-04-14 MED ORDER — FAMOTIDINE 20 MG PO TABS: 20.0000 mg | ORAL_TABLET | Freq: Two times a day (BID) | ORAL | Status: DC | PRN

## 2012-04-14 NOTE — Assessment & Plan Note (Signed)
Diet discussed 

## 2012-04-14 NOTE — Progress Notes (Signed)
   Subjective:    HPI She is here today with her grandson. C/o seeing a bird in the room last night (after taking Ambien before she went to bed). The bird was very vivid and she was trying to shoo her away from her bed.  F/u insomnia - worse, HTN, constipation, GERD. C/o some suprapubic discomfort x few days F/u on wt loss she has been limiting her foods - ie NAS diet...eating better now   Wt Readings from Last 3 Encounters:  04/14/12 88 lb (39.917 kg)  04/08/12 90 lb (40.824 kg)  03/02/12 89 lb (40.37 kg)   BP Readings from Last 3 Encounters:  04/14/12 160/80  04/08/12 112/72  03/02/12 130/68     Review of Systems  Constitutional: Negative for activity change, appetite change and unexpected weight change.  HENT: Negative for mouth sores and sinus pressure.   Eyes: Negative for visual disturbance.  Respiratory: Negative for chest tightness.   Gastrointestinal: Positive for abdominal distention (mild  x  2-3 d).  Genitourinary: Negative for difficulty urinating.  Musculoskeletal: Negative for gait problem.  Skin: Negative for pallor.  Neurological: Negative for tremors.  Psychiatric/Behavioral: Positive for sleep disturbance. Negative for suicidal ideas, behavioral problems, confusion and dysphoric mood. The patient is nervous/anxious.        Objective:   Physical Exam  Constitutional: She appears well-developed and well-nourished. No distress.  HENT:  Head: Normocephalic.  Right Ear: External ear normal.  Left Ear: External ear normal.  Nose: Nose normal.  Mouth/Throat: Oropharynx is clear and moist.  Eyes: Conjunctivae are normal. Pupils are equal, round, and reactive to light. Right eye exhibits no discharge. Left eye exhibits no discharge.  Neck: Normal range of motion. Neck supple. No JVD present. No tracheal deviation present. No thyromegaly present.  Cardiovascular: Normal rate, regular rhythm and normal heart sounds.   Pulmonary/Chest: No stridor. No  respiratory distress. She has no wheezes.  Abdominal: Soft. Bowel sounds are normal. She exhibits no distension and no mass. There is no tenderness. There is no rebound and no guarding.  Musculoskeletal: She exhibits no edema and no tenderness.  Lymphadenopathy:    She has no cervical adenopathy.  Neurological: She displays normal reflexes. No cranial nerve deficit. She exhibits normal muscle tone. Coordination normal.  Skin: No rash noted. No erythema.  Psychiatric: She has a normal mood and affect. Her behavior is normal. Judgment and thought content normal.  A little anxious  a/o/c   Lab Results  Component Value Date   WBC 5.4 12/28/2010   HGB 14.5 12/28/2010   HCT 43.3 12/28/2010   PLT 257 12/28/2010   GLUCOSE 123* 07/25/2011   CHOL 279* 12/20/2009   TRIG 169.0* 12/20/2009   HDL 65.20 12/20/2009   LDLDIRECT 177.6 12/20/2009   ALT 12 12/20/2009   AST 19 12/20/2009   NA 139 07/25/2011   K 3.7 07/25/2011   CL 101 07/25/2011   CREATININE 1.0 07/25/2011   BUN 13 07/25/2011   CO2 31 07/25/2011   TSH 0.40 07/25/2011         Assessment & Plan:

## 2012-04-14 NOTE — Assessment & Plan Note (Signed)
Continue with current prescription therapy as reflected on the Med list.  

## 2012-04-14 NOTE — Telephone Encounter (Signed)
Pt had OV today. Closing phone note.

## 2012-04-14 NOTE — Telephone Encounter (Signed)
Patient Information:  Caller Name: Jennet  Phone: [phone]  Patient: Lindsey Dickson  Gender: Female  DOB: 1920/08/16  Age: 79 Years  PCP: Plotnikov, Alex (Adults only)  Office Follow Up:  Does the office need to follow up with this patient?: No  Instructions For The Office: N/A   Symptoms  Reason For Call & Symptoms: Afraid to take Ambien since last time she took it she felt dizzy, felt nauseous and had some hallucinations- took 1/2 dose on 04/12/12 (each pill is 10 mgs). Recently started on Medication for Reflux and still having stomachaches after eating. She is feeling weak and shaky today since trouble sleeping last few nights. Voiding QS. Pulse = 70 BP= 135/80.  Reviewed Health History In EMR: Yes  Reviewed Medications In EMR: Yes  Reviewed Allergies In EMR: Yes  Reviewed Surgeries / Procedures: Yes  Date of Onset of Symptoms: 04/12/2012  Treatments Tried: Did not take Ambien last night  Treatments Tried Worked: No  Guideline(s) Used:  Dizziness  Disposition Per Guideline:   See Today in Office  Reason For Disposition Reached:   Patient wants to be seen  Advice Given:  Temporary Dizziness  is usually a harmless symptom. It can be caused by not drinking enough water during sports or hot weather. It can also be caused by skipping a meal, too much sun exposure, standing up suddenly, standing too long in one place or even a viral illness.  Some Causes of Temporary Dizziness:  Poor Fluid Intake - Not drinking enough fluids and being a little dehydrated is a common cause of temporary dizziness. This is always worse during hot weather.  Standing Up Suddenly - Standing up suddenly (especially getting out of bed) or prolonged standing in one place are common causes of temporary dizziness. Not drinking enough fluids always makes it worse. Certain medications can cause or increase this type of dizziness (e.g., blood pressure medications).  Drink Fluids:  Drink several glasses of fruit  juice, other clear fluids, or water. This will improve hydration and blood glucose. If you have a fever or have had heat exposure, make sure the fluids are cold.  Stand Up Slowly:  In the mornings, sit up for a few minutes before you stand up. That will help your blood flow make the adjustment.  If you have to stand up for long periods of time, contract and relax your leg muscles to help pump the blood back to the heart.  Sit down or lie down if you feel dizzy.  Call Back If:  Still feel dizzy after 2 hours of rest and fluids  Passes out (faints)  You become worse.  Patient Will Follow Care Advice:  YES  Appointment Scheduled:  04/14/2012 14:30:00 Appointment Scheduled Provider:  Sonda Primes (Adults only)

## 2012-04-14 NOTE — Assessment & Plan Note (Signed)
Dc ambien

## 2012-04-23 ENCOUNTER — Telehealth: Payer: Self-pay

## 2012-04-23 NOTE — Telephone Encounter (Signed)
Try to take 1/2 tab at night if she has been taking 1 at night. Lower dose is more sedating... Thx

## 2012-04-23 NOTE — Telephone Encounter (Signed)
Pt called stating that Remeron is not helping with sleeplessness. Pt is requesting to change back to Ambien or for MD to make dosage adjustment, please advise.

## 2012-04-23 NOTE — Telephone Encounter (Signed)
Pt advised and agreed to take 1/2 tablet and call back as needed

## 2012-04-29 ENCOUNTER — Telehealth: Payer: Self-pay | Admitting: Internal Medicine

## 2012-04-29 ENCOUNTER — Ambulatory Visit (INDEPENDENT_AMBULATORY_CARE_PROVIDER_SITE_OTHER): Payer: Medicare Other | Admitting: Internal Medicine

## 2012-04-29 ENCOUNTER — Encounter: Payer: Self-pay | Admitting: Internal Medicine

## 2012-04-29 VITALS — BP 148/70 | HR 76 | Temp 97.6°F | Resp 16 | Wt 90.0 lb

## 2012-04-29 DIAGNOSIS — F329 Major depressive disorder, single episode, unspecified: Secondary | ICD-10-CM

## 2012-04-29 DIAGNOSIS — I10 Essential (primary) hypertension: Secondary | ICD-10-CM

## 2012-04-29 DIAGNOSIS — R21 Rash and other nonspecific skin eruption: Secondary | ICD-10-CM

## 2012-04-29 DIAGNOSIS — R112 Nausea with vomiting, unspecified: Secondary | ICD-10-CM

## 2012-04-29 NOTE — Patient Instructions (Addendum)
Please call if more rash

## 2012-04-29 NOTE — Assessment & Plan Note (Signed)
4/14 L dist forearm: bug bite vs H zoster vs allergic reaction... Triamc cream to cont - it is getting better Call if worse or if new rash

## 2012-04-29 NOTE — Telephone Encounter (Signed)
[facility] Triage Call Report Triage Record Num: [id number] Operator: Valene Bors Patient Name: Lindsey Dickson Call Date & Time: 04/28/2012 6:25:49PM Patient Phone: [phone] PCP: Sonda Primes Patient Gender: Female PCP Fax : [phone] Patient DOB: [birth year] [facility] Name: [facility] Reason for Call: Caller: Dorianne/Patient; PCP: Sonda Primes (Adults only); CB#: [phone]; Call regarding Itchy dime sized Rash- on L arm -started on arm on 04/26/12. She put Triamcinolone cream and no longer itchy but rash looks bigger today. Recently started on new medications; Mirtazipine, Pepcid, and Melatonin. Stomach feels bloated -had bm 3 x today formed stool. She vomited 2 times after eating and taking medications. She is voiding QS. Triage and Care advice per Rash and Nausea or Vomiting Protocols and MD on call contacted. He advised appointment- scheduled for 0815 04/29/12 to discuss meds and have rash checked. Protocol(s) Used: Rash Recommended Outcome per Protocol: Call Provider within 4 Hours Reason for Outcome: New onset of rash after beginning new prescribed, nonprescribed, or alternative/complementary medication Care Advice: Cool/tepid showers or baths may help relieve itching. If cool water alone does not relieve itching, try adding 1/2 to 1 cup baking soda or colloidal oatmeal (Aveeno) to bath water. ~Avoid use of perfumed or strong soaps, detergents or any product that is irritating to the skin. Only use mild, unscented soap (Aveeno, Neutrogena) for bathing to decrease irritation. ~ Rash from a drug reaction may not appear for a week or more after a drug was started. ~ If the offending drug is stopped (per provider instructions) the rash can be expected to continue for about a week. ~ Speak with provider before next dose of medication is due. ~ Should not be alone for the next 24 hours in case symptoms worsen. ~ IMMEDIATE ACTION If immediately available, consider taking an  appropriate dose of a nonprescription antihistamine (e.g., Benadryl) orally as directed by the label or a pharmacist. These medications may cause drowsiness and should be taken with caution by adults 75 years or older.

## 2012-04-29 NOTE — Progress Notes (Signed)
Patient ID: Lindsey Dickson, female   DOB: 03-13-1920, 79 y.o.   MRN: [id number]   Subjective:    Urticaria This is a new problem. The current episode started in the past 7 days. The problem has been gradually improving since onset. The affected locations include the left arm (x1 on L forearm). The rash is characterized by blistering. Associated symptoms include vomiting. Past treatments include topical steroids. The treatment provided mild relief.  Emesis  This is a new problem. The current episode started yesterday. The problem occurs less than 2 times per day. The problem has been resolved. Pertinent negatives include no abdominal pain. She has tried nothing for the symptoms. Improvement on treatment: resolved.    F/u an episode of seeing a bird in the room one night (after taking Ambien before she went to bed). The bird was very vivid and she was trying to shoo her away from her bed. No relapse. F/u insomnia - better, HTN, constipation, GERD. C/o some suprapubic discomfort x few days F/u on wt loss she has been limiting her foods - ie NAS diet... eating better now.   Wt Readings from Last 3 Encounters:  04/29/12 90 lb (40.824 kg)  04/14/12 88 lb (39.917 kg)  04/08/12 90 lb (40.824 kg)   BP Readings from Last 3 Encounters:  04/29/12 148/70  04/14/12 160/80  04/08/12 112/72     Review of Systems  Constitutional: Negative for activity change, appetite change and unexpected weight change.  HENT: Negative for mouth sores and sinus pressure.   Eyes: Negative for visual disturbance.  Respiratory: Negative for chest tightness.   Gastrointestinal: Positive for vomiting and abdominal distention (mild  x  2-3 d). Negative for abdominal pain.  Genitourinary: Negative for difficulty urinating.  Musculoskeletal: Negative for gait problem.  Skin: Negative for pallor.  Neurological: Negative for tremors.  Psychiatric/Behavioral: Positive for sleep disturbance. Negative for suicidal ideas,  behavioral problems, confusion and dysphoric mood. The patient is nervous/anxious.        Objective:   Physical Exam  Constitutional: She appears well-developed and well-nourished. No distress.  HENT:  Head: Normocephalic.  Right Ear: External ear normal.  Left Ear: External ear normal.  Nose: Nose normal.  Mouth/Throat: Oropharynx is clear and moist.  Eyes: Conjunctivae are normal. Pupils are equal, round, and reactive to light. Right eye exhibits no discharge. Left eye exhibits no discharge.  Neck: Normal range of motion. Neck supple. No JVD present. No tracheal deviation present. No thyromegaly present.  Cardiovascular: Normal rate, regular rhythm and normal heart sounds.   Pulmonary/Chest: No stridor. No respiratory distress. She has no wheezes.  Abdominal: Soft. Bowel sounds are normal. She exhibits no distension and no mass. There is no tenderness. There is no rebound and no guarding.  Musculoskeletal: She exhibits no edema and no tenderness.  Lymphadenopathy:    She has no cervical adenopathy.  Neurological: She displays normal reflexes. No cranial nerve deficit. She exhibits normal muscle tone. Coordination normal.  Skin: No rash noted. No erythema.  Psychiatric: She has a normal mood and affect. Her behavior is normal. Judgment and thought content normal.  A little anxious  a/o/c She has one eryth papule with faint blisters on L dist forerm 20x14 mm, ulnar aspect   Lab Results  Component Value Date   WBC 5.4 12/28/2010   HGB 14.5 12/28/2010   HCT 43.3 12/28/2010   PLT 257 12/28/2010   GLUCOSE 123* 07/25/2011   CHOL 279* 12/20/2009   TRIG 169.0*  12/20/2009   HDL 65.20 12/20/2009   LDLDIRECT 177.6 12/20/2009   ALT 12 12/20/2009   AST 19 12/20/2009   NA 139 07/25/2011   K 3.7 07/25/2011   CL 101 07/25/2011   CREATININE 1.0 07/25/2011   BUN 13 07/25/2011   CO2 31 07/25/2011   TSH 0.40 07/25/2011         Assessment & Plan:

## 2012-04-29 NOTE — Assessment & Plan Note (Signed)
Remeron 15 mg 1/4 or 1/2 a day at 5-6 pm

## 2012-04-29 NOTE — Assessment & Plan Note (Signed)
Continue with current prescription therapy as reflected on the Med list.  

## 2012-04-29 NOTE — Assessment & Plan Note (Signed)
4/14 ?etiol - resolved Will watch

## 2012-05-10 ENCOUNTER — Ambulatory Visit: Payer: Self-pay | Admitting: Internal Medicine

## 2012-05-10 ENCOUNTER — Telehealth: Payer: Self-pay | Admitting: Internal Medicine

## 2012-05-10 NOTE — Telephone Encounter (Signed)
Patient Information:  Caller Name: Adalei  Phone: [phone]  Patient: Lindsey Dickson, Lindsey Dickson  Gender: Female  DOB: 1920/03/20  Age: 79 Years  PCP: Plotnikov, Alex (Adults only)  Office Follow Up:  Does the office need to follow up with this patient?: No  Instructions For The Office: N/A   Symptoms  Reason For Call & Symptoms: Patient states "I feel like my Bronchitis is acting up". Onset Saturday. She is having a "sick feeling". Unsure of fever but felt hot the last three nights. Asked her to take temperature- 97.9. . +coughing productive- yellow, +runny nose, hoarseness noted.  Reviewed Health History In EMR: Yes  Reviewed Medications In EMR: Yes  Reviewed Allergies In EMR: Yes  Reviewed Surgeries / Procedures: Yes  Date of Onset of Symptoms: 05/08/2012  Guideline(s) Used:  Cough  Disposition Per Guideline:   See Today or Tomorrow in Office  Reason For Disposition Reached:   Patient wants to be seen  Advice Given:  Cough Medicines:  Home Remedy - Hard Candy: Hard candy works just as well as medicine-flavored OTC cough drops. Diabetics should use sugar-free candy.  Home Remedy - Honey: This old home remedy has been shown to help decrease coughing at night. The adult dosage is 2 teaspoons (10 ml) at bedtime. Honey should not be given to infants under one year of age.  Prevent Dehydration:  Drink adequate liquids.  This will help soothe an irritated or dry throat and loosen up the phlegm.  Coughing Spasms:  Drink warm fluids. Inhale warm mist (Reason: both relax the airway and loosen up the phlegm).  Suck on cough drops or hard candy to coat the irritated throat.  Call Back If:  Difficulty breathing  Fever lasts > 3 days  You become worse.  Patient Will Follow Care Advice:  YES  Appointment Scheduled:  05/10/2012 16:00:00 Appointment Scheduled Provider:  Sonda Primes (Adults only)

## 2012-05-14 ENCOUNTER — Ambulatory Visit: Payer: Medicare Other | Admitting: Internal Medicine

## 2012-06-09 ENCOUNTER — Ambulatory Visit: Payer: Medicare Other | Admitting: Internal Medicine

## 2012-06-09 ENCOUNTER — Other Ambulatory Visit: Payer: Self-pay | Admitting: Internal Medicine

## 2012-06-09 DIAGNOSIS — Z0289 Encounter for other administrative examinations: Secondary | ICD-10-CM

## 2012-06-10 ENCOUNTER — Other Ambulatory Visit: Payer: Self-pay | Admitting: Internal Medicine

## 2012-06-30 ENCOUNTER — Encounter: Payer: Self-pay | Admitting: Internal Medicine

## 2012-06-30 ENCOUNTER — Ambulatory Visit (INDEPENDENT_AMBULATORY_CARE_PROVIDER_SITE_OTHER): Payer: Medicare Other | Admitting: Internal Medicine

## 2012-06-30 VITALS — BP 100/62 | HR 68 | Temp 97.9°F | Resp 16 | Wt 94.0 lb

## 2012-06-30 DIAGNOSIS — F329 Major depressive disorder, single episode, unspecified: Secondary | ICD-10-CM

## 2012-06-30 DIAGNOSIS — R634 Abnormal weight loss: Secondary | ICD-10-CM

## 2012-06-30 DIAGNOSIS — M545 Low back pain, unspecified: Secondary | ICD-10-CM

## 2012-06-30 DIAGNOSIS — G4700 Insomnia, unspecified: Secondary | ICD-10-CM

## 2012-06-30 DIAGNOSIS — K219 Gastro-esophageal reflux disease without esophagitis: Secondary | ICD-10-CM

## 2012-06-30 DIAGNOSIS — I10 Essential (primary) hypertension: Secondary | ICD-10-CM

## 2012-06-30 DIAGNOSIS — F3289 Other specified depressive episodes: Secondary | ICD-10-CM

## 2012-06-30 MED ORDER — AMLODIPINE BESYLATE 5 MG PO TABS: 5.0000 mg | ORAL_TABLET | Freq: Every day | ORAL | Status: DC

## 2012-06-30 NOTE — Assessment & Plan Note (Signed)
Use Remeron 1/2 tab

## 2012-06-30 NOTE — Assessment & Plan Note (Signed)
Continue with current prescription therapy as reflected on the Med list.  

## 2012-06-30 NOTE — Assessment & Plan Note (Signed)
Doing well 

## 2012-06-30 NOTE — Progress Notes (Signed)
   Subjective:    HPI She is here today for a f/u - better  C/o insomnia, no hallucinations  F/u HTN, constipation, GERD. She is eating better now   Wt Readings from Last 3 Encounters:  06/30/12 94 lb (42.638 kg)  04/29/12 90 lb (40.824 kg)  04/14/12 88 lb (39.917 kg)   BP Readings from Last 3 Encounters:  06/30/12 100/62  04/29/12 148/70  04/14/12 160/80     Review of Systems  Constitutional: Negative for activity change, appetite change and unexpected weight change.  HENT: Negative for mouth sores and sinus pressure.   Eyes: Negative for visual disturbance.  Respiratory: Negative for chest tightness.   Gastrointestinal: Negative for abdominal distention.  Genitourinary: Negative for difficulty urinating.  Musculoskeletal: Negative for gait problem.  Skin: Negative for pallor.  Neurological: Negative for tremors.  Psychiatric/Behavioral: Positive for sleep disturbance. Negative for suicidal ideas, behavioral problems, confusion and dysphoric mood. The patient is not nervous/anxious.        Objective:   Physical Exam  Constitutional: She appears well-developed and well-nourished. No distress.  HENT:  Head: Normocephalic.  Right Ear: External ear normal.  Left Ear: External ear normal.  Nose: Nose normal.  Mouth/Throat: Oropharynx is clear and moist.  Eyes: Conjunctivae are normal. Pupils are equal, round, and reactive to light. Right eye exhibits no discharge. Left eye exhibits no discharge.  Neck: Normal range of motion. Neck supple. No JVD present. No tracheal deviation present. No thyromegaly present.  Cardiovascular: Normal rate, regular rhythm and normal heart sounds.   Pulmonary/Chest: No stridor. No respiratory distress. She has no wheezes.  Abdominal: Soft. Bowel sounds are normal. She exhibits no distension and no mass. There is no tenderness. There is no rebound and no guarding.  Musculoskeletal: She exhibits no edema and no tenderness.  Lymphadenopathy:     She has no cervical adenopathy.  Neurological: She displays normal reflexes. No cranial nerve deficit. She exhibits normal muscle tone. Coordination normal.  Skin: No rash noted. No erythema.  Psychiatric: She has a normal mood and affect. Her behavior is normal. Judgment and thought content normal.  better  a/o/c   Lab Results  Component Value Date   WBC 5.4 12/28/2010   HGB 14.5 12/28/2010   HCT 43.3 12/28/2010   PLT 257 12/28/2010   GLUCOSE 123* 07/25/2011   CHOL 279* 12/20/2009   TRIG 169.0* 12/20/2009   HDL 65.20 12/20/2009   LDLDIRECT 177.6 12/20/2009   ALT 12 12/20/2009   AST 19 12/20/2009   NA 139 07/25/2011   K 3.7 07/25/2011   CL 101 07/25/2011   CREATININE 1.0 07/25/2011   BUN 13 07/25/2011   CO2 31 07/25/2011   TSH 0.40 07/25/2011         Assessment & Plan:

## 2012-06-30 NOTE — Patient Instructions (Signed)
Try to take Mirtazapine 1/2 tablet

## 2012-06-30 NOTE — Assessment & Plan Note (Signed)
Wt Readings from Last 3 Encounters:  06/30/12 94 lb (42.638 kg)  04/29/12 90 lb (40.824 kg)  04/14/12 88 lb (39.917 kg)

## 2012-06-30 NOTE — Assessment & Plan Note (Signed)
Better Continue with current prescription therapy as reflected on the Med list.  

## 2012-07-01 ENCOUNTER — Other Ambulatory Visit: Payer: Self-pay | Admitting: Internal Medicine

## 2012-07-02 ENCOUNTER — Other Ambulatory Visit: Payer: Self-pay

## 2012-07-02 NOTE — Telephone Encounter (Signed)
Patient called requesting refill on BP medication, only has 1 day left. I advised RX was filled 06/30/12 at appt to [facility]. Pt will pick up this time but wants Estell Harpin on file for future fills.

## 2012-08-18 ENCOUNTER — Encounter: Payer: Self-pay | Admitting: Cardiovascular Disease

## 2012-08-23 ENCOUNTER — Ambulatory Visit (INDEPENDENT_AMBULATORY_CARE_PROVIDER_SITE_OTHER): Payer: Medicare Other | Admitting: Internal Medicine

## 2012-08-23 ENCOUNTER — Encounter: Payer: Self-pay | Admitting: Internal Medicine

## 2012-08-23 VITALS — BP 118/78 | HR 72 | Temp 98.0°F | Resp 16 | Wt 93.0 lb

## 2012-08-23 DIAGNOSIS — G4700 Insomnia, unspecified: Secondary | ICD-10-CM

## 2012-08-23 DIAGNOSIS — F419 Anxiety disorder, unspecified: Secondary | ICD-10-CM

## 2012-08-23 DIAGNOSIS — R634 Abnormal weight loss: Secondary | ICD-10-CM

## 2012-08-23 DIAGNOSIS — R142 Eructation: Secondary | ICD-10-CM

## 2012-08-23 DIAGNOSIS — R143 Flatulence: Secondary | ICD-10-CM

## 2012-08-23 DIAGNOSIS — R141 Gas pain: Secondary | ICD-10-CM

## 2012-08-23 DIAGNOSIS — F411 Generalized anxiety disorder: Secondary | ICD-10-CM

## 2012-08-23 MED ORDER — MIRTAZAPINE 15 MG PO TABS: 15.0000 mg | ORAL_TABLET | Freq: Every day | ORAL | Status: DC

## 2012-08-23 MED ORDER — FAMOTIDINE 20 MG PO TABS: 20.0000 mg | ORAL_TABLET | Freq: Two times a day (BID) | ORAL | Status: DC | PRN

## 2012-08-23 MED ORDER — AMLODIPINE BESYLATE 5 MG PO TABS: 5.0000 mg | ORAL_TABLET | Freq: Every day | ORAL | Status: DC

## 2012-08-23 MED ORDER — POTASSIUM CHLORIDE ER 8 MEQ PO TBCR: EXTENDED_RELEASE_TABLET | ORAL | Status: DC

## 2012-08-23 NOTE — Assessment & Plan Note (Signed)
Continue with current prescription therapy prn as reflected on the Med list.  

## 2012-08-23 NOTE — Progress Notes (Signed)
Patient ID: Lindsey Dickson, female   DOB: 12-26-1920, 79 y.o.   MRN: [id number]   Subjective:    HPI C/o bloating - worse F/u insomnia - better, no hallucinations  F/u HTN, constipation, GERD. She is eating better now   Wt Readings from Last 3 Encounters:  08/23/12 93 lb (42.185 kg)  06/30/12 94 lb (42.638 kg)  04/29/12 90 lb (40.824 kg)   BP Readings from Last 3 Encounters:  08/23/12 118/78  06/30/12 100/62  04/29/12 148/70     Review of Systems  Constitutional: Negative for activity change, appetite change and unexpected weight change.  HENT: Negative for mouth sores and sinus pressure.   Eyes: Negative for visual disturbance.  Respiratory: Negative for chest tightness.   Gastrointestinal: Negative for abdominal distention.  Genitourinary: Negative for difficulty urinating.  Musculoskeletal: Negative for gait problem.  Skin: Negative for pallor.  Neurological: Negative for tremors.  Psychiatric/Behavioral: Positive for sleep disturbance. Negative for suicidal ideas, behavioral problems, confusion and dysphoric mood. The patient is not nervous/anxious.        Objective:   Physical Exam  Constitutional: She appears well-developed and well-nourished. No distress.  HENT:  Head: Normocephalic.  Right Ear: External ear normal.  Left Ear: External ear normal.  Nose: Nose normal.  Mouth/Throat: Oropharynx is clear and moist.  Eyes: Conjunctivae are normal. Pupils are equal, round, and reactive to light. Right eye exhibits no discharge. Left eye exhibits no discharge.  Neck: Normal range of motion. Neck supple. No JVD present. No tracheal deviation present. No thyromegaly present.  Cardiovascular: Normal rate, regular rhythm and normal heart sounds.   Pulmonary/Chest: No stridor. No respiratory distress. She has no wheezes.  Abdominal: Soft. Bowel sounds are normal. She exhibits no distension and no mass. There is no tenderness. There is no rebound and no guarding.   Musculoskeletal: She exhibits no edema and no tenderness.  Lymphadenopathy:    She has no cervical adenopathy.  Neurological: She displays normal reflexes. No cranial nerve deficit. She exhibits normal muscle tone. Coordination normal.  Skin: No rash noted. No erythema.  Psychiatric: She has a normal mood and affect. Her behavior is normal. Judgment and thought content normal.  better  a/o/c   Lab Results  Component Value Date   WBC 5.4 12/28/2010   HGB 14.5 12/28/2010   HCT 43.3 12/28/2010   PLT 257 12/28/2010   GLUCOSE 123* 07/25/2011   CHOL 279* 12/20/2009   TRIG 169.0* 12/20/2009   HDL 65.20 12/20/2009   LDLDIRECT 177.6 12/20/2009   ALT 12 12/20/2009   AST 19 12/20/2009   NA 139 07/25/2011   K 3.7 07/25/2011   CL 101 07/25/2011   CREATININE 1.0 07/25/2011   BUN 13 07/25/2011   CO2 31 07/25/2011   TSH 0.40 07/25/2011         Assessment & Plan:

## 2012-08-23 NOTE — Patient Instructions (Signed)
Gluten free trial (no wheat products) x4-6 weeks. OK to use Gluten-free bread and pasta. Milk free trial (no milk, ice cream and yogurt) x4 weeks. OK to use almond or soy milk. 

## 2012-08-23 NOTE — Assessment & Plan Note (Signed)
Gluten free trial (no wheat products) x4-6 weeks. OK to use Gluten-free bread and pasta. Milk free trial (no milk, ice cream and yogurt) x4 weeks. OK to use almond or soy milk. 

## 2012-08-23 NOTE — Assessment & Plan Note (Signed)
Wt Readings from Last 3 Encounters:  08/23/12 93 lb (42.185 kg)  06/30/12 94 lb (42.638 kg)  04/29/12 90 lb (40.824 kg)

## 2012-08-23 NOTE — Assessment & Plan Note (Signed)
Continue with current prn prescription therapy as reflected on the Med list.  

## 2012-09-30 ENCOUNTER — Ambulatory Visit: Payer: Medicare Other | Admitting: Internal Medicine

## 2012-10-06 ENCOUNTER — Ambulatory Visit (INDEPENDENT_AMBULATORY_CARE_PROVIDER_SITE_OTHER): Payer: Medicare Other | Admitting: Internal Medicine

## 2012-10-06 ENCOUNTER — Encounter: Payer: Self-pay | Admitting: Internal Medicine

## 2012-10-06 VITALS — BP 140/82 | HR 80 | Temp 97.0°F | Resp 12 | Wt 94.0 lb

## 2012-10-06 DIAGNOSIS — R634 Abnormal weight loss: Secondary | ICD-10-CM

## 2012-10-06 DIAGNOSIS — F329 Major depressive disorder, single episode, unspecified: Secondary | ICD-10-CM

## 2012-10-06 DIAGNOSIS — K219 Gastro-esophageal reflux disease without esophagitis: Secondary | ICD-10-CM

## 2012-10-06 DIAGNOSIS — I10 Essential (primary) hypertension: Secondary | ICD-10-CM

## 2012-10-06 NOTE — Assessment & Plan Note (Signed)
Wt Readings from Last 3 Encounters:  10/06/12 94 lb (42.638 kg)  08/23/12 93 lb (42.185 kg)  06/30/12 94 lb (42.638 kg)  Better

## 2012-10-06 NOTE — Assessment & Plan Note (Signed)
Continue with current prescription therapy as reflected on the Med list.  

## 2012-10-06 NOTE — Progress Notes (Signed)
Patient ID: SARAN LAVIOLETTE, female   DOB: [birth year], 79 y.o.   MRN: [id number] Patient ID: LAYNEY GILLSON, female   DOB: 1920-05-08, 79 y.o.   MRN: [id number]   Subjective:    HPI C/o bloating - worse F/u insomnia - better, no hallucinations  F/u HTN, constipation, GERD. She is eating better now   Wt Readings from Last 3 Encounters:  10/06/12 94 lb (42.638 kg)  08/23/12 93 lb (42.185 kg)  06/30/12 94 lb (42.638 kg)   BP Readings from Last 3 Encounters:  10/06/12 140/82  08/23/12 118/78  06/30/12 100/62     Review of Systems  Constitutional: Negative for activity change, appetite change and unexpected weight change.  HENT: Negative for mouth sores and sinus pressure.   Eyes: Negative for visual disturbance.  Respiratory: Negative for chest tightness.   Gastrointestinal: Negative for abdominal distention.  Genitourinary: Negative for difficulty urinating.  Musculoskeletal: Negative for gait problem.  Skin: Negative for pallor.  Neurological: Negative for tremors.  Psychiatric/Behavioral: Positive for sleep disturbance. Negative for suicidal ideas, behavioral problems, confusion and dysphoric mood. The patient is not nervous/anxious.        Objective:   Physical Exam  Constitutional: She appears well-developed and well-nourished. No distress.  HENT:  Head: Normocephalic.  Right Ear: External ear normal.  Left Ear: External ear normal.  Nose: Nose normal.  Mouth/Throat: Oropharynx is clear and moist.  Eyes: Conjunctivae are normal. Pupils are equal, round, and reactive to light. Right eye exhibits no discharge. Left eye exhibits no discharge.  Neck: Normal range of motion. Neck supple. No JVD present. No tracheal deviation present. No thyromegaly present.  Cardiovascular: Normal rate, regular rhythm and normal heart sounds.   Pulmonary/Chest: No stridor. No respiratory distress. She has no wheezes.  Abdominal: Soft. Bowel sounds are normal. She exhibits no distension and no  mass. There is no tenderness. There is no rebound and no guarding.  Musculoskeletal: She exhibits no edema and no tenderness.  Lymphadenopathy:    She has no cervical adenopathy.  Neurological: She displays normal reflexes. No cranial nerve deficit. She exhibits normal muscle tone. Coordination normal.  Skin: No rash noted. No erythema.  Psychiatric: She has a normal mood and affect. Her behavior is normal. Judgment and thought content normal.  better  a/o/c   Lab Results  Component Value Date   WBC 5.4 12/28/2010   HGB 14.5 12/28/2010   HCT 43.3 12/28/2010   PLT 257 12/28/2010   GLUCOSE 123* 07/25/2011   CHOL 279* 12/20/2009   TRIG 169.0* 12/20/2009   HDL 65.20 12/20/2009   LDLDIRECT 177.6 12/20/2009   ALT 12 12/20/2009   AST 19 12/20/2009   NA 139 07/25/2011   K 3.7 07/25/2011   CL 101 07/25/2011   CREATININE 1.0 07/25/2011   BUN 13 07/25/2011   CO2 31 07/25/2011   TSH 0.40 07/25/2011         Assessment & Plan:

## 2012-10-06 NOTE — Assessment & Plan Note (Signed)
Doing ok.

## 2012-10-08 ENCOUNTER — Ambulatory Visit: Payer: Medicare Other | Admitting: Internal Medicine

## 2012-11-03 ENCOUNTER — Encounter: Payer: Medicare Other | Admitting: Internal Medicine

## 2012-11-16 ENCOUNTER — Encounter: Payer: Self-pay | Admitting: Internal Medicine

## 2012-11-16 ENCOUNTER — Ambulatory Visit (INDEPENDENT_AMBULATORY_CARE_PROVIDER_SITE_OTHER): Payer: Medicare Other | Admitting: Internal Medicine

## 2012-11-16 VITALS — BP 150/70 | HR 80 | Temp 98.2°F | Resp 16 | Wt 93.0 lb

## 2012-11-16 DIAGNOSIS — I10 Essential (primary) hypertension: Secondary | ICD-10-CM

## 2012-11-16 MED ORDER — LOSARTAN POTASSIUM 50 MG PO TABS: 50.0000 mg | ORAL_TABLET | Freq: Every day | ORAL | Status: DC

## 2012-11-16 NOTE — Assessment & Plan Note (Signed)
Elevated BP per pt Added Losartan 50 mg qhs NAS diet

## 2012-11-16 NOTE — Progress Notes (Signed)
   Subjective:    HPI C/o elev BP: 200/100 this am F/u insomnia - better, no hallucinations  F/u HTN, constipation, GERD. She is eating better now   Wt Readings from Last 3 Encounters:  11/16/12 93 lb (42.185 kg)  10/06/12 94 lb (42.638 kg)  08/23/12 93 lb (42.185 kg)   BP Readings from Last 3 Encounters:  11/16/12 150/70  10/06/12 140/82  08/23/12 118/78     Review of Systems  Constitutional: Negative for activity change, appetite change and unexpected weight change.  HENT: Negative for mouth sores and sinus pressure.   Eyes: Negative for visual disturbance.  Respiratory: Negative for chest tightness.   Gastrointestinal: Negative for abdominal distention.  Genitourinary: Negative for difficulty urinating.  Musculoskeletal: Negative for gait problem.  Skin: Negative for pallor.  Neurological: Negative for tremors.  Psychiatric/Behavioral: Positive for sleep disturbance. Negative for suicidal ideas, behavioral problems, confusion and dysphoric mood. The patient is not nervous/anxious.        Objective:   Physical Exam  Constitutional: She appears well-developed and well-nourished. No distress.  HENT:  Head: Normocephalic.  Right Ear: External ear normal.  Left Ear: External ear normal.  Nose: Nose normal.  Mouth/Throat: Oropharynx is clear and moist.  Eyes: Conjunctivae are normal. Pupils are equal, round, and reactive to light. Right eye exhibits no discharge. Left eye exhibits no discharge.  Neck: Normal range of motion. Neck supple. No JVD present. No tracheal deviation present. No thyromegaly present.  Cardiovascular: Normal rate, regular rhythm and normal heart sounds.   Pulmonary/Chest: No stridor. No respiratory distress. She has no wheezes.  Abdominal: Soft. Bowel sounds are normal. She exhibits no distension and no mass. There is no tenderness. There is no rebound and no guarding.  Musculoskeletal: She exhibits no edema and no tenderness.  Lymphadenopathy:     She has no cervical adenopathy.  Neurological: She displays normal reflexes. No cranial nerve deficit. She exhibits normal muscle tone. Coordination normal.  Skin: No rash noted. No erythema.  Psychiatric: She has a normal mood and affect. Her behavior is normal. Judgment and thought content normal.  better  a/o/c   Lab Results  Component Value Date   WBC 5.4 12/28/2010   HGB 14.5 12/28/2010   HCT 43.3 12/28/2010   PLT 257 12/28/2010   GLUCOSE 123* 07/25/2011   CHOL 279* 12/20/2009   TRIG 169.0* 12/20/2009   HDL 65.20 12/20/2009   LDLDIRECT 177.6 12/20/2009   ALT 12 12/20/2009   AST 19 12/20/2009   NA 139 07/25/2011   K 3.7 07/25/2011   CL 101 07/25/2011   CREATININE 1.0 07/25/2011   BUN 13 07/25/2011   CO2 31 07/25/2011   TSH 0.40 07/25/2011         Assessment & Plan:

## 2012-11-25 ENCOUNTER — Other Ambulatory Visit: Payer: Self-pay | Admitting: Internal Medicine

## 2012-12-08 ENCOUNTER — Ambulatory Visit (INDEPENDENT_AMBULATORY_CARE_PROVIDER_SITE_OTHER): Payer: Medicare Other | Admitting: Internal Medicine

## 2012-12-08 ENCOUNTER — Encounter: Payer: Self-pay | Admitting: Internal Medicine

## 2012-12-08 VITALS — BP 110/70 | HR 76 | Temp 98.2°F | Resp 16 | Ht 61.0 in | Wt 93.4 lb

## 2012-12-08 DIAGNOSIS — R634 Abnormal weight loss: Secondary | ICD-10-CM

## 2012-12-08 DIAGNOSIS — F3289 Other specified depressive episodes: Secondary | ICD-10-CM

## 2012-12-08 DIAGNOSIS — I10 Essential (primary) hypertension: Secondary | ICD-10-CM

## 2012-12-08 DIAGNOSIS — M545 Low back pain, unspecified: Secondary | ICD-10-CM

## 2012-12-08 DIAGNOSIS — F329 Major depressive disorder, single episode, unspecified: Secondary | ICD-10-CM

## 2012-12-08 DIAGNOSIS — F419 Anxiety disorder, unspecified: Secondary | ICD-10-CM

## 2012-12-08 DIAGNOSIS — F411 Generalized anxiety disorder: Secondary | ICD-10-CM

## 2012-12-08 DIAGNOSIS — E785 Hyperlipidemia, unspecified: Secondary | ICD-10-CM

## 2012-12-08 MED ORDER — MIRTAZAPINE 15 MG PO TABS: 15.0000 mg | ORAL_TABLET | Freq: Every day | ORAL | Status: DC

## 2012-12-08 NOTE — Assessment & Plan Note (Signed)
Doing well 

## 2012-12-08 NOTE — Assessment & Plan Note (Signed)
Continue with current prescription therapy as reflected on the Med list.  

## 2012-12-08 NOTE — Progress Notes (Signed)
Pre visit review using our clinic review tool, if applicable. No additional management support is needed unless otherwise documented below in the visit note. 

## 2012-12-08 NOTE — Progress Notes (Signed)
   Subjective:    HPI F/u elev BP: 200/100 -- no relapse F/u insomnia - better, no hallucinations  F/u HTN, constipation, GERD. She is eating better now   Wt Readings from Last 3 Encounters:  12/08/12 93 lb 6 oz (42.355 kg)  11/16/12 93 lb (42.185 kg)  10/06/12 94 lb (42.638 kg)   BP Readings from Last 3 Encounters:  12/08/12 110/70  11/16/12 150/70  10/06/12 140/82     Review of Systems  Constitutional: Negative for activity change, appetite change and unexpected weight change.  HENT: Negative for mouth sores and sinus pressure.   Eyes: Negative for visual disturbance.  Respiratory: Negative for chest tightness.   Gastrointestinal: Negative for abdominal distention.  Genitourinary: Negative for difficulty urinating.  Musculoskeletal: Negative for gait problem.  Skin: Negative for pallor.  Neurological: Negative for tremors.  Psychiatric/Behavioral: Positive for sleep disturbance. Negative for suicidal ideas, behavioral problems, confusion and dysphoric mood. The patient is not nervous/anxious.        Objective:   Physical Exam  Constitutional: She appears well-developed and well-nourished. No distress.  HENT:  Head: Normocephalic.  Right Ear: External ear normal.  Left Ear: External ear normal.  Nose: Nose normal.  Mouth/Throat: Oropharynx is clear and moist.  Eyes: Conjunctivae are normal. Pupils are equal, round, and reactive to light. Right eye exhibits no discharge. Left eye exhibits no discharge.  Neck: Normal range of motion. Neck supple. No JVD present. No tracheal deviation present. No thyromegaly present.  Cardiovascular: Normal rate, regular rhythm and normal heart sounds.   Pulmonary/Chest: No stridor. No respiratory distress. She has no wheezes.  Abdominal: Soft. Bowel sounds are normal. She exhibits no distension and no mass. There is no tenderness. There is no rebound and no guarding.  Musculoskeletal: She exhibits no edema and no tenderness.   Lymphadenopathy:    She has no cervical adenopathy.  Neurological: She displays normal reflexes. No cranial nerve deficit. She exhibits normal muscle tone. Coordination normal.  Skin: No rash noted. No erythema.  Psychiatric: She has a normal mood and affect. Her behavior is normal. Judgment and thought content normal.  better  a/o/c   Lab Results  Component Value Date   WBC 5.4 12/28/2010   HGB 14.5 12/28/2010   HCT 43.3 12/28/2010   PLT 257 12/28/2010   GLUCOSE 123* 07/25/2011   CHOL 279* 12/20/2009   TRIG 169.0* 12/20/2009   HDL 65.20 12/20/2009   LDLDIRECT 177.6 12/20/2009   ALT 12 12/20/2009   AST 19 12/20/2009   NA 139 07/25/2011   K 3.7 07/25/2011   CL 101 07/25/2011   CREATININE 1.0 07/25/2011   BUN 13 07/25/2011   CO2 31 07/25/2011   TSH 0.40 07/25/2011         Assessment & Plan:

## 2012-12-27 ENCOUNTER — Ambulatory Visit (INDEPENDENT_AMBULATORY_CARE_PROVIDER_SITE_OTHER): Payer: Medicare Other | Admitting: Internal Medicine

## 2012-12-27 ENCOUNTER — Encounter: Payer: Self-pay | Admitting: Internal Medicine

## 2012-12-27 VITALS — BP 120/62 | HR 64 | Temp 98.0°F | Resp 16 | Wt 93.0 lb

## 2012-12-27 DIAGNOSIS — I10 Essential (primary) hypertension: Secondary | ICD-10-CM

## 2012-12-27 DIAGNOSIS — F419 Anxiety disorder, unspecified: Secondary | ICD-10-CM

## 2012-12-27 DIAGNOSIS — K5900 Constipation, unspecified: Secondary | ICD-10-CM

## 2012-12-27 DIAGNOSIS — F411 Generalized anxiety disorder: Secondary | ICD-10-CM

## 2012-12-27 DIAGNOSIS — F329 Major depressive disorder, single episode, unspecified: Secondary | ICD-10-CM

## 2012-12-27 DIAGNOSIS — R609 Edema, unspecified: Secondary | ICD-10-CM

## 2012-12-27 DIAGNOSIS — R634 Abnormal weight loss: Secondary | ICD-10-CM

## 2012-12-27 NOTE — Progress Notes (Signed)
   Subjective:    HPI F/u elev BP ( once it was 200/100) -- no relapse F/u insomnia - better, no hallucinations  F/u HTN, constipation, GERD, wt loss. She is eating better now   Wt Readings from Last 3 Encounters:  12/27/12 93 lb (42.185 kg)  12/08/12 93 lb 6 oz (42.355 kg)  11/16/12 93 lb (42.185 kg)   BP Readings from Last 3 Encounters:  12/27/12 120/62  12/08/12 110/70  11/16/12 150/70     Review of Systems  Constitutional: Negative for activity change, appetite change and unexpected weight change.  HENT: Negative for mouth sores and sinus pressure.   Eyes: Negative for visual disturbance.  Respiratory: Negative for chest tightness.   Gastrointestinal: Negative for abdominal distention.  Genitourinary: Negative for difficulty urinating.  Musculoskeletal: Negative for gait problem.  Skin: Negative for pallor.  Neurological: Negative for tremors.  Psychiatric/Behavioral: Positive for sleep disturbance. Negative for suicidal ideas, behavioral problems, confusion and dysphoric mood. The patient is not nervous/anxious.        Objective:   Physical Exam  Constitutional: She appears well-developed and well-nourished. No distress.  HENT:  Head: Normocephalic.  Right Ear: External ear normal.  Left Ear: External ear normal.  Nose: Nose normal.  Mouth/Throat: Oropharynx is clear and moist.  Eyes: Conjunctivae are normal. Pupils are equal, round, and reactive to light. Right eye exhibits no discharge. Left eye exhibits no discharge.  Neck: Normal range of motion. Neck supple. No JVD present. No tracheal deviation present. No thyromegaly present.  Cardiovascular: Normal rate, regular rhythm and normal heart sounds.   Pulmonary/Chest: No stridor. No respiratory distress. She has no wheezes.  Abdominal: Soft. Bowel sounds are normal. She exhibits no distension and no mass. There is no tenderness. There is no rebound and no guarding.  Musculoskeletal: She exhibits no edema and  no tenderness.  Lymphadenopathy:    She has no cervical adenopathy.  Neurological: She displays normal reflexes. No cranial nerve deficit. She exhibits normal muscle tone. Coordination normal.  Skin: No rash noted. No erythema.  Psychiatric: She has a normal mood and affect. Her behavior is normal. Judgment and thought content normal.  better  a/o/c   Lab Results  Component Value Date   WBC 5.4 12/28/2010   HGB 14.5 12/28/2010   HCT 43.3 12/28/2010   PLT 257 12/28/2010   GLUCOSE 123* 07/25/2011   CHOL 279* 12/20/2009   TRIG 169.0* 12/20/2009   HDL 65.20 12/20/2009   LDLDIRECT 177.6 12/20/2009   ALT 12 12/20/2009   AST 19 12/20/2009   NA 139 07/25/2011   K 3.7 07/25/2011   CL 101 07/25/2011   CREATININE 1.0 07/25/2011   BUN 13 07/25/2011   CO2 31 07/25/2011   TSH 0.40 07/25/2011         Assessment & Plan:

## 2012-12-27 NOTE — Assessment & Plan Note (Signed)
Doing well 

## 2012-12-27 NOTE — Assessment & Plan Note (Signed)
Stable

## 2012-12-27 NOTE — Progress Notes (Signed)
Pre visit review using our clinic review tool, if applicable. No additional management support is needed unless otherwise documented below in the visit note. 

## 2012-12-27 NOTE — Assessment & Plan Note (Signed)
Continue with current prescription therapy as reflected on the Med list.  

## 2013-02-03 ENCOUNTER — Ambulatory Visit (INDEPENDENT_AMBULATORY_CARE_PROVIDER_SITE_OTHER): Payer: Medicare Other | Admitting: Internal Medicine

## 2013-02-03 ENCOUNTER — Encounter: Payer: Self-pay | Admitting: Internal Medicine

## 2013-02-03 VITALS — BP 120/70 | HR 80 | Temp 97.6°F | Resp 16 | Wt 93.0 lb

## 2013-02-03 DIAGNOSIS — F329 Major depressive disorder, single episode, unspecified: Secondary | ICD-10-CM

## 2013-02-03 DIAGNOSIS — Z0000 Encounter for general adult medical examination without abnormal findings: Secondary | ICD-10-CM

## 2013-02-03 DIAGNOSIS — I10 Essential (primary) hypertension: Secondary | ICD-10-CM

## 2013-02-03 DIAGNOSIS — H6120 Impacted cerumen, unspecified ear: Secondary | ICD-10-CM

## 2013-02-03 DIAGNOSIS — G4700 Insomnia, unspecified: Secondary | ICD-10-CM

## 2013-02-03 DIAGNOSIS — F3289 Other specified depressive episodes: Secondary | ICD-10-CM

## 2013-02-03 NOTE — Assessment & Plan Note (Signed)
Discussed  Continue with current prescription therapy as reflected on the Med list.  

## 2013-02-03 NOTE — Assessment & Plan Note (Signed)
Continue with current prescription therapy as reflected on the Med list.  

## 2013-02-03 NOTE — Assessment & Plan Note (Signed)
The patient is here for annual Medicare wellness examination and management of other chronic and acute problems.   The risk factors are reflected in the social history.  The roster of all physicians providing medical care to patient - is listed in the Snapshot section of the chart.  Activities of daily living:  The patient is 100% inedpendent in all ADLs: dressing, toileting, feeding as well as independent mobility  Home safety : The patient has smoke detectors in the home. They wear seatbelts. There is no violence in the home.   There is no risks for hepatitis, STDs or HIV. There is no   history of blood transfusion. They have no travel history to infectious disease endemic areas of the world.  The patient has  seen their dentist in the last 12 month. They have  seen their eye doctor in the last year. They deny  Any major hearing difficulty and have not had audiologic testing in the last year.  They do not  have excessive sun exposure. Discussed the need for sun protection: hats, long sleeves and use of sunscreen if there is significant sun exposure.   Diet: the importance of a healthy diet is discussed. They do have a reasonably healthy  diet.  The patient has a fairly regular exercise program of a mixed nature: walking, yard work, etc.The benefits of regular aerobic exercise were discussed.  Depression screen: there are no signs or vegative symptoms of depression- irritability, change in appetite, anhedonia, sadness/tearfullness.  Cognitive assessment: the patient manages all their financial and personal affairs and is actively engaged. They could relate day,date,year and events; recalled 3/3 objects at 3 minutes  The following portions of the patient's history were reviewed and updated as appropriate: allergies, current medications, past family history, past medical history,  past surgical history, past social history  and problem list.  Vision, hearing, body mass index were assessed and  reviewed.   During the course of the visit the patient was educated and counseled about appropriate screening and preventive services including : fall prevention , diabetes screening, nutrition counseling, colorectal cancer screening, and recommended immunizations.  Declined shingles shot, other shots, PAP, colonoscopy

## 2013-02-03 NOTE — Progress Notes (Signed)
Pre visit review using our clinic review tool, if applicable. No additional management support is needed unless otherwise documented below in the visit note. 

## 2013-02-03 NOTE — Progress Notes (Signed)
Subjective:    HPI The patient is here for a wellness exam. The patient has been doing well overall without major physical or psychological issues going on lately. F/u elev BP ( once it was 200/100) -- no relapse, doing well F/u insomnia - better, no hallucinations  F/u HTN, constipation, GERD, wt loss. She is eating better now   Wt Readings from Last 3 Encounters:  02/03/13 93 lb (42.185 kg)  12/27/12 93 lb (42.185 kg)  12/08/12 93 lb 6 oz (42.355 kg)   BP Readings from Last 3 Encounters:  02/03/13 120/70  12/27/12 120/62  12/08/12 110/70     Review of Systems  Constitutional: Negative for activity change, appetite change and unexpected weight change.  HENT: Negative for mouth sores and sinus pressure.   Eyes: Negative for visual disturbance.  Respiratory: Negative for chest tightness.   Gastrointestinal: Negative for abdominal distention.  Genitourinary: Negative for difficulty urinating.  Musculoskeletal: Negative for gait problem.  Skin: Negative for pallor.  Neurological: Negative for tremors.  Psychiatric/Behavioral: Positive for sleep disturbance. Negative for suicidal ideas, behavioral problems, confusion and dysphoric mood. The patient is not nervous/anxious.        Objective:   Physical Exam  Constitutional: She appears well-developed and well-nourished. No distress.  HENT:  Head: Normocephalic.  Right Ear: External ear normal.  Left Ear: External ear normal.  Nose: Nose normal.  Mouth/Throat: Oropharynx is clear and moist.  Eyes: Conjunctivae are normal. Pupils are equal, round, and reactive to light. Right eye exhibits no discharge. Left eye exhibits no discharge.  Neck: Normal range of motion. Neck supple. No JVD present. No tracheal deviation present. No thyromegaly present.  Cardiovascular: Normal rate, regular rhythm and normal heart sounds.   Pulmonary/Chest: No stridor. No respiratory distress. She has no wheezes.  Abdominal: Soft. Bowel sounds  are normal. She exhibits no distension and no mass. There is no tenderness. There is no rebound and no guarding.  Musculoskeletal: She exhibits no edema and no tenderness.  Lymphadenopathy:    She has no cervical adenopathy.  Neurological: She displays normal reflexes. No cranial nerve deficit. She exhibits normal muscle tone. Coordination normal.  Skin: No rash noted. No erythema.  Psychiatric: She has a normal mood and affect. Her behavior is normal. Judgment and thought content normal.  better  a/o/c Wax B  Lab Results  Component Value Date   WBC 5.4 12/28/2010   HGB 14.5 12/28/2010   HCT 43.3 12/28/2010   PLT 257 12/28/2010   GLUCOSE 123* 07/25/2011   CHOL 279* 12/20/2009   TRIG 169.0* 12/20/2009   HDL 65.20 12/20/2009   LDLDIRECT 177.6 12/20/2009   ALT 12 12/20/2009   AST 19 12/20/2009   NA 139 07/25/2011   K 3.7 07/25/2011   CL 101 07/25/2011   CREATININE 1.0 07/25/2011   BUN 13 07/25/2011   CO2 31 07/25/2011   TSH 0.40 07/25/2011     Procedure Note :     Procedure :  Ear irrigation   Indication:  Cerumen impaction B   Risks, including pain, dizziness, eardrum perforation, bleeding, infection and others as well as benefits were explained to the patient in detail. Verbal consent was obtained and the patient agreed to proceed.    We used "The Elephant Ear Irrigation Device" filled with lukewarm water for irrigation. A large amount wax was recovered. Procedure has also required manual wax removal with an ear loop.   Tolerated well. Complications: None.   Postprocedure instructions :  Call if problems.       Assessment & Plan:

## 2013-02-03 NOTE — Assessment & Plan Note (Signed)
See procedure 

## 2013-04-01 ENCOUNTER — Ambulatory Visit: Payer: Medicare Other | Admitting: Internal Medicine

## 2013-04-04 ENCOUNTER — Ambulatory Visit: Payer: Medicare Other | Admitting: Internal Medicine

## 2013-04-13 ENCOUNTER — Ambulatory Visit (INDEPENDENT_AMBULATORY_CARE_PROVIDER_SITE_OTHER): Payer: Medicare Other | Admitting: Internal Medicine

## 2013-04-13 ENCOUNTER — Encounter: Payer: Self-pay | Admitting: Internal Medicine

## 2013-04-13 VITALS — BP 140/72 | HR 72 | Temp 97.5°F | Resp 16 | Wt 93.0 lb

## 2013-04-13 DIAGNOSIS — F3289 Other specified depressive episodes: Secondary | ICD-10-CM

## 2013-04-13 DIAGNOSIS — I10 Essential (primary) hypertension: Secondary | ICD-10-CM

## 2013-04-13 DIAGNOSIS — F329 Major depressive disorder, single episode, unspecified: Secondary | ICD-10-CM

## 2013-04-13 DIAGNOSIS — K219 Gastro-esophageal reflux disease without esophagitis: Secondary | ICD-10-CM

## 2013-04-13 NOTE — Progress Notes (Signed)
Pre visit review using our clinic review tool, if applicable. No additional management support is needed unless otherwise documented below in the visit note. 

## 2013-04-13 NOTE — Progress Notes (Signed)
   Subjective:    HPI . F/u elev BP ( once it was 200/100) -- no relapse, doing well: using Norvasc only F/u insomnia - better, no hallucinations  F/u HTN, constipation, GERD, wt loss. She is eating well now   Wt Readings from Last 3 Encounters:  04/13/13 93 lb (42.185 kg)  02/03/13 93 lb (42.185 kg)  12/27/12 93 lb (42.185 kg)   BP Readings from Last 3 Encounters:  04/13/13 140/72  02/03/13 120/70  12/27/12 120/62     Review of Systems  Constitutional: Negative for activity change, appetite change and unexpected weight change.  HENT: Negative for mouth sores and sinus pressure.   Eyes: Negative for visual disturbance.  Respiratory: Negative for chest tightness.   Gastrointestinal: Negative for abdominal distention.  Genitourinary: Negative for difficulty urinating.  Musculoskeletal: Negative for gait problem.  Skin: Negative for pallor.  Neurological: Negative for tremors.  Psychiatric/Behavioral: Positive for sleep disturbance. Negative for suicidal ideas, behavioral problems, confusion and dysphoric mood. The patient is not nervous/anxious.        Objective:   Physical Exam  Constitutional: She appears well-developed and well-nourished. No distress.  HENT:  Head: Normocephalic.  Right Ear: External ear normal.  Left Ear: External ear normal.  Nose: Nose normal.  Mouth/Throat: Oropharynx is clear and moist.  Eyes: Conjunctivae are normal. Pupils are equal, round, and reactive to light. Right eye exhibits no discharge. Left eye exhibits no discharge.  Neck: Normal range of motion. Neck supple. No JVD present. No tracheal deviation present. No thyromegaly present.  Cardiovascular: Normal rate, regular rhythm and normal heart sounds.   Pulmonary/Chest: No stridor. No respiratory distress. She has no wheezes.  Abdominal: Soft. Bowel sounds are normal. She exhibits no distension and no mass. There is no tenderness. There is no rebound and no guarding.   Musculoskeletal: She exhibits no edema and no tenderness.  Lymphadenopathy:    She has no cervical adenopathy.  Neurological: She displays normal reflexes. No cranial nerve deficit. She exhibits normal muscle tone. Coordination normal.  Skin: No rash noted. No erythema.  Psychiatric: She has a normal mood and affect. Her behavior is normal. Judgment and thought content normal.  better  a/o/c Wax B  Lab Results  Component Value Date   WBC 5.4 12/28/2010   HGB 14.5 12/28/2010   HCT 43.3 12/28/2010   PLT 257 12/28/2010   GLUCOSE 123* 07/25/2011   CHOL 279* 12/20/2009   TRIG 169.0* 12/20/2009   HDL 65.20 12/20/2009   LDLDIRECT 177.6 12/20/2009   ALT 12 12/20/2009   AST 19 12/20/2009   NA 139 07/25/2011   K 3.7 07/25/2011   CL 101 07/25/2011   CREATININE 1.0 07/25/2011   BUN 13 07/25/2011   CO2 31 07/25/2011   TSH 0.40 07/25/2011         Assessment & Plan:

## 2013-04-14 ENCOUNTER — Telehealth: Payer: Self-pay | Admitting: Internal Medicine

## 2013-04-14 NOTE — Telephone Encounter (Signed)
emmi mailed to patient °

## 2013-04-24 NOTE — Assessment & Plan Note (Signed)
Continue with current prescription therapy as reflected on the Med list.  

## 2013-05-10 ENCOUNTER — Other Ambulatory Visit (INDEPENDENT_AMBULATORY_CARE_PROVIDER_SITE_OTHER): Payer: Medicare Other

## 2013-05-10 ENCOUNTER — Other Ambulatory Visit: Payer: Self-pay | Admitting: Internal Medicine

## 2013-05-10 DIAGNOSIS — I10 Essential (primary) hypertension: Secondary | ICD-10-CM

## 2013-05-10 LAB — BASIC METABOLIC PANEL
BUN: 10 mg/dL (ref 6–23)
CO2: 33 meq/L — AB (ref 19–32)
Calcium: 9.2 mg/dL (ref 8.4–10.5)
Chloride: 98 mEq/L (ref 96–112)
Creatinine, Ser: 0.9 mg/dL (ref 0.4–1.2)
GFR: 75.43 mL/min (ref >60.00)
Glucose, Bld: 105 mg/dL — ABNORMAL HIGH (ref 70–99)
POTASSIUM: 2.6 meq/L — AB (ref 3.5–5.1)
SODIUM: 140 meq/L (ref 135–145)

## 2013-05-10 MED ORDER — POTASSIUM CHLORIDE ER 10 MEQ PO TBCR: 10.0000 meq | EXTENDED_RELEASE_TABLET | Freq: Every day | ORAL | Status: DC

## 2013-05-13 ENCOUNTER — Ambulatory Visit (INDEPENDENT_AMBULATORY_CARE_PROVIDER_SITE_OTHER): Payer: Medicare Other | Admitting: Internal Medicine

## 2013-05-13 ENCOUNTER — Encounter: Payer: Self-pay | Admitting: Internal Medicine

## 2013-05-13 VITALS — BP 110/60 | HR 68 | Temp 98.4°F | Resp 16 | Wt 92.0 lb

## 2013-05-13 DIAGNOSIS — K589 Irritable bowel syndrome without diarrhea: Secondary | ICD-10-CM

## 2013-05-13 DIAGNOSIS — R141 Gas pain: Secondary | ICD-10-CM

## 2013-05-13 DIAGNOSIS — R142 Eructation: Secondary | ICD-10-CM

## 2013-05-13 DIAGNOSIS — R143 Flatulence: Secondary | ICD-10-CM

## 2013-05-13 NOTE — Patient Instructions (Signed)
Try activated charcoal tablets for gas 

## 2013-05-13 NOTE — Progress Notes (Signed)
Pre visit review using our clinic review tool, if applicable. No additional management support is needed unless otherwise documented below in the visit note. 

## 2013-05-13 NOTE — Progress Notes (Signed)
   Subjective:    HPI C/o abd pain and bloating - worse. Potassium is making her stomach hurt... F/u elev BP ( once it was 200/100) -- no relapse, doing well: using Norvasc only F/u insomnia - better, no hallucinations  F/u HTN, constipation, GERD, wt loss. She is eating well now   Wt Readings from Last 3 Encounters:  05/13/13 92 lb (41.731 kg)  04/13/13 93 lb (42.185 kg)  02/03/13 93 lb (42.185 kg)   BP Readings from Last 3 Encounters:  05/13/13 110/60  04/13/13 140/72  02/03/13 120/70     Review of Systems  Constitutional: Negative for activity change, appetite change and unexpected weight change.  HENT: Negative for mouth sores and sinus pressure.   Eyes: Negative for visual disturbance.  Respiratory: Negative for chest tightness.   Gastrointestinal: Negative for abdominal distention.  Genitourinary: Negative for difficulty urinating.  Musculoskeletal: Negative for gait problem.  Skin: Negative for pallor.  Neurological: Negative for tremors.  Psychiatric/Behavioral: Positive for sleep disturbance. Negative for suicidal ideas, behavioral problems, confusion and dysphoric mood. The patient is not nervous/anxious.        Objective:   Physical Exam  Constitutional: She appears well-developed and well-nourished. No distress.  HENT:  Head: Normocephalic.  Right Ear: External ear normal.  Left Ear: External ear normal.  Nose: Nose normal.  Mouth/Throat: Oropharynx is clear and moist.  Eyes: Conjunctivae are normal. Pupils are equal, round, and reactive to light. Right eye exhibits no discharge. Left eye exhibits no discharge.  Neck: Normal range of motion. Neck supple. No JVD present. No tracheal deviation present. No thyromegaly present.  Cardiovascular: Normal rate, regular rhythm and normal heart sounds.   Pulmonary/Chest: No stridor. No respiratory distress. She has no wheezes.  Abdominal: Soft. Bowel sounds are normal. She exhibits no distension and no mass. There  is no tenderness. There is no rebound and no guarding.  Musculoskeletal: She exhibits no edema and no tenderness.  Lymphadenopathy:    She has no cervical adenopathy.  Neurological: She displays normal reflexes. No cranial nerve deficit. She exhibits normal muscle tone. Coordination normal.  Skin: No rash noted. No erythema.  Psychiatric: She has a normal mood and affect. Her behavior is normal. Judgment and thought content normal.  better  a/o/c Wax B  Lab Results  Component Value Date   WBC 5.4 12/28/2010   HGB 14.5 12/28/2010   HCT 43.3 12/28/2010   PLT 257 12/28/2010   GLUCOSE 105* 05/10/2013   CHOL 279* 12/20/2009   TRIG 169.0* 12/20/2009   HDL 65.20 12/20/2009   LDLDIRECT 177.6 12/20/2009   ALT 12 12/20/2009   AST 19 12/20/2009   NA 140 05/10/2013   K 2.6* 05/10/2013   CL 98 05/10/2013   CREATININE 0.9 05/10/2013   BUN 10 05/10/2013   CO2 33* 05/10/2013   TSH 0.40 07/25/2011         Assessment & Plan:

## 2013-05-15 NOTE — Assessment & Plan Note (Signed)
Cont w/Pepcid Diet

## 2013-05-15 NOTE — Assessment & Plan Note (Signed)
Cont w/Pepcid Diet 

## 2013-05-19 ENCOUNTER — Telehealth: Payer: Self-pay | Admitting: Internal Medicine

## 2013-05-19 NOTE — Telephone Encounter (Signed)
Patient states her blood pressure has been running low.  Therefore she has not taken her blood pressure meds in two days.  She took her blood pressure today 111/72 pulse 81 and 104/74 pulse 92.  She would like a phone call in regards.

## 2013-05-19 NOTE — Telephone Encounter (Signed)
I called pt- she states she felt light headed before her BP elevated. She states last time she checked it at 2:00 pm it was 116/70 P 81. She c/o occasional sharp pains in head. She denies any other symptoms. I advised her to keep a record of her BP readings and call with any change OR new/worsening symptoms. Otherwise keep her OV as scheduled in 06/2013.  Also, she wants to know if she can ice cream since MD advised Almond milk. Please advise.

## 2013-05-20 NOTE — Telephone Encounter (Signed)
Take Amlodipine 1/2 tab/day Thx

## 2013-05-20 NOTE — Telephone Encounter (Signed)
I called patient and she states she has been taking a 1/2 tablet of the Amlodipine per previous instructions you had given her.

## 2013-06-06 ENCOUNTER — Other Ambulatory Visit: Payer: Self-pay | Admitting: *Deleted

## 2013-06-06 NOTE — Telephone Encounter (Signed)
Pt called requesting sleep medication refill.  Please advise

## 2013-06-06 NOTE — Telephone Encounter (Signed)
Pt called stated she got melatonin from [facility], but pt was wondering if she can take it along with other med that she is on. Please advise.

## 2013-06-07 MED ORDER — LORAZEPAM 0.5 MG PO TABS: 0.5000 mg | ORAL_TABLET | Freq: Every evening | ORAL | Status: DC | PRN

## 2013-06-07 NOTE — Telephone Encounter (Signed)
Noted OK Lorazepam at hs prn Thx

## 2013-06-07 NOTE — Telephone Encounter (Signed)
Yes. As needed 2-3 nights per week Thx

## 2013-06-07 NOTE — Telephone Encounter (Signed)
Called pt to inform of Dr. Camila Li response. Pt stated she is not comfortable to take the melatonin; so pt request for Dr. Camila Li to send in something that he think might be good for her. Please advise

## 2013-06-07 NOTE — Telephone Encounter (Signed)
Notified pt md ok lorazepam want rx sent to [facility]...Johny Chess

## 2013-06-08 ENCOUNTER — Telehealth: Payer: Self-pay | Admitting: Internal Medicine

## 2013-06-08 NOTE — Telephone Encounter (Signed)
Called [facility] to confirm if rx was received yesterday on pt lorazepam. Spoke with Lattie Haw she states they did received med has been filled and is waiting for pick-up. Inform pt rx was sent yesterday and pharmacy did received waiting on her to pick-up...Lindsey Dickson

## 2013-06-08 NOTE — Telephone Encounter (Signed)
Patient states that she just called [facility] [address] and and they did not have her Lorazepam rx. She is asking that we re-send her lorazepam rx to sent to [facility].

## 2013-07-19 ENCOUNTER — Encounter: Payer: Self-pay | Admitting: Internal Medicine

## 2013-07-19 ENCOUNTER — Ambulatory Visit (INDEPENDENT_AMBULATORY_CARE_PROVIDER_SITE_OTHER): Payer: Medicare Other | Admitting: Internal Medicine

## 2013-07-19 VITALS — BP 128/64 | HR 68 | Temp 98.2°F | Resp 16 | Wt 92.0 lb

## 2013-07-19 DIAGNOSIS — H6120 Impacted cerumen, unspecified ear: Secondary | ICD-10-CM

## 2013-07-19 DIAGNOSIS — K589 Irritable bowel syndrome without diarrhea: Secondary | ICD-10-CM

## 2013-07-19 DIAGNOSIS — M545 Low back pain, unspecified: Secondary | ICD-10-CM

## 2013-07-19 DIAGNOSIS — R634 Abnormal weight loss: Secondary | ICD-10-CM

## 2013-07-19 DIAGNOSIS — E785 Hyperlipidemia, unspecified: Secondary | ICD-10-CM

## 2013-07-19 DIAGNOSIS — H6123 Impacted cerumen, bilateral: Secondary | ICD-10-CM

## 2013-07-19 DIAGNOSIS — I10 Essential (primary) hypertension: Secondary | ICD-10-CM

## 2013-07-19 MED ORDER — POTASSIUM CHLORIDE ER 10 MEQ PO TBCR: 10.0000 meq | EXTENDED_RELEASE_TABLET | Freq: Every day | ORAL | Status: DC

## 2013-07-19 MED ORDER — LORAZEPAM 0.5 MG PO TABS: 0.5000 mg | ORAL_TABLET | Freq: Every evening | ORAL | Status: DC | PRN

## 2013-07-19 MED ORDER — AMLODIPINE BESYLATE 5 MG PO TABS: 2.5000 mg | ORAL_TABLET | Freq: Every day | ORAL | Status: DC

## 2013-07-19 NOTE — Assessment & Plan Note (Signed)
Continue with current prescription therapy as reflected on the Med list.  

## 2013-07-19 NOTE — Assessment & Plan Note (Signed)
Resolved

## 2013-07-19 NOTE — Progress Notes (Signed)
Pre visit review using our clinic review tool, if applicable. No additional management support is needed unless otherwise documented below in the visit note. 

## 2013-07-19 NOTE — Progress Notes (Signed)
Patient ID: Lindsey Dickson, female   DOB: 07-21-1920, 80 y.o.   MRN: [id number]   Subjective:    HPI F/u abd pain and bloating - resolved. F/u elev BP ( once it was 200/100) -- no relapse, doing well: using Norvasc only F/u insomnia - better, no hallucinations  F/u HTN, constipation, GERD, wt loss. She is eating well now   Wt Readings from Last 3 Encounters:  07/19/13 92 lb (41.731 kg)  05/13/13 92 lb (41.731 kg)  04/13/13 93 lb (42.185 kg)   BP Readings from Last 3 Encounters:  07/19/13 128/64  05/13/13 110/60  04/13/13 140/72     Review of Systems  Constitutional: Negative for activity change, appetite change and unexpected weight change.  HENT: Negative for mouth sores and sinus pressure.   Eyes: Negative for visual disturbance.  Respiratory: Negative for chest tightness.   Gastrointestinal: Negative for abdominal distention.  Genitourinary: Negative for difficulty urinating.  Musculoskeletal: Negative for gait problem.  Skin: Negative for pallor.  Neurological: Negative for tremors.  Psychiatric/Behavioral: Positive for sleep disturbance. Negative for suicidal ideas, behavioral problems, confusion and dysphoric mood. The patient is not nervous/anxious.        Objective:   Physical Exam  Constitutional: She appears well-developed and well-nourished. No distress.  HENT:  Head: Normocephalic.  Right Ear: External ear normal.  Left Ear: External ear normal.  Nose: Nose normal.  Mouth/Throat: Oropharynx is clear and moist.  Eyes: Conjunctivae are normal. Pupils are equal, round, and reactive to light. Right eye exhibits no discharge. Left eye exhibits no discharge.  Neck: Normal range of motion. Neck supple. No JVD present. No tracheal deviation present. No thyromegaly present.  Cardiovascular: Normal rate, regular rhythm and normal heart sounds.   Pulmonary/Chest: No stridor. No respiratory distress. She has no wheezes.  Abdominal: Soft. Bowel sounds are normal. She  exhibits no distension and no mass. There is no tenderness. There is no rebound and no guarding.  Musculoskeletal: She exhibits no edema and no tenderness.  Lymphadenopathy:    She has no cervical adenopathy.  Neurological: She displays normal reflexes. No cranial nerve deficit. She exhibits normal muscle tone. Coordination normal.  Skin: No rash noted. No erythema.  Psychiatric: She has a normal mood and affect. Her behavior is normal. Judgment and thought content normal.  better  a/o/c Wax B  Lab Results  Component Value Date   WBC 5.4 12/28/2010   HGB 14.5 12/28/2010   HCT 43.3 12/28/2010   PLT 257 12/28/2010   GLUCOSE 105* 05/10/2013   CHOL 279* 12/20/2009   TRIG 169.0* 12/20/2009   HDL 65.20 12/20/2009   LDLDIRECT 177.6 12/20/2009   ALT 12 12/20/2009   AST 19 12/20/2009   NA 140 05/10/2013   K 2.6* 05/10/2013   CL 98 05/10/2013   CREATININE 0.9 05/10/2013   BUN 10 05/10/2013   CO2 33* 05/10/2013   TSH 0.40 07/25/2011         Assessment & Plan:

## 2013-07-19 NOTE — Assessment & Plan Note (Signed)
Doing well 

## 2013-08-15 ENCOUNTER — Emergency Department (HOSPITAL_COMMUNITY)
Admission: EM | Admit: 2013-08-15 | Discharge: 2013-08-15 | Disposition: A | Discharge status: Home / Self Care | Payer: Medicare Other | Attending: Emergency Medicine | Admitting: Emergency Medicine

## 2013-08-15 ENCOUNTER — Encounter (HOSPITAL_COMMUNITY): Payer: Self-pay | Admitting: Emergency Medicine

## 2013-08-15 ENCOUNTER — Emergency Department (HOSPITAL_COMMUNITY): Payer: Medicare Other

## 2013-08-15 DIAGNOSIS — K219 Gastro-esophageal reflux disease without esophagitis: Secondary | ICD-10-CM | POA: Insufficient documentation

## 2013-08-15 DIAGNOSIS — I509 Heart failure, unspecified: Secondary | ICD-10-CM | POA: Diagnosis not present

## 2013-08-15 DIAGNOSIS — E876 Hypokalemia: Secondary | ICD-10-CM | POA: Diagnosis not present

## 2013-08-15 DIAGNOSIS — F329 Major depressive disorder, single episode, unspecified: Secondary | ICD-10-CM | POA: Insufficient documentation

## 2013-08-15 DIAGNOSIS — Z860100 Personal history of colon polyps, unspecified: Secondary | ICD-10-CM | POA: Insufficient documentation

## 2013-08-15 DIAGNOSIS — K5900 Constipation, unspecified: Secondary | ICD-10-CM | POA: Diagnosis not present

## 2013-08-15 DIAGNOSIS — IMO0002 Reserved for concepts with insufficient information to code with codable children: Secondary | ICD-10-CM | POA: Insufficient documentation

## 2013-08-15 DIAGNOSIS — Z79899 Other long term (current) drug therapy: Secondary | ICD-10-CM | POA: Diagnosis not present

## 2013-08-15 DIAGNOSIS — F3289 Other specified depressive episodes: Secondary | ICD-10-CM | POA: Insufficient documentation

## 2013-08-15 DIAGNOSIS — I10 Essential (primary) hypertension: Secondary | ICD-10-CM | POA: Diagnosis not present

## 2013-08-15 DIAGNOSIS — Z880 Allergy status to penicillin: Secondary | ICD-10-CM | POA: Diagnosis not present

## 2013-08-15 DIAGNOSIS — E785 Hyperlipidemia, unspecified: Secondary | ICD-10-CM | POA: Diagnosis not present

## 2013-08-15 LAB — I-STAT CHEM 8, ED
BUN: 7 mg/dL (ref 6–23)
CHLORIDE: 96 meq/L (ref 96–112)
Calcium, Ion: 1.13 mmol/L (ref 1.13–1.30)
Creatinine, Ser: 0.9 mg/dL (ref 0.50–1.10)
Glucose, Bld: 148 mg/dL — ABNORMAL HIGH (ref 70–99)
HCT: 47 % — ABNORMAL HIGH (ref 36.0–46.0)
Hemoglobin: 16 g/dL — ABNORMAL HIGH (ref 12.0–15.0)
POTASSIUM: 3 meq/L — AB (ref 3.7–5.3)
SODIUM: 138 meq/L (ref 137–147)
TCO2: 32 mmol/L (ref 0–100)

## 2013-08-15 MED ORDER — POTASSIUM CHLORIDE CRYS ER 20 MEQ PO TBCR: 20.0000 meq | EXTENDED_RELEASE_TABLET | Freq: Once | ORAL | Status: DC

## 2013-08-15 MED ORDER — POTASSIUM CHLORIDE CRYS ER 20 MEQ PO TBCR
40.0000 meq | EXTENDED_RELEASE_TABLET | Freq: Once | ORAL | Status: AC
  Administered 2013-08-15: 40 meq via ORAL
  Filled 2013-08-15: qty 2

## 2013-08-15 MED ORDER — TRAMADOL HCL 50 MG PO TABS
50.0000 mg | ORAL_TABLET | Freq: Once | ORAL | Status: AC
  Administered 2013-08-15: 50 mg via ORAL
  Filled 2013-08-15: qty 1

## 2013-08-15 MED ORDER — ACETAMINOPHEN 500 MG PO TABS
1000.0000 mg | ORAL_TABLET | Freq: Once | ORAL | Status: AC
  Administered 2013-08-15: 1000 mg via ORAL
  Filled 2013-08-15: qty 2

## 2013-08-15 NOTE — Discharge Instructions (Signed)
DASH Eating Plan DASH stands for "Dietary Approaches to Stop Hypertension." The DASH eating plan is a healthy eating plan that has been shown to reduce high blood pressure (hypertension). Additional health benefits may include reducing the risk of type 2 diabetes mellitus, heart disease, and stroke. The DASH eating plan may also help with weight loss. WHAT DO I NEED TO KNOW ABOUT THE DASH EATING PLAN? For the DASH eating plan, you will follow these general guidelines:  Choose foods with a percent daily value for sodium of less than 5% (as listed on the food label).  Use salt-free seasonings or herbs instead of table salt or sea salt.  Check with your health care provider or pharmacist before using salt substitutes.  Eat lower-sodium products, often labeled as "lower sodium" or "no salt added."  Eat fresh foods.  Eat more vegetables, fruits, and low-fat dairy products.  Choose whole grains. Look for the word "whole" as the first word in the ingredient list.  Choose fish and skinless chicken or turkey more often than red meat. Limit fish, poultry, and meat to 6 oz (170 g) each day.  Limit sweets, desserts, sugars, and sugary drinks.  Choose heart-healthy fats.  Limit cheese to 1 oz (28 g) per day.  Eat more home-cooked food and less restaurant, buffet, and fast food.  Limit fried foods.  Cook foods using methods other than frying.  Limit canned vegetables. If you do use them, rinse them well to decrease the sodium.  When eating at a restaurant, ask that your food be prepared with less salt, or no salt if possible. WHAT FOODS CAN I EAT? Seek help from a dietitian for individual calorie needs. Grains Whole grain or whole wheat bread. Brown rice. Whole grain or whole wheat pasta. Quinoa, bulgur, and whole grain cereals. Low-sodium cereals. Corn or whole wheat flour tortillas. Whole grain cornbread. Whole grain crackers. Low-sodium crackers. Vegetables Fresh or frozen vegetables  (raw, steamed, roasted, or grilled). Low-sodium or reduced-sodium tomato and vegetable juices. Low-sodium or reduced-sodium tomato sauce and paste. Low-sodium or reduced-sodium canned vegetables.  Fruits All fresh, canned (in natural juice), or frozen fruits. Meat and Other Protein Products Ground beef (85% or leaner), grass-fed beef, or beef trimmed of fat. Skinless chicken or turkey. Ground chicken or turkey. Pork trimmed of fat. All fish and seafood. Eggs. Dried beans, peas, or lentils. Unsalted nuts and seeds. Unsalted canned beans. Dairy Low-fat dairy products, such as skim or 1% milk, 2% or reduced-fat cheeses, low-fat ricotta or cottage cheese, or plain low-fat yogurt. Low-sodium or reduced-sodium cheeses. Fats and Oils Tub margarines without trans fats. Light or reduced-fat mayonnaise and salad dressings (reduced sodium). Avocado. Safflower, olive, or canola oils. Natural peanut or almond butter. Other Unsalted popcorn and pretzels. The items listed above may not be a complete list of recommended foods or beverages. Contact your dietitian for more options. WHAT FOODS ARE NOT RECOMMENDED? Grains White bread. White pasta. White rice. Refined cornbread. Bagels and croissants. Crackers that contain trans fat. Vegetables Creamed or fried vegetables. Vegetables in a cheese sauce. Regular canned vegetables. Regular canned tomato sauce and paste. Regular tomato and vegetable juices. Fruits Dried fruits. Canned fruit in light or heavy syrup. Fruit juice. Meat and Other Protein Products Fatty cuts of meat. Ribs, chicken wings, bacon, sausage, bologna, salami, chitterlings, fatback, hot dogs, bratwurst, and packaged luncheon meats. Salted nuts and seeds. Canned beans with salt. Dairy Whole or 2% milk, cream, half-and-half, and cream cheese. Whole-fat or sweetened yogurt. Full-fat   cheeses or blue cheese. Nondairy creamers and whipped toppings. Processed cheese, cheese spreads, or cheese  curds. Condiments Onion and garlic salt, seasoned salt, table salt, and sea salt. Canned and packaged gravies. Worcestershire sauce. Tartar sauce. Barbecue sauce. Teriyaki sauce. Soy sauce, including reduced sodium. Steak sauce. Fish sauce. Oyster sauce. Cocktail sauce. Horseradish. Ketchup and mustard. Meat flavorings and tenderizers. Bouillon cubes. Hot sauce. Tabasco sauce. Marinades. Taco seasonings. Relishes. Fats and Oils Butter, stick margarine, lard, shortening, ghee, and bacon fat. Coconut, palm kernel, or palm oils. Regular salad dressings. Other Pickles and olives. Salted popcorn and pretzels. The items listed above may not be a complete list of foods and beverages to avoid. Contact your dietitian for more information. WHERE CAN I FIND MORE INFORMATION? National Heart, [employer], [employer]: www.nhlbi.nih.gov/health/health-topics/topics/dash/ Document Released: 12/26/2010 Document Revised: 05/23/2013 Document Reviewed: 11/10/2012 ExitCare Patient Information 2015 [employer]. This information is not intended to replace advice given to you by your health care provider. Make sure you discuss any questions you have with your health care provider. Hypertension Hypertension, commonly called high blood pressure, is when the force of blood pumping through your arteries is too strong. Your arteries are the blood vessels that carry blood from your heart throughout your body. A blood pressure reading consists of a higher number over a lower number, such as 110/72. The higher number (systolic) is the pressure inside your arteries when your heart pumps. The lower number (diastolic) is the pressure inside your arteries when your heart relaxes. Ideally you want your blood pressure below 120/80. Hypertension forces your heart to work harder to pump blood. Your arteries may become narrow or stiff. Having hypertension puts you at risk for heart disease, stroke, and other problems.  RISK  FACTORS Some risk factors for high blood pressure are controllable. Others are not.  Risk factors you cannot control include:   Race. You may be at higher risk if you are African American.  Age. Risk increases with age.  Gender. Men are at higher risk than women before age 45 years. After age 65, women are at higher risk than men. Risk factors you can control include:  Not getting enough exercise or physical activity.  Being overweight.  Getting too much fat, sugar, calories, or salt in your diet.  Drinking too much alcohol. SIGNS AND SYMPTOMS Hypertension does not usually cause signs or symptoms. Extremely high blood pressure (hypertensive crisis) may cause headache, anxiety, shortness of breath, and nosebleed. DIAGNOSIS  To check if you have hypertension, your health care provider will measure your blood pressure while you are seated, with your arm held at the level of your heart. It should be measured at least twice using the same arm. Certain conditions can cause a difference in blood pressure between your right and left arms. A blood pressure reading that is higher than normal on one occasion does not mean that you need treatment. If one blood pressure reading is high, ask your health care provider about having it checked again. TREATMENT  Treating high blood pressure includes making lifestyle changes and possibly taking medicine. Living a healthy lifestyle can help lower high blood pressure. You may need to change some of your habits. Lifestyle changes may include:  Following the DASH diet. This diet is high in fruits, vegetables, and whole grains. It is low in salt, red meat, and added sugars.  Getting at least 2 hours of brisk physical activity every week.  Losing weight if necessary.  Not smoking.  Limiting   alcoholic beverages.  Learning ways to reduce stress. If lifestyle changes are not enough to get your blood pressure under control, your health care provider may  prescribe medicine. You may need to take more than one. Work closely with your health care provider to understand the risks and benefits. HOME CARE INSTRUCTIONS  Have your blood pressure rechecked as directed by your health care provider.   Take medicines only as directed by your health care provider. Follow the directions carefully. Blood pressure medicines must be taken as prescribed. The medicine does not work as well when you skip doses. Skipping doses also puts you at risk for problems.   Do not smoke.   Monitor your blood pressure at home as directed by your health care provider. SEEK MEDICAL CARE IF:   You think you are having a reaction to medicines taken.  You have recurrent headaches or feel dizzy.  You have swelling in your ankles.  You have trouble with your vision. SEEK IMMEDIATE MEDICAL CARE IF:  You develop a severe headache or confusion.  You have unusual weakness, numbness, or feel faint.  You have severe chest or abdominal pain.  You vomit repeatedly.  You have trouble breathing. MAKE SURE YOU:   Understand these instructions.  Will watch your condition.  Will get help right away if you are not doing well or get worse. Document Released: 01/06/2005 Document Revised: 05/23/2013 Document Reviewed: 10/29/2012 ExitCare Patient Information 2015 [employer]. This information is not intended to replace advice given to you by your health care provider. Make sure you discuss any questions you have with your health care provider.  

## 2013-08-15 NOTE — ED Provider Notes (Signed)
CSN: [id number]     Arrival date & time 08/15/13  0013 History   First MD Initiated Contact with Patient 08/15/13 [phone]     Chief Complaint  Patient presents with  . Hypertension     (Consider location/radiation/quality/duration/timing/severity/associated sxs/prior Treatment) Patient is a 80 y.o. female presenting with hypertension. The history is provided by the patient.  Hypertension This is a chronic problem. The current episode started more than 1 week ago. The problem occurs constantly. The problem has not changed since onset.Pertinent negatives include no chest pain, no abdominal pain and no shortness of breath. Nothing aggravates the symptoms. Nothing relieves the symptoms. Treatments tried: took dose of BP med. The treatment provided moderate relief.    Past Medical History  Diagnosis Date  . IBS (irritable bowel syndrome)   . Constipation, chronic   . Allergic rhinitis   . CHF (congestive heart failure)   . Depression   . Diverticulitis   . GERD (gastroesophageal reflux disease)   . Hyperlipidemia   . LBP (low back pain)   . Colon polyps   . Hypertension    Past Surgical History  Procedure Laterality Date  . Appendectomy    . Tubal ligation    . Cataract extraction     Family History  Problem Relation Age of Onset  . Hypertension Mother   . Lung cancer Brother   . Cancer Brother     lung   History  Substance Use Topics  . Smoking status: Never Smoker   . Smokeless tobacco: Not on file  . Alcohol Use: No   OB History   Grav Para Term Preterm Abortions TAB SAB Ect Mult Living                 Review of Systems  Respiratory: Negative for shortness of breath.   Cardiovascular: Negative for chest pain, palpitations and leg swelling.  Gastrointestinal: Negative for abdominal pain.  All other systems reviewed and are negative.     Allergies  Ciprofloxacin; Hydrochlorothiazide; Penicillins; and Sulfonamide derivatives  Home Medications   Prior to  Admission medications   Medication Sig Start Date End Date Taking? Authorizing Provider  amLODipine (NORVASC) 5 MG tablet Take 0.5 tablets (2.5 mg total) by mouth daily. 07/19/13 07/19/14  Aleksei Plotnikov V, MD  Cholecalciferol 1000 UNITS tablet Take 1,000 Units by mouth daily.      Historical Provider, MD  famotidine (PEPCID) 20 MG tablet Take 1 tablet (20 mg total) by mouth 2 (two) times daily as needed. 08/23/12 12/02/13  Aleksei Plotnikov V, MD  loratadine (CLARITIN) 10 MG tablet Take 10 mg by mouth daily as needed.     Historical Provider, MD  LORazepam (ATIVAN) 0.5 MG tablet Take 1-2 tablets (0.5-1 mg total) by mouth at bedtime as needed for sleep. 07/19/13 01/11/14  Aleksei Plotnikov V, MD  magnesium hydroxide (MILK OF MAGNESIA) 400 MG/5ML suspension Take 7.5 mLs by mouth daily as needed.     Historical Provider, MD  mirtazapine (REMERON) 15 MG tablet Take 1 tablet (15 mg total) by mouth at bedtime. Take at 4-5 pm 12/08/12   Aleksei Plotnikov V, MD  potassium chloride (KLOR-CON 10) 10 MEQ tablet Take 1 tablet (10 mEq total) by mouth daily. 07/19/13   Aleksei Plotnikov V, MD  Probiotic Product (ALIGN) 4 MG CAPS Take by mouth daily.      Historical Provider, MD  triamcinolone cream (KENALOG) 0.5 % Apply topically 3 (three) times daily. rash 10/17/11   Aleksei Plotnikov V,  MD   BP 182/75  Pulse 87  Temp(Src) 97.8 F (36.6 C) (Oral)  Resp 14  Ht 5\' 1"  (1.549 m)  Wt 91 lb 3.2 oz (41.368 kg)  BMI 17.24 kg/m2  SpO2 96% Physical Exam  Constitutional: She appears well-developed and well-nourished. No distress.  HENT:  Head: Normocephalic and atraumatic.  Mouth/Throat: Oropharynx is clear and moist.  Eyes: Conjunctivae and EOM are normal. Pupils are equal, round, and reactive to light.  Neck: Normal range of motion. Neck supple.  Cardiovascular: Normal rate, regular rhythm and intact distal pulses.   Pulmonary/Chest: Effort normal and breath sounds normal. She has no wheezes. She has no rales.   Abdominal: Soft. Bowel sounds are normal. There is no tenderness. There is no rebound and no guarding.  Musculoskeletal: Normal range of motion. She exhibits no edema and no tenderness.  Neurological: She is alert. She has normal reflexes. She displays normal reflexes. No cranial nerve deficit. She exhibits normal muscle tone. Coordination normal.  5/5 throughout  Skin: Skin is warm and dry.  Psychiatric: She has a normal mood and affect.    ED Course  Procedures (including critical care time) Labs Review Labs Reviewed  I-STAT CHEM 8, ED    Imaging Review No results found.   EKG Interpretation None      MDM   Final diagnoses:  None    No signs of stroke.  BP is coming down on own without intervention.  Patient is instructed to take BP meds daily and follow up with Dr. Alain Marion.     Carlisle Beers, MD 08/15/13 [phone]

## 2013-08-15 NOTE — ED Notes (Addendum)
Presents with hypertension and "feeling like there is a fullness or something pulling my head" began today. Denies blurred vision, SOB, chest pain, nausea, slurred speech, no droop or drift.  Pt is hypertensive 182/75. Alert, oriented. Takes bp medication but does not take it regularly. Took her bp medication today. She takes norvasc.

## 2013-08-16 ENCOUNTER — Ambulatory Visit: Payer: Medicare Other | Admitting: Internal Medicine

## 2013-08-16 DIAGNOSIS — Z0289 Encounter for other administrative examinations: Secondary | ICD-10-CM

## 2013-08-16 DIAGNOSIS — R4689 Other symptoms and signs involving appearance and behavior: Secondary | ICD-10-CM | POA: Insufficient documentation

## 2013-08-18 ENCOUNTER — Telehealth: Payer: Self-pay | Admitting: Internal Medicine

## 2013-08-18 DIAGNOSIS — E876 Hypokalemia: Secondary | ICD-10-CM

## 2013-08-18 NOTE — Telephone Encounter (Signed)
Notified pt with md response.../lmb 

## 2013-08-18 NOTE — Telephone Encounter (Signed)
Take the 20

## 2013-08-18 NOTE — Telephone Encounter (Signed)
Pt is aware.  

## 2013-08-18 NOTE — Addendum Note (Signed)
Addended by: Earnstine Regal on: 08/18/2013 02:15 PM   Modules accepted: Orders

## 2013-08-18 NOTE — Telephone Encounter (Signed)
Pt called stated that Dr. Camila Li gave her potassium 8 mg to take , but the hospital gave her potassium 20 mg. Pt does not know which one to take because drug store told her to take both. Please advise.

## 2013-08-18 NOTE — Telephone Encounter (Signed)
Take 20 meQ qd & check K+ in 1 week (Code 276.8)

## 2013-08-22 ENCOUNTER — Other Ambulatory Visit (INDEPENDENT_AMBULATORY_CARE_PROVIDER_SITE_OTHER): Payer: Medicare Other

## 2013-08-22 DIAGNOSIS — E876 Hypokalemia: Secondary | ICD-10-CM

## 2013-08-22 LAB — POTASSIUM: POTASSIUM: 3.8 meq/L (ref 3.5–5.1)

## 2013-08-30 ENCOUNTER — Encounter: Payer: Self-pay | Admitting: Internal Medicine

## 2013-08-30 ENCOUNTER — Ambulatory Visit (INDEPENDENT_AMBULATORY_CARE_PROVIDER_SITE_OTHER): Payer: Medicare Other | Admitting: Internal Medicine

## 2013-08-30 VITALS — BP 129/70 | HR 80 | Temp 97.4°F | Wt 92.0 lb

## 2013-08-30 DIAGNOSIS — F329 Major depressive disorder, single episode, unspecified: Secondary | ICD-10-CM

## 2013-08-30 DIAGNOSIS — F3289 Other specified depressive episodes: Secondary | ICD-10-CM

## 2013-08-30 DIAGNOSIS — F411 Generalized anxiety disorder: Secondary | ICD-10-CM

## 2013-08-30 DIAGNOSIS — I10 Essential (primary) hypertension: Secondary | ICD-10-CM

## 2013-08-30 DIAGNOSIS — M545 Low back pain, unspecified: Secondary | ICD-10-CM

## 2013-08-30 DIAGNOSIS — E876 Hypokalemia: Secondary | ICD-10-CM

## 2013-08-30 DIAGNOSIS — F419 Anxiety disorder, unspecified: Secondary | ICD-10-CM

## 2013-08-30 MED ORDER — POTASSIUM CHLORIDE ER 8 MEQ PO TBCR: 8.0000 meq | EXTENDED_RELEASE_TABLET | Freq: Every day | ORAL | Status: DC

## 2013-08-30 NOTE — Progress Notes (Signed)
Pre visit review using our clinic review tool, if applicable. No additional management support is needed unless otherwise documented below in the visit note. 

## 2013-08-30 NOTE — Assessment & Plan Note (Signed)
Continue with current prescription therapy as reflected on the Med list.  

## 2013-08-30 NOTE — Assessment & Plan Note (Signed)
Doing well 

## 2013-08-30 NOTE — Progress Notes (Signed)
   Subjective:    HPI  C/o her head being "stopped up"  F/u abd pain and bloating - resolved.   F/u elev BP ( once it was 200/100) -- no relapse, doing well: using Norvasc only  F/u insomnia - better, no hallucinations  F/u HTN, constipation, GERD, wt loss. She is eating well now   Wt Readings from Last 3 Encounters:  08/30/13 92 lb (41.731 kg)  08/15/13 91 lb 3.2 oz (41.368 kg)  07/19/13 92 lb (41.731 kg)   BP Readings from Last 3 Encounters:  08/30/13 129/70  08/15/13 164/64  07/19/13 128/64     Review of Systems  Constitutional: Negative for activity change, appetite change and unexpected weight change.  HENT: Negative for mouth sores and sinus pressure.   Eyes: Negative for visual disturbance.  Respiratory: Negative for chest tightness.   Gastrointestinal: Negative for abdominal distention.  Genitourinary: Negative for difficulty urinating.  Musculoskeletal: Negative for gait problem.  Skin: Negative for pallor.  Neurological: Negative for tremors.  Psychiatric/Behavioral: Positive for sleep disturbance. Negative for suicidal ideas, behavioral problems, confusion and dysphoric mood. The patient is not nervous/anxious.        Objective:   Physical Exam  Constitutional: She appears well-developed and well-nourished. No distress.  HENT:  Head: Normocephalic.  Right Ear: External ear normal.  Left Ear: External ear normal.  Nose: Nose normal.  Mouth/Throat: Oropharynx is clear and moist.  Eyes: Conjunctivae are normal. Pupils are equal, round, and reactive to light. Right eye exhibits no discharge. Left eye exhibits no discharge.  Neck: Normal range of motion. Neck supple. No JVD present. No tracheal deviation present. No thyromegaly present.  Cardiovascular: Normal rate, regular rhythm and normal heart sounds.   Pulmonary/Chest: No stridor. No respiratory distress. She has no wheezes.  Abdominal: Soft. Bowel sounds are normal. She exhibits no distension and  no mass. There is no tenderness. There is no rebound and no guarding.  Musculoskeletal: She exhibits no edema and no tenderness.  Lymphadenopathy:    She has no cervical adenopathy.  Neurological: She displays normal reflexes. No cranial nerve deficit. She exhibits normal muscle tone. Coordination normal.  Skin: No rash noted. No erythema.  Psychiatric: She has a normal mood and affect. Her behavior is normal. Judgment and thought content normal.  better  a/o/c Wax B  Lab Results  Component Value Date   WBC 5.4 12/28/2010   HGB 16.0* 08/15/2013   HCT 47.0* 08/15/2013   PLT 257 12/28/2010   GLUCOSE 148* 08/15/2013   CHOL 279* 12/20/2009   TRIG 169.0* 12/20/2009   HDL 65.20 12/20/2009   LDLDIRECT 177.6 12/20/2009   ALT 12 12/20/2009   AST 19 12/20/2009   NA 138 08/15/2013   K 3.8 08/22/2013   CL 96 08/15/2013   CREATININE 0.90 08/15/2013   BUN 7 08/15/2013   CO2 33* 05/10/2013   TSH 0.40 07/25/2011         Assessment & Plan:

## 2013-08-30 NOTE — Assessment & Plan Note (Signed)
Klor-con 8 meq/d Labs in 1 mo

## 2013-09-21 ENCOUNTER — Other Ambulatory Visit: Payer: Self-pay | Admitting: Internal Medicine

## 2013-09-21 NOTE — Telephone Encounter (Signed)
Pt called requesting status on her lorazepam.../lmb

## 2013-09-22 NOTE — Telephone Encounter (Signed)
[facility]ified [facility] spoke with natalie/pharmacist gave md approval also notified pt rx has been filled...Lindsey Dickson

## 2013-09-29 ENCOUNTER — Telehealth: Payer: Self-pay | Admitting: *Deleted

## 2013-09-29 ENCOUNTER — Other Ambulatory Visit: Payer: Self-pay | Admitting: Internal Medicine

## 2013-09-29 ENCOUNTER — Other Ambulatory Visit (INDEPENDENT_AMBULATORY_CARE_PROVIDER_SITE_OTHER): Payer: Medicare Other

## 2013-09-29 DIAGNOSIS — E876 Hypokalemia: Secondary | ICD-10-CM

## 2013-09-29 LAB — BASIC METABOLIC PANEL
BUN: 7 mg/dL (ref 6–23)
CHLORIDE: 100 meq/L (ref 96–112)
CO2: 30 meq/L (ref 19–32)
Calcium: 9.3 mg/dL (ref 8.4–10.5)
Creatinine, Ser: 1 mg/dL (ref 0.4–1.2)
GFR: 64.5 mL/min (ref >60.00)
Glucose, Bld: 126 mg/dL — ABNORMAL HIGH (ref 70–99)
POTASSIUM: 3 meq/L — AB (ref 3.5–5.1)
Sodium: 139 mEq/L (ref 135–145)

## 2013-09-29 MED ORDER — POTASSIUM CHLORIDE ER 8 MEQ PO TBCR: 8.0000 meq | EXTENDED_RELEASE_TABLET | Freq: Two times a day (BID) | ORAL | Status: DC

## 2013-09-29 NOTE — Telephone Encounter (Signed)
Pt states md wanted her to have her potassium recheck tomorrow. Wanting to know can she come today because it suppose to rain tomorrow. Inform pt it will be ok.Marland KitchenJohny Chess

## 2013-10-25 ENCOUNTER — Ambulatory Visit: Payer: Medicare Other | Admitting: Internal Medicine

## 2013-10-25 ENCOUNTER — Telehealth: Payer: Self-pay | Admitting: *Deleted

## 2013-10-25 MED ORDER — LORAZEPAM 0.5 MG PO TABS: ORAL_TABLET | ORAL | Status: DC

## 2013-10-25 NOTE — Telephone Encounter (Signed)
Ok - see new rx, pls call in Thx

## 2013-10-25 NOTE — Telephone Encounter (Signed)
Pt states she takes 2 of her lorazepam every night to help her sleep. Sometimes the pills turn to powder and she is running out of medicine and the pharmacy will not refill. Pt is wanting to have the quantity increase...Johny Chess

## 2013-10-26 NOTE — Telephone Encounter (Signed)
[facility]ed [facility] spoke with Lisa/pharmacist gave md response. Notified pt [facility].../lmb

## 2013-11-03 ENCOUNTER — Other Ambulatory Visit: Payer: Self-pay | Admitting: Internal Medicine

## 2013-11-08 ENCOUNTER — Ambulatory Visit: Payer: Medicare Other | Admitting: Internal Medicine

## 2013-11-22 ENCOUNTER — Ambulatory Visit (INDEPENDENT_AMBULATORY_CARE_PROVIDER_SITE_OTHER): Payer: Medicare Other | Admitting: Internal Medicine

## 2013-11-22 ENCOUNTER — Encounter: Payer: Self-pay | Admitting: Internal Medicine

## 2013-11-22 VITALS — BP 136/68 | HR 92 | Temp 97.8°F | Wt 90.8 lb

## 2013-11-22 DIAGNOSIS — R634 Abnormal weight loss: Secondary | ICD-10-CM

## 2013-11-22 DIAGNOSIS — F329 Major depressive disorder, single episode, unspecified: Secondary | ICD-10-CM

## 2013-11-22 DIAGNOSIS — F32A Depression, unspecified: Secondary | ICD-10-CM

## 2013-11-22 DIAGNOSIS — R142 Eructation: Secondary | ICD-10-CM

## 2013-11-22 DIAGNOSIS — F419 Anxiety disorder, unspecified: Secondary | ICD-10-CM

## 2013-11-22 DIAGNOSIS — R141 Gas pain: Secondary | ICD-10-CM

## 2013-11-22 DIAGNOSIS — R143 Flatulence: Secondary | ICD-10-CM

## 2013-11-22 NOTE — Assessment & Plan Note (Signed)
Chronic. She declined councelling On Remeron

## 2013-11-22 NOTE — Progress Notes (Signed)
Pre visit review using our clinic review tool, if applicable. No additional management support is needed unless otherwise documented below in the visit note. 

## 2013-11-22 NOTE — Progress Notes (Signed)
Patient ID: Lindsey Dickson, female   DOB: [birth year], 80 y.o.   MRN: [id number]   Subjective:    HPI    F/u abd pain and bloating.   F/u elev BP ( once it was 200/100) -- no relapse, doing well: using Norvasc only  F/u insomnia - better, no hallucinations  F/u HTN, constipation, GERD, wt loss. She is eating well now...   Wt Readings from Last 3 Encounters:  11/22/13 90 lb 12 oz (41.164 kg)  08/30/13 92 lb (41.731 kg)  08/15/13 91 lb 3.2 oz (41.368 kg)   BP Readings from Last 3 Encounters:  11/22/13 136/68  08/30/13 129/70  08/15/13 164/64     Review of Systems  Constitutional: Negative for activity change, appetite change and unexpected weight change.  HENT: Negative for mouth sores and sinus pressure.   Eyes: Negative for visual disturbance.  Respiratory: Negative for chest tightness.   Gastrointestinal: Negative for abdominal distention.  Genitourinary: Negative for difficulty urinating.  Musculoskeletal: Negative for gait problem.  Skin: Negative for pallor.  Neurological: Negative for tremors.  Psychiatric/Behavioral: Positive for sleep disturbance. Negative for suicidal ideas, behavioral problems, confusion and dysphoric mood. The patient is nervous/anxious.        Objective:   Physical Exam  Constitutional: She appears well-developed. No distress.  Thin Looks well  HENT:  Head: Normocephalic.  Right Ear: External ear normal.  Left Ear: External ear normal.  Nose: Nose normal.  Mouth/Throat: Oropharynx is clear and moist.  Eyes: Conjunctivae are normal. Pupils are equal, round, and reactive to light. Right eye exhibits no discharge. Left eye exhibits no discharge.  Neck: Normal range of motion. Neck supple. No JVD present. No tracheal deviation present. No thyromegaly present.  Cardiovascular: Normal rate, regular rhythm and normal heart sounds.   Pulmonary/Chest: No stridor. No respiratory distress. She has no wheezes.  Abdominal: Soft. Bowel sounds are  normal. She exhibits no distension and no mass. There is no tenderness. There is no rebound and no guarding.  Musculoskeletal: She exhibits no edema or tenderness.  Lymphadenopathy:    She has no cervical adenopathy.  Neurological: She displays normal reflexes. No cranial nerve deficit. She exhibits normal muscle tone. Coordination normal.  Skin: No rash noted. No erythema.  Psychiatric: She has a normal mood and affect. Her behavior is normal. Judgment and thought content normal.  a/o/c   Lab Results  Component Value Date   WBC 5.4 12/28/2010   HGB 16.0* 08/15/2013   HCT 47.0* 08/15/2013   PLT 257 12/28/2010   GLUCOSE 126* 09/29/2013   CHOL 279* 12/20/2009   TRIG 169.0* 12/20/2009   HDL 65.20 12/20/2009   LDLDIRECT 177.6 12/20/2009   ALT 12 12/20/2009   AST 19 12/20/2009   NA 139 09/29/2013   K 3.0* 09/29/2013   CL 100 09/29/2013   CREATININE 1.0 09/29/2013   BUN 7 09/29/2013   CO2 30 09/29/2013   TSH 0.40 07/25/2011         Assessment & Plan:

## 2013-11-22 NOTE — Assessment & Plan Note (Signed)
Continue with current prescription therapy as reflected on the Med list.  

## 2013-11-22 NOTE — Assessment & Plan Note (Signed)
IBS stress related, chronic Continue with current prescription therapy as reflected on the Med list.

## 2013-11-22 NOTE — Assessment & Plan Note (Signed)
Mild  Fluctuating, not a substantial wt loss; good appetite Cont to watch

## 2013-11-30 ENCOUNTER — Other Ambulatory Visit (INDEPENDENT_AMBULATORY_CARE_PROVIDER_SITE_OTHER): Payer: Medicare Other

## 2013-11-30 ENCOUNTER — Ambulatory Visit (INDEPENDENT_AMBULATORY_CARE_PROVIDER_SITE_OTHER): Payer: Medicare Other | Admitting: Internal Medicine

## 2013-11-30 ENCOUNTER — Encounter: Payer: Self-pay | Admitting: Internal Medicine

## 2013-11-30 VITALS — BP 108/58 | HR 92 | Temp 98.1°F | Resp 16 | Ht 61.0 in | Wt 90.4 lb

## 2013-11-30 DIAGNOSIS — I10 Essential (primary) hypertension: Secondary | ICD-10-CM

## 2013-11-30 DIAGNOSIS — M5440 Lumbago with sciatica, unspecified side: Secondary | ICD-10-CM

## 2013-11-30 DIAGNOSIS — E876 Hypokalemia: Secondary | ICD-10-CM

## 2013-11-30 DIAGNOSIS — F419 Anxiety disorder, unspecified: Secondary | ICD-10-CM

## 2013-11-30 LAB — BASIC METABOLIC PANEL
BUN: 14 mg/dL (ref 6–23)
CO2: 38 meq/L — AB (ref 19–32)
Calcium: 9.7 mg/dL (ref 8.4–10.5)
Chloride: 94 mEq/L — ABNORMAL LOW (ref 96–112)
Creatinine, Ser: 1 mg/dL (ref 0.4–1.2)
GFR: 66.71 mL/min (ref >60.00)
GLUCOSE: 139 mg/dL — AB (ref 70–99)
Potassium: 3.2 mEq/L — ABNORMAL LOW (ref 3.5–5.1)
SODIUM: 137 meq/L (ref 135–145)

## 2013-11-30 LAB — URINALYSIS
Bilirubin Urine: NEGATIVE
Hgb urine dipstick: NEGATIVE
Ketones, ur: NEGATIVE
Leukocytes, UA: NEGATIVE
Nitrite: NEGATIVE
Specific Gravity, Urine: 1.015 (ref 1.000–1.030)
TOTAL PROTEIN, URINE-UPE24: NEGATIVE
URINE GLUCOSE: NEGATIVE
Urobilinogen, UA: 0.2 (ref 0.0–1.0)
pH: 7.5 (ref 5.0–8.0)

## 2013-11-30 NOTE — Progress Notes (Signed)
Pre visit review using our clinic review tool, if applicable. No additional management support is needed unless otherwise documented below in the visit note. 

## 2013-11-30 NOTE — Patient Instructions (Signed)
Wt Readings from Last 3 Encounters:  11/30/13 90 lb 6.4 oz (41.005 kg)  11/22/13 90 lb 12 oz (41.164 kg)  08/30/13 92 lb (41.731 kg)    BP Readings from Last 3 Encounters:  11/30/13 108/58  11/22/13 136/68  08/30/13 129/70

## 2013-11-30 NOTE — Assessment & Plan Note (Signed)
Chronic  Potential benefits of a long term benzodiazepines  use as well as potential risks  and complications were explained to the patient and were aknowledged. Continue with current prescription therapy as reflected on the Med list.  

## 2013-11-30 NOTE — Assessment & Plan Note (Signed)
Continue with current prescription therapy as reflected on the Med list. Labs  

## 2013-11-30 NOTE — Progress Notes (Signed)
   Subjective:    HPI      F/u elev BP ( once it was 200/100) -- no relapse, doing well: using Norvasc only  F/u insomnia - better, no hallucinations  F/u HTN, constipation, GERD, wt loss. She is eating well now...   Wt Readings from Last 3 Encounters:  11/30/13 90 lb 6.4 oz (41.005 kg)  11/22/13 90 lb 12 oz (41.164 kg)  08/30/13 92 lb (41.731 kg)   BP Readings from Last 3 Encounters:  11/30/13 108/58  11/22/13 136/68  08/30/13 129/70     Review of Systems  Constitutional: Negative for activity change, appetite change and unexpected weight change.  HENT: Negative for mouth sores and sinus pressure.   Eyes: Negative for visual disturbance.  Respiratory: Negative for chest tightness.   Gastrointestinal: Negative for abdominal distention.  Genitourinary: Negative for difficulty urinating.  Musculoskeletal: Negative for gait problem.  Skin: Negative for pallor.  Neurological: Negative for tremors.  Psychiatric/Behavioral: Positive for sleep disturbance. Negative for suicidal ideas, behavioral problems, confusion and dysphoric mood. The patient is nervous/anxious.        Objective:   Physical Exam  Constitutional: She appears well-developed. No distress.  Thin Looks well  HENT:  Head: Normocephalic.  Right Ear: External ear normal.  Left Ear: External ear normal.  Nose: Nose normal.  Mouth/Throat: Oropharynx is clear and moist.  Eyes: Conjunctivae are normal. Pupils are equal, round, and reactive to light. Right eye exhibits no discharge. Left eye exhibits no discharge.  Neck: Normal range of motion. Neck supple. No JVD present. No tracheal deviation present. No thyromegaly present.  Cardiovascular: Normal rate, regular rhythm and normal heart sounds.   Pulmonary/Chest: No stridor. No respiratory distress. She has no wheezes.  Abdominal: Soft. Bowel sounds are normal. She exhibits no distension and no mass. There is no tenderness. There is no rebound and no  guarding.  Musculoskeletal: She exhibits no edema or tenderness.  Lymphadenopathy:    She has no cervical adenopathy.  Neurological: She displays normal reflexes. No cranial nerve deficit. She exhibits normal muscle tone. Coordination normal.  Skin: No rash noted. No erythema.  Psychiatric: She has a normal mood and affect. Her behavior is normal. Judgment and thought content normal.  a/o/c   Lab Results  Component Value Date   WBC 5.4 12/28/2010   HGB 16.0* 08/15/2013   HCT 47.0* 08/15/2013   PLT 257 12/28/2010   GLUCOSE 126* 09/29/2013   CHOL 279* 12/20/2009   TRIG 169.0* 12/20/2009   HDL 65.20 12/20/2009   LDLDIRECT 177.6 12/20/2009   ALT 12 12/20/2009   AST 19 12/20/2009   NA 139 09/29/2013   K 3.0* 09/29/2013   CL 100 09/29/2013   CREATININE 1.0 09/29/2013   BUN 7 09/29/2013   CO2 30 09/29/2013   TSH 0.40 07/25/2011         Assessment & Plan:

## 2013-11-30 NOTE — Assessment & Plan Note (Signed)
No change - MSK LBP - Tylenol prn

## 2013-12-02 ENCOUNTER — Other Ambulatory Visit: Payer: Self-pay | Admitting: *Deleted

## 2013-12-02 MED ORDER — POTASSIUM CHLORIDE ER 8 MEQ PO TBCR: 8.0000 meq | EXTENDED_RELEASE_TABLET | Freq: Two times a day (BID) | ORAL | Status: DC

## 2013-12-28 ENCOUNTER — Encounter: Payer: Self-pay | Admitting: Internal Medicine

## 2013-12-28 ENCOUNTER — Ambulatory Visit (INDEPENDENT_AMBULATORY_CARE_PROVIDER_SITE_OTHER): Payer: Medicare Other | Admitting: Internal Medicine

## 2013-12-28 VITALS — BP 102/68 | HR 84 | Temp 97.7°F | Ht 61.0 in | Wt 91.0 lb

## 2013-12-28 DIAGNOSIS — R51 Headache: Secondary | ICD-10-CM

## 2013-12-28 DIAGNOSIS — G4451 Hemicrania continua: Secondary | ICD-10-CM

## 2013-12-28 DIAGNOSIS — R519 Headache, unspecified: Secondary | ICD-10-CM | POA: Insufficient documentation

## 2013-12-28 MED ORDER — CEFUROXIME AXETIL 250 MG PO TABS: 250.0000 mg | ORAL_TABLET | Freq: Two times a day (BID) | ORAL | Status: DC

## 2013-12-28 NOTE — Patient Instructions (Signed)
Call me if rash

## 2013-12-28 NOTE — Assessment & Plan Note (Addendum)
12/15 R sided ?sinusitis vs other Call if rash Cetin x 7 d Tylenol prn or Aleve 1 daily prn w/food Call if not better

## 2013-12-28 NOTE — Progress Notes (Signed)
   Subjective:    HPI   C/o R HA/face pain x 3 days, ST   F/u elev BP ( once it was 200/100) -- no relapse, doing well: using Norvasc only  F/u insomnia - better, no hallucinations  F/u HTN, constipation, GERD, wt loss. She is eating well now...   Wt Readings from Last 3 Encounters:  12/28/13 91 lb (41.277 kg)  11/30/13 90 lb 6.4 oz (41.005 kg)  11/22/13 90 lb 12 oz (41.164 kg)   BP Readings from Last 3 Encounters:  12/28/13 102/68  11/30/13 108/58  11/22/13 136/68     Review of Systems  Constitutional: Negative for activity change, appetite change and unexpected weight change.  HENT: Negative for mouth sores and sinus pressure.   Eyes: Negative for visual disturbance.  Respiratory: Negative for chest tightness.   Gastrointestinal: Negative for abdominal distention.  Genitourinary: Negative for difficulty urinating.  Musculoskeletal: Negative for gait problem.  Skin: Negative for pallor.  Neurological: Negative for tremors.  Psychiatric/Behavioral: Positive for sleep disturbance. Negative for suicidal ideas, behavioral problems, confusion and dysphoric mood. The patient is nervous/anxious.        Objective:   Physical Exam  Constitutional: She appears well-developed. No distress.  Thin Looks well  HENT:  Head: Normocephalic.  Right Ear: External ear normal.  Left Ear: External ear normal.  Nose: Nose normal.  Mouth/Throat: Oropharynx is clear and moist.  Eyes: Conjunctivae are normal. Pupils are equal, round, and reactive to light. Right eye exhibits no discharge. Left eye exhibits no discharge.  Neck: Normal range of motion. Neck supple. No JVD present. No tracheal deviation present. No thyromegaly present.  Cardiovascular: Normal rate, regular rhythm and normal heart sounds.   Pulmonary/Chest: No stridor. No respiratory distress. She has no wheezes.  Abdominal: Soft. Bowel sounds are normal. She exhibits no distension and no mass. There is no tenderness.  There is no rebound and no guarding.  Musculoskeletal: She exhibits no edema or tenderness.  Lymphadenopathy:    She has no cervical adenopathy.  Neurological: She displays normal reflexes. No cranial nerve deficit. She exhibits normal muscle tone. Coordination normal.  Skin: No rash noted. No erythema.  Psychiatric: She has a normal mood and affect. Her behavior is normal. Judgment and thought content normal.  a/o/c No rash Scalp is NT Eryth throat   Lab Results  Component Value Date   WBC 5.4 12/28/2010   HGB 16.0* 08/15/2013   HCT 47.0* 08/15/2013   PLT 257 12/28/2010   GLUCOSE 139* 11/30/2013   CHOL 279* 12/20/2009   TRIG 169.0* 12/20/2009   HDL 65.20 12/20/2009   LDLDIRECT 177.6 12/20/2009   ALT 12 12/20/2009   AST 19 12/20/2009   NA 137 11/30/2013   K 3.2* 11/30/2013   CL 94* 11/30/2013   CREATININE 1.0 11/30/2013   BUN 14 11/30/2013   CO2 38* 11/30/2013   TSH 0.40 07/25/2011         Assessment & Plan:

## 2014-01-09 ENCOUNTER — Telehealth: Payer: Self-pay | Admitting: Internal Medicine

## 2014-01-09 NOTE — Telephone Encounter (Signed)
Notified pt with md response.../lmb 

## 2014-01-09 NOTE — Telephone Encounter (Signed)
Pls advise on msg below MD is not in the office this week...Lindsey Dickson

## 2014-01-09 NOTE — Telephone Encounter (Signed)
Stop antibiotic Plain Allegra (NOT D )  160 daily , Loratidine 10 mg , OR Zyrtec 10 mg @ bedtime  as needed for itching

## 2014-01-09 NOTE — Telephone Encounter (Signed)
Pt called in said that Dr Camila Li told her to be sure and call if antibiotics breaks her out.  She said that  she started with a rash on her foot Friday night.  Would should she do?     Best number [phone]

## 2014-02-24 ENCOUNTER — Ambulatory Visit (INDEPENDENT_AMBULATORY_CARE_PROVIDER_SITE_OTHER): Payer: Medicare Other | Admitting: Internal Medicine

## 2014-02-24 ENCOUNTER — Other Ambulatory Visit (INDEPENDENT_AMBULATORY_CARE_PROVIDER_SITE_OTHER): Payer: Medicare Other

## 2014-02-24 VITALS — BP 136/70 | HR 91 | Temp 98.0°F | Wt 93.0 lb

## 2014-02-24 DIAGNOSIS — I10 Essential (primary) hypertension: Secondary | ICD-10-CM

## 2014-02-24 DIAGNOSIS — R634 Abnormal weight loss: Secondary | ICD-10-CM

## 2014-02-24 DIAGNOSIS — E876 Hypokalemia: Secondary | ICD-10-CM | POA: Diagnosis not present

## 2014-02-24 DIAGNOSIS — F419 Anxiety disorder, unspecified: Secondary | ICD-10-CM

## 2014-02-24 LAB — BASIC METABOLIC PANEL
BUN: 14 mg/dL (ref 6–23)
CHLORIDE: 94 meq/L — AB (ref 96–112)
CO2: 37 mEq/L — ABNORMAL HIGH (ref 19–32)
CREATININE: 0.99 mg/dL (ref 0.40–1.20)
Calcium: 9.5 mg/dL (ref 8.4–10.5)
GFR: 67.45 mL/min (ref >60.00)
GLUCOSE: 113 mg/dL — AB (ref 70–99)
POTASSIUM: 3 meq/L — AB (ref 3.5–5.1)
SODIUM: 137 meq/L (ref 135–145)

## 2014-02-24 MED ORDER — AMLODIPINE BESYLATE 5 MG PO TABS: 2.5000 mg | ORAL_TABLET | Freq: Every day | ORAL | Status: DC

## 2014-02-24 MED ORDER — LORAZEPAM 0.5 MG PO TABS: ORAL_TABLET | ORAL | Status: DC

## 2014-02-24 NOTE — Progress Notes (Signed)
   Subjective:    HPI   F/u elev BP ( once it was 200/100) -- no relapse, doing well: using Norvasc only  F/u insomnia - better, no hallucinations  F/u HTN, constipation, GERD, wt loss. She is eating well now...   Wt Readings from Last 3 Encounters:  02/24/14 93 lb (42.185 kg)  12/28/13 91 lb (41.277 kg)  11/30/13 90 lb 6.4 oz (41.005 kg)   BP Readings from Last 3 Encounters:  02/24/14 136/70  12/28/13 102/68  11/30/13 108/58     Review of Systems  Constitutional: Negative for activity change, appetite change and unexpected weight change.  HENT: Negative for mouth sores and sinus pressure.   Eyes: Negative for visual disturbance.  Respiratory: Negative for chest tightness.   Gastrointestinal: Negative for abdominal distention.  Genitourinary: Negative for difficulty urinating.  Musculoskeletal: Negative for gait problem.  Skin: Negative for pallor.  Neurological: Negative for tremors.  Psychiatric/Behavioral: Positive for sleep disturbance. Negative for suicidal ideas, behavioral problems, confusion and dysphoric mood. The patient is nervous/anxious.        Objective:   Physical Exam  Constitutional: She appears well-developed. No distress.  Thin Looks well  HENT:  Head: Normocephalic.  Right Ear: External ear normal.  Left Ear: External ear normal.  Nose: Nose normal.  Mouth/Throat: Oropharynx is clear and moist.  Eyes: Conjunctivae are normal. Pupils are equal, round, and reactive to light. Right eye exhibits no discharge. Left eye exhibits no discharge.  Neck: Normal range of motion. Neck supple. No JVD present. No tracheal deviation present. No thyromegaly present.  Cardiovascular: Normal rate, regular rhythm and normal heart sounds.   Pulmonary/Chest: No stridor. No respiratory distress. She has no wheezes.  Abdominal: Soft. Bowel sounds are normal. She exhibits no distension and no mass. There is no tenderness. There is no rebound and no guarding.   Musculoskeletal: She exhibits no edema or tenderness.  Lymphadenopathy:    She has no cervical adenopathy.  Neurological: She displays normal reflexes. No cranial nerve deficit. She exhibits normal muscle tone. Coordination normal.  Skin: No rash noted. No erythema.  Psychiatric: She has a normal mood and affect. Her behavior is normal. Judgment and thought content normal.  a/o/c No rash Scalp is NT Eryth throat   Lab Results  Component Value Date   WBC 5.4 12/28/2010   HGB 16.0* 08/15/2013   HCT 47.0* 08/15/2013   PLT 257 12/28/2010   GLUCOSE 139* 11/30/2013   CHOL 279* 12/20/2009   TRIG 169.0* 12/20/2009   HDL 65.20 12/20/2009   LDLDIRECT 177.6 12/20/2009   ALT 12 12/20/2009   AST 19 12/20/2009   NA 137 11/30/2013   K 3.2* 11/30/2013   CL 94* 11/30/2013   CREATININE 1.0 11/30/2013   BUN 14 11/30/2013   CO2 38* 11/30/2013   TSH 0.40 07/25/2011         Assessment & Plan:

## 2014-02-24 NOTE — Progress Notes (Signed)
Pre visit review using our clinic review tool, if applicable. No additional management support is needed unless otherwise documented below in the visit note. 

## 2014-02-24 NOTE — Assessment & Plan Note (Signed)
Labs  Continue with current prescription therapy as reflected on the Med list.  

## 2014-02-24 NOTE — Assessment & Plan Note (Signed)
BMET 

## 2014-02-25 NOTE — Assessment & Plan Note (Signed)
Continue with current prn rescription therapy as reflected on the Med list.  Potential benefits of a long term benzodiazepines  use as well as potential risks  and complications were explained to the patient and were aknowledged.

## 2014-02-26 NOTE — Assessment & Plan Note (Signed)
Resolved Wt Readings from Last 3 Encounters:  02/24/14 93 lb (42.185 kg)  12/28/13 91 lb (41.277 kg)  11/30/13 90 lb 6.4 oz (41.005 kg)

## 2014-02-27 ENCOUNTER — Other Ambulatory Visit: Payer: Self-pay | Admitting: Geriatric Medicine

## 2014-04-20 ENCOUNTER — Ambulatory Visit (INDEPENDENT_AMBULATORY_CARE_PROVIDER_SITE_OTHER): Payer: Medicare Other | Admitting: Internal Medicine

## 2014-04-20 ENCOUNTER — Other Ambulatory Visit (INDEPENDENT_AMBULATORY_CARE_PROVIDER_SITE_OTHER): Payer: Medicare Other

## 2014-04-20 ENCOUNTER — Encounter: Payer: Self-pay | Admitting: Internal Medicine

## 2014-04-20 VITALS — BP 130/80 | HR 80 | Temp 98.1°F | Ht 61.0 in | Wt 95.5 lb

## 2014-04-20 DIAGNOSIS — E876 Hypokalemia: Secondary | ICD-10-CM

## 2014-04-20 DIAGNOSIS — R000 Tachycardia, unspecified: Secondary | ICD-10-CM | POA: Diagnosis not present

## 2014-04-20 LAB — TSH: TSH: 0.71 u[IU]/mL (ref 0.35–4.50)

## 2014-04-20 LAB — T4, FREE: FREE T4: 1.03 ng/dL (ref 0.60–1.60)

## 2014-04-20 LAB — POTASSIUM: POTASSIUM: 3.9 meq/L (ref 3.5–5.1)

## 2014-04-20 LAB — MAGNESIUM: Magnesium: 2.3 mg/dL (ref 1.5–2.5)

## 2014-04-20 LAB — CALCIUM: Calcium: 9.4 mg/dL (ref 8.4–10.5)

## 2014-04-20 NOTE — Progress Notes (Signed)
Pre visit review using our clinic review tool, if applicable. No additional management support is needed unless otherwise documented below in the visit note. 

## 2014-04-20 NOTE — Patient Instructions (Signed)
To increase  Potassium (K+) increase citrus fruits & bananas in diet and use the salt substitute No Salt, which contains  potassium , to season food @ the table. Recheck K+ after 4 weeks .   Please keep a diary of frequency of your fast heart rate episodes.  To prevent fast rate, avoid stimulants such as decongestants, diet pills, nicotine, or caffeine (coffee, tea, cola, or chocolate) to excess.

## 2014-04-20 NOTE — Progress Notes (Signed)
   Subjective:    Patient ID: Lindsey Dickson, female    DOB: 1920-09-14, 81 y.o.   MRN: [id number]  HPI She describes intermittent "heart racing".Actually she checks her blood pressure twice a day and intermittently she will notice a fast heart rate registering on the machine. It is difficult to pin her down as to the actual frequency of this phenomenon;but she thinks this occurs approximately 4 times a week.  She has had hypokalemia and was instructed to increase the potassium twice a day. She's not done this as she has some GI symptoms with this.  With these episodes she has some slight lightheadedness. She has no other cardiopulmonary symptoms.  Review of Systems  Chest pain,  exertional dyspnea, paroxysmal nocturnal dyspnea, claudication or edema are absent.  She denies vertigo, syncope, limb weakness, numbness, or tingling.  She has no incontinence of urine or stool. She has no associated blurred vision, double vision, loss of vision. She has fever, chills, or sweats.      Objective:   Physical Exam  Pertinent positive findings include: She's markedly thin.  She appears much younger than her stated age.  She has complete dentures.  Second heart sound is notably increased.  General appearance :adequately nourished; in no distress. Eyes: No conjunctival inflammation or scleral icterus is present. Oral exam:  Lips and gums are healthy appearing.There is no oropharyngeal erythema or exudate noted.  Heart:  Normal rate and regular rhythm. S1 normal without gallop, murmur, click, rub or other extra sounds   Lungs:Chest clear to auscultation; no wheezes, rhonchi,rales ,or rubs present.No increased work of breathing.  Abdomen: bowel sounds normal, soft and non-tender without masses, organomegaly or hernias noted.  No guarding or rebound.  Vascular : all pulses equal ; no bruits present. Skin:Warm & dry.  Intact without suspicious lesions or rashes ; no tenting  Lymphatic: No  lymphadenopathy is noted about the head, neck, axilla Neuro: Strength, tone & DTRs normal.       Assessment & Plan:  #1 intermittent tachycardia as per blood pressure machine.Her EKG is essentially unchanged compared to 12/28/10. She has left anterior fascicular block and poor R-wave progression across the precordium. No  dysrhythmias present.  #2 hypokalemia with noncompliance with recommendations to increase potassium twice a day  Plan: See orders and recommendations. She will  record how often she has events. It may require an event monitor to define this. The risk of hypokalemia was discussed with her.

## 2014-04-24 ENCOUNTER — Telehealth: Payer: Self-pay | Admitting: Internal Medicine

## 2014-04-24 MED ORDER — LORAZEPAM 0.5 MG PO TABS: ORAL_TABLET | ORAL | Status: DC

## 2014-04-24 NOTE — Telephone Encounter (Signed)
OK to fill - see Rx Thx

## 2014-04-24 NOTE — Telephone Encounter (Signed)
Patient states she take 3 tablets of lorazepam.  Patient states she has five pills left.  She tried to get refill at [facility] at [facility].  They will not give her the refill unless Dr. Alain Marion approves.

## 2014-04-25 ENCOUNTER — Telehealth: Payer: Self-pay

## 2014-04-25 NOTE — Telephone Encounter (Signed)
Left message with pharmacy giving approval per dr plotnikov to refill lorazapem

## 2014-05-02 ENCOUNTER — Ambulatory Visit: Payer: Medicare Other | Admitting: Internal Medicine

## 2014-05-18 ENCOUNTER — Other Ambulatory Visit: Payer: Self-pay | Admitting: Internal Medicine

## 2014-05-19 NOTE — Telephone Encounter (Signed)
Patient is now calling about this prescription for LORazepam (ATIVAN) 0.5 MG tablet [[id number]

## 2014-05-19 NOTE — Telephone Encounter (Signed)
Ok to refill?---please advise, thanks

## 2014-05-22 NOTE — Telephone Encounter (Signed)
Called refill into pharmacy spoke witth Lanelle Bal gave md approval.../lmb

## 2014-05-26 ENCOUNTER — Encounter: Payer: Self-pay | Admitting: Internal Medicine

## 2014-05-26 ENCOUNTER — Ambulatory Visit (INDEPENDENT_AMBULATORY_CARE_PROVIDER_SITE_OTHER): Payer: Medicare Other | Admitting: Internal Medicine

## 2014-05-26 VITALS — BP 110/70 | HR 90 | Wt 95.0 lb

## 2014-05-26 DIAGNOSIS — F419 Anxiety disorder, unspecified: Secondary | ICD-10-CM | POA: Diagnosis not present

## 2014-05-26 DIAGNOSIS — I5020 Unspecified systolic (congestive) heart failure: Secondary | ICD-10-CM | POA: Diagnosis not present

## 2014-05-26 DIAGNOSIS — I10 Essential (primary) hypertension: Secondary | ICD-10-CM | POA: Diagnosis not present

## 2014-05-26 DIAGNOSIS — R634 Abnormal weight loss: Secondary | ICD-10-CM | POA: Diagnosis not present

## 2014-05-26 NOTE — Assessment & Plan Note (Signed)
Resolved on Boost

## 2014-05-26 NOTE — Assessment & Plan Note (Signed)
Pt does not want to take Rx, except for amlodipine when BP goes up

## 2014-05-26 NOTE — Progress Notes (Signed)
   Subjective:    HPI   F/u elev BP ( once it was 200/100) -- no relapse, doing well: using Norvasc only  F/u insomnia - better, no hallucinations  F/u HTN, constipation, GERD, wt loss.  She is eating well now...   Wt Readings from Last 3 Encounters:  05/26/14 95 lb (43.092 kg)  04/20/14 95 lb 8 oz (43.319 kg)  02/24/14 93 lb (42.185 kg)   BP Readings from Last 3 Encounters:  05/26/14 110/70  04/20/14 130/80  02/24/14 136/70     Review of Systems  Constitutional: Negative for activity change, appetite change and unexpected weight change.  HENT: Negative for mouth sores and sinus pressure.   Eyes: Negative for visual disturbance.  Respiratory: Negative for chest tightness.   Gastrointestinal: Negative for abdominal distention.  Genitourinary: Negative for difficulty urinating.  Musculoskeletal: Negative for gait problem.  Skin: Negative for pallor.  Neurological: Negative for tremors.  Psychiatric/Behavioral: Positive for sleep disturbance. Negative for suicidal ideas, behavioral problems, confusion and dysphoric mood. The patient is nervous/anxious.        Objective:   Physical Exam  Constitutional: She appears well-developed. No distress.  Thin Looks well  HENT:  Head: Normocephalic.  Right Ear: External ear normal.  Left Ear: External ear normal.  Nose: Nose normal.  Mouth/Throat: Oropharynx is clear and moist.  Eyes: Conjunctivae are normal. Pupils are equal, round, and reactive to light. Right eye exhibits no discharge. Left eye exhibits no discharge.  Neck: Normal range of motion. Neck supple. No JVD present. No tracheal deviation present. No thyromegaly present.  Cardiovascular: Normal rate, regular rhythm and normal heart sounds.   Pulmonary/Chest: No stridor. No respiratory distress. She has no wheezes.  Abdominal: Soft. Bowel sounds are normal. She exhibits no distension and no mass. There is no tenderness. There is no rebound and no guarding.   Musculoskeletal: She exhibits no edema or tenderness.  Lymphadenopathy:    She has no cervical adenopathy.  Neurological: She displays normal reflexes. No cranial nerve deficit. She exhibits normal muscle tone. Coordination normal.  Skin: No rash noted. No erythema.  Psychiatric: She has a normal mood and affect. Her behavior is normal. Judgment and thought content normal.  a/o/c No rash Scalp is NT    Lab Results  Component Value Date   WBC 5.4 12/28/2010   HGB 16.0* 08/15/2013   HCT 47.0* 08/15/2013   PLT 257 12/28/2010   GLUCOSE 113* 02/24/2014   CHOL 279* 12/20/2009   TRIG 169.0* 12/20/2009   HDL 65.20 12/20/2009   LDLDIRECT 177.6 12/20/2009   ALT 12 12/20/2009   AST 19 12/20/2009   NA 137 02/24/2014   K 3.9 04/20/2014   CL 94* 02/24/2014   CREATININE 0.99 02/24/2014   BUN 14 02/24/2014   CO2 37* 02/24/2014   TSH 0.71 04/20/2014         Assessment & Plan:

## 2014-05-26 NOTE — Assessment & Plan Note (Signed)
Chronic Lorazepam prn  Potential benefits of a long term benzodiazepines  use as well as potential risks  and complications were explained to the patient and were aknowledged. 

## 2014-05-26 NOTE — Progress Notes (Signed)
Pre visit review using our clinic review tool, if applicable. No additional management support is needed unless otherwise documented below in the visit note. 

## 2014-05-26 NOTE — Assessment & Plan Note (Signed)
Doing well on NAS diet

## 2014-05-30 ENCOUNTER — Encounter: Payer: Self-pay | Admitting: Internal Medicine

## 2014-06-20 ENCOUNTER — Telehealth: Payer: Self-pay | Admitting: Internal Medicine

## 2014-06-20 NOTE — Telephone Encounter (Signed)
Patient is having some pretty bad pain with her little toe on her right foot and she is having difficulty walking. Before scheduling an appointment she wanted to see if there is anything you can tell her that she can do to help. She stated she did have a blister on that same leg, but it has healed.

## 2014-06-21 NOTE — Telephone Encounter (Signed)
Try Aleve 1 a day w/food. OV if not better Thx

## 2014-06-21 NOTE — Telephone Encounter (Signed)
Pt informed

## 2014-06-26 ENCOUNTER — Telehealth: Payer: Self-pay | Admitting: Internal Medicine

## 2014-06-26 NOTE — Telephone Encounter (Signed)
Patient is needing a refill for LORazepam (ATIVAN) 0.5 MG tablet [[id number]. She's afraid the pharmacy won't let her refill it yet. I tried going over this with her because she should be able to refill it by now and she was having a hard time understanding. Can you please call her to help her figure it out. Please call her after 2pm

## 2014-06-26 NOTE — Telephone Encounter (Signed)
I called pharmacy- pt has picked up # [phone] today. I called pt- I advised her to try to take less if possible due to insurance regulations and increased fall risks/side effects. Pt understands and agrees to try to take less.

## 2014-06-27 ENCOUNTER — Ambulatory Visit: Payer: Medicare Other | Admitting: Podiatry

## 2014-08-01 ENCOUNTER — Ambulatory Visit: Payer: Medicare Other | Admitting: Internal Medicine

## 2014-08-29 ENCOUNTER — Ambulatory Visit (INDEPENDENT_AMBULATORY_CARE_PROVIDER_SITE_OTHER): Payer: Medicare Other | Admitting: Internal Medicine

## 2014-08-29 ENCOUNTER — Telehealth: Payer: Self-pay | Admitting: Internal Medicine

## 2014-08-29 ENCOUNTER — Encounter: Payer: Self-pay | Admitting: Internal Medicine

## 2014-08-29 VITALS — BP 139/80 | HR 83 | Wt 94.0 lb

## 2014-08-29 DIAGNOSIS — E785 Hyperlipidemia, unspecified: Secondary | ICD-10-CM

## 2014-08-29 DIAGNOSIS — R634 Abnormal weight loss: Secondary | ICD-10-CM | POA: Diagnosis not present

## 2014-08-29 DIAGNOSIS — E876 Hypokalemia: Secondary | ICD-10-CM | POA: Diagnosis not present

## 2014-08-29 DIAGNOSIS — I10 Essential (primary) hypertension: Secondary | ICD-10-CM

## 2014-08-29 NOTE — Assessment & Plan Note (Signed)
Chronic On KCl

## 2014-08-29 NOTE — Telephone Encounter (Signed)
Patient stated that she will not make her appt if it is raining. fyi

## 2014-08-29 NOTE — Progress Notes (Signed)
Subjective:  Patient ID: Lindsey Dickson, female    DOB: 05/31/1920  Age: 81 y.o. MRN: [id number]  CC: No chief complaint on file.   HPI Lindsey Dickson presents for HTN, IBS, OA, allergies  Outpatient Prescriptions Prior to Visit  Medication Sig Dispense Refill  . amLODipine (NORVASC) 5 MG tablet Take 0.5 tablets (2.5 mg total) by mouth daily. 90 tablet 3  . Cholecalciferol 1000 UNITS tablet Take 1,000 Units by mouth daily.      [name] loratadine (CLARITIN) 10 MG tablet Take 10 mg by mouth daily as needed for allergies.     [name] LORazepam (ATIVAN) 0.5 MG tablet TAKE 1,2 OR3 TABLETS AT BEDTIME AS NEEDED FOR SLEEP 90 tablet 3  . magnesium hydroxide (MILK OF MAGNESIA) 400 MG/5ML suspension Take 7.5 mLs by mouth daily as needed for mild constipation.     . potassium chloride (KLOR-CON) 8 MEQ tablet Take 1 tablet (8 mEq total) by mouth 2 (two) times daily. 60 tablet 5   No facility-administered medications prior to visit.    ROS Review of Systems  Constitutional: Positive for unexpected weight change. Negative for chills, activity change, appetite change and fatigue.  HENT: Negative for congestion, mouth sores and sinus pressure.   Eyes: Negative for visual disturbance.  Respiratory: Negative for cough and chest tightness.   Gastrointestinal: Negative for nausea and abdominal pain.  Genitourinary: Negative for frequency, difficulty urinating and vaginal pain.  Musculoskeletal: Negative for back pain and gait problem.  Skin: Negative for pallor and rash.  Neurological: Negative for dizziness, tremors, weakness, numbness and headaches.  Psychiatric/Behavioral: Negative for confusion and sleep disturbance.    Objective:  BP 139/80 mmHg  Pulse 83  Wt 94 lb (42.638 kg)  SpO2 95%  BP Readings from Last 3 Encounters:  08/29/14 139/80  05/26/14 110/70  04/20/14 130/80    Wt Readings from Last 3 Encounters:  08/29/14 94 lb (42.638 kg)  05/26/14 95 lb (43.092 kg)  04/20/14 95 lb 8 oz (43.319  kg)    Physical Exam  Constitutional: She appears well-developed. No distress.  HENT:  Head: Normocephalic.  Right Ear: External ear normal.  Left Ear: External ear normal.  Nose: Nose normal.  Mouth/Throat: Oropharynx is clear and moist.  Eyes: Conjunctivae are normal. Pupils are equal, round, and reactive to light. Right eye exhibits no discharge. Left eye exhibits no discharge.  Neck: Normal range of motion. Neck supple. No JVD present. No tracheal deviation present. No thyromegaly present.  Cardiovascular: Normal rate, regular rhythm and normal heart sounds.   Pulmonary/Chest: No stridor. No respiratory distress. She has no wheezes.  Abdominal: Soft. Bowel sounds are normal. She exhibits no distension and no mass. There is no tenderness. There is no rebound and no guarding.  Musculoskeletal: She exhibits no edema or tenderness.  Lymphadenopathy:    She has no cervical adenopathy.  Neurological: She displays normal reflexes. No cranial nerve deficit. She exhibits normal muscle tone. Coordination normal.  Skin: No rash noted. No erythema.  Psychiatric: She has a normal mood and affect. Her behavior is normal. Thought content normal.    Lab Results  Component Value Date   WBC 5.4 12/28/2010   HGB 16.0* 08/15/2013   HCT 47.0* 08/15/2013   PLT 257 12/28/2010   GLUCOSE 113* 02/24/2014   CHOL 279* 12/20/2009   TRIG 169.0* 12/20/2009   HDL 65.20 12/20/2009   LDLDIRECT 177.6 12/20/2009   ALT 12 12/20/2009   AST 19 12/20/2009   NA  137 02/24/2014   K 3.9 04/20/2014   CL 94* 02/24/2014   CREATININE 0.99 02/24/2014   BUN 14 02/24/2014   CO2 37* 02/24/2014   TSH 0.71 04/20/2014    Ct Head Wo Contrast  08/15/2013   CLINICAL DATA:  Hypertension.  Sensation of fullness in the head.  EXAM: CT HEAD WITHOUT CONTRAST  TECHNIQUE: Contiguous axial images were obtained from the base of the skull through the vertex without intravenous contrast.  COMPARISON:  None.  FINDINGS: Diffuse  cerebral atrophy. Mild ventricular dilatation consistent with central atrophy. Low-attenuation changes throughout the deep white matter consistent with small vessel ischemia. No mass effect or midline shift. No abnormal extra-axial fluid collections. Gray-white matter junctions are distinct. Basal cisterns are not effaced. No evidence of acute intracranial hemorrhage. No depressed skull fractures. Visualized paranasal sinuses and mastoid air cells are not opacified.  IMPRESSION: No acute intracranial abnormalities. Chronic atrophy and small vessel ischemic changes.   Electronically Signed   By: Lucienne Capers M.D.   On: 08/15/2013 01:26    Assessment & Plan:   Diagnoses and all orders for this visit:  Essential hypertension  HYPOKALEMIA Orders: -     Basic metabolic panel; Future  Weight loss  Dyslipidemia   I am having Lindsey Dickson maintain her loratadine, magnesium hydroxide, Cholecalciferol, potassium chloride, amLODipine, and LORazepam.  No orders of the defined types were placed in this encounter.     Follow-up: Return in about 4 months (around 12/29/2014) for a follow-up visit.  Walker Kehr, MD

## 2014-08-29 NOTE — Progress Notes (Signed)
Pre visit review using our clinic review tool, if applicable. No additional management support is needed unless otherwise documented below in the visit note. 

## 2014-08-29 NOTE — Assessment & Plan Note (Signed)
  On diet  

## 2014-08-29 NOTE — Assessment & Plan Note (Signed)
Stable

## 2014-08-29 NOTE — Assessment & Plan Note (Signed)
amlodipine when BP goes up

## 2014-09-02 ENCOUNTER — Other Ambulatory Visit: Payer: Self-pay | Admitting: Internal Medicine

## 2014-09-27 ENCOUNTER — Encounter (HOSPITAL_COMMUNITY): Payer: Self-pay | Admitting: Family Medicine

## 2014-09-27 ENCOUNTER — Emergency Department (HOSPITAL_COMMUNITY)
Admission: EM | Admit: 2014-09-27 | Discharge: 2014-09-27 | Disposition: A | Discharge status: Home / Self Care | Payer: Medicare Other | Attending: Emergency Medicine | Admitting: Emergency Medicine

## 2014-09-27 ENCOUNTER — Telehealth: Payer: Self-pay | Admitting: Internal Medicine

## 2014-09-27 DIAGNOSIS — Z8639 Personal history of other endocrine, nutritional and metabolic disease: Secondary | ICD-10-CM | POA: Diagnosis not present

## 2014-09-27 DIAGNOSIS — F329 Major depressive disorder, single episode, unspecified: Secondary | ICD-10-CM | POA: Insufficient documentation

## 2014-09-27 DIAGNOSIS — Z8719 Personal history of other diseases of the digestive system: Secondary | ICD-10-CM | POA: Diagnosis not present

## 2014-09-27 DIAGNOSIS — R51 Headache: Secondary | ICD-10-CM | POA: Diagnosis not present

## 2014-09-27 DIAGNOSIS — Z79899 Other long term (current) drug therapy: Secondary | ICD-10-CM | POA: Insufficient documentation

## 2014-09-27 DIAGNOSIS — I159 Secondary hypertension, unspecified: Secondary | ICD-10-CM | POA: Diagnosis not present

## 2014-09-27 DIAGNOSIS — Z86010 Personal history of colonic polyps: Secondary | ICD-10-CM | POA: Diagnosis not present

## 2014-09-27 DIAGNOSIS — I509 Heart failure, unspecified: Secondary | ICD-10-CM | POA: Diagnosis not present

## 2014-09-27 DIAGNOSIS — Z880 Allergy status to penicillin: Secondary | ICD-10-CM | POA: Diagnosis not present

## 2014-09-27 MED ORDER — ACETAMINOPHEN 500 MG PO TABS
500.0000 mg | ORAL_TABLET | Freq: Once | ORAL | Status: AC
  Administered 2014-09-27: 500 mg via ORAL
  Filled 2014-09-27: qty 1

## 2014-09-27 NOTE — ED Notes (Signed)
Pt here for elevation in her diastolic BP. sts slight headache

## 2014-09-27 NOTE — Discharge Instructions (Signed)
Please follow up with your doctor in 2-3 days for recheck. Return if worsening symptoms.    Hypertension Hypertension, commonly called high blood pressure, is when the force of blood pumping through your arteries is too strong. Your arteries are the blood vessels that carry blood from your heart throughout your body. A blood pressure reading consists of a higher number over a lower number, such as 110/72. The higher number (systolic) is the pressure inside your arteries when your heart pumps. The lower number (diastolic) is the pressure inside your arteries when your heart relaxes. Ideally you want your blood pressure below 120/80. Hypertension forces your heart to work harder to pump blood. Your arteries may become narrow or stiff. Having hypertension puts you at risk for heart disease, stroke, and other problems.  RISK FACTORS Some risk factors for high blood pressure are controllable. Others are not.  Risk factors you cannot control include:   Race. You may be at higher risk if you are African American.  Age. Risk increases with age.  Gender. Men are at higher risk than women before age 47 years. After age 65, women are at higher risk than men. Risk factors you can control include:  Not getting enough exercise or physical activity.  Being overweight.  Getting too much fat, sugar, calories, or salt in your diet.  Drinking too much alcohol. SIGNS AND SYMPTOMS Hypertension does not usually cause signs or symptoms. Extremely high blood pressure (hypertensive crisis) may cause headache, anxiety, shortness of breath, and nosebleed. DIAGNOSIS  To check if you have hypertension, your health care provider will measure your blood pressure while you are seated, with your arm held at the level of your heart. It should be measured at least twice using the same arm. Certain conditions can cause a difference in blood pressure between your right and left arms. A blood pressure reading that is higher  than normal on one occasion does not mean that you need treatment. If one blood pressure reading is high, ask your health care provider about having it checked again. TREATMENT  Treating high blood pressure includes making lifestyle changes and possibly taking medicine. Living a healthy lifestyle can help lower high blood pressure. You may need to change some of your habits. Lifestyle changes may include:  Following the DASH diet. This diet is high in fruits, vegetables, and whole grains. It is low in salt, red meat, and added sugars.  Getting at least 2 hours of brisk physical activity every week.  Losing weight if necessary.  Not smoking.  Limiting alcoholic beverages.  Learning ways to reduce stress. If lifestyle changes are not enough to get your blood pressure under control, your health care provider may prescribe medicine. You may need to take more than one. Work closely with your health care provider to understand the risks and benefits. HOME CARE INSTRUCTIONS  Have your blood pressure rechecked as directed by your health care provider.   Take medicines only as directed by your health care provider. Follow the directions carefully. Blood pressure medicines must be taken as prescribed. The medicine does not work as well when you skip doses. Skipping doses also puts you at risk for problems.   Do not smoke.   Monitor your blood pressure at home as directed by your health care provider. SEEK MEDICAL CARE IF:   You think you are having a reaction to medicines taken.  You have recurrent headaches or feel dizzy.  You have swelling in your ankles.  You  have trouble with your vision. SEEK IMMEDIATE MEDICAL CARE IF:  You develop a severe headache or confusion.  You have unusual weakness, numbness, or feel faint.  You have severe chest or abdominal pain.  You vomit repeatedly.  You have trouble breathing. MAKE SURE YOU:   Understand these instructions.  Will watch  your condition.  Will get help right away if you are not doing well or get worse. Document Released: 01/06/2005 Document Revised: 05/23/2013 Document Reviewed: 10/29/2012 [facility] Patient Information 2015 [place], [place]. This information is not intended to replace advice given to you by your health care provider. Make sure you discuss any questions you have with your health care provider.

## 2014-09-27 NOTE — Telephone Encounter (Signed)
Patient Name: Lindsey Dickson  DOB: 28-Jan-1920    Initial Comment Caller state pulse is 103, feels pretty good, but weak.   Nurse Assessment  Nurse: Mallie Mussel, RN, Alveta Heimlich Date/Time Eilene Ghazi Time): 09/27/2014 9:07:47 AM  Confirm and document reason for call. If symptomatic, describe symptoms. ---Caller states that her pulse was 103 about 20 minutes ago. She states that she feels good but she feels weak. Current heart rate is 105. She denies feeling dizzy and light headed.  Has the patient traveled out of the country within the last 30 days? ---No  Does the patient require triage? ---Yes  Related visit to physician within the last 2 weeks? ---No  Does the PT have any chronic conditions? (i.e. diabetes, asthma, etc.) ---No     Guidelines    Guideline Title Affirmed Question Affirmed Notes  Heart Rate and Heartbeat Questions Dizziness, lightheadedness, or weakness weakness   Final Disposition User   Go to ED Now Mallie Mussel, RN, [facility] - ED   Disagree/Comply: Comply

## 2014-09-27 NOTE — ED Notes (Signed)
Pt is in stable condition upon d/c and is escorted from ED via wheelchair. 

## 2014-09-27 NOTE — ED Provider Notes (Signed)
CSN: [id number]     Arrival date & time 09/27/14  1046 History   First MD Initiated Contact with Patient 09/27/14 1118     Chief Complaint  Patient presents with  . Hypertension  . Headache     (Consider location/radiation/quality/duration/timing/severity/associated sxs/prior Treatment) HPI Lindsey Dickson is a 81 y.o. female with history of depression, hypertension, CHF, presents to emergency department complaining of elevated blood pressure at home this morning. Patient states she checked her blood pressure at home and it was 160/105. She states she rejected and she reports diastolic #[id number]. She called her primary care doctor's office and the nurse on the phone told her to come to emergency department. Patient denies any associated symptoms patient states she has had a mild frontal headache for the last 4-5 days which she attributes to sinuses. She states his headache improves with Tylenol. Nothing is making it better or worse. Onset of headache is gradual. She denies any focal neurological deficits. She denies any chest pain or shortness of breath. She denies any other complaints. She did not take any Edison for her headache this morning.  Past Medical History  Diagnosis Date  . IBS (irritable bowel syndrome)   . Constipation, chronic   . Allergic rhinitis   . CHF (congestive heart failure)   . Depression   . Diverticulitis   . GERD (gastroesophageal reflux disease)   . Hyperlipidemia   . LBP (low back pain)   . Colon polyps   . Hypertension    Past Surgical History  Procedure Laterality Date  . Appendectomy    . Tubal ligation    . Cataract extraction     Family History  Problem Relation Age of Onset  . Hypertension Mother   . Lung cancer Brother   . Cancer Brother     lung   Social History  Substance Use Topics  . Smoking status: Never Smoker   . Smokeless tobacco: None  . Alcohol Use: No   OB History    No data available     Review of Systems  Constitutional:  Negative for fever and chills.  Eyes: Negative for visual disturbance.  Respiratory: Negative for cough, chest tightness and shortness of breath.   Cardiovascular: Negative for chest pain, palpitations and leg swelling.  Gastrointestinal: Negative for nausea, vomiting, abdominal pain and diarrhea.  Genitourinary: Negative for dysuria and flank pain.  Musculoskeletal: Negative for myalgias, arthralgias, neck pain and neck stiffness.  Skin: Negative for rash.  Neurological: Positive for headaches. Negative for dizziness and weakness.  All other systems reviewed and are negative.     Allergies  Ciprofloxacin; Hydrochlorothiazide; Penicillins; and Sulfonamide derivatives  Home Medications   Prior to Admission medications   Medication Sig Start Date End Date Taking? Authorizing Provider  amLODipine (NORVASC) 5 MG tablet Take 0.5 tablets (2.5 mg total) by mouth daily. 02/24/14 02/24/15  Lew Dawes V, MD  Cholecalciferol 1000 UNITS tablet Take 1,000 Units by mouth daily.      Historical Provider, MD  KLOR-CON 8 MEQ tablet TAKE 1 TABLET (8 MEQ TOTAL) BY MOUTH DAILY. 09/04/14   Aleksei Plotnikov V, MD  loratadine (CLARITIN) 10 MG tablet Take 10 mg by mouth daily as needed for allergies.     Historical Provider, MD  LORazepam (ATIVAN) 0.5 MG tablet TAKE 1,2 OR3 TABLETS AT BEDTIME AS NEEDED FOR SLEEP 05/22/14   Aleksei Plotnikov V, MD  magnesium hydroxide (MILK OF MAGNESIA) 400 MG/5ML suspension Take 7.5 mLs by mouth daily as  needed for mild constipation.     Historical Provider, MD  potassium chloride (KLOR-CON) 8 MEQ tablet Take 1 tablet (8 mEq total) by mouth 2 (two) times daily. 12/02/13   Aleksei Plotnikov V, MD   BP 183/62 mmHg  Pulse 80  Temp(Src) 98.2 F (36.8 C)  Resp 18  SpO2 99% Physical Exam  Constitutional: She appears well-developed and well-nourished. No distress.  HENT:  Head: Normocephalic.  Eyes: Conjunctivae and EOM are normal. Pupils are equal, round, and reactive to  light.  Neck: Normal range of motion. Neck supple.  Cardiovascular: Normal rate, regular rhythm and normal heart sounds.   Pulmonary/Chest: Effort normal and breath sounds normal. No respiratory distress. She has no wheezes. She has no rales.  Abdominal: Soft. Bowel sounds are normal. She exhibits no distension. There is no tenderness. There is no rebound.  Musculoskeletal: She exhibits no edema.  Neurological: She is alert.  5/5 and equal upper and lower extremity strength bilaterally. Equal grip strength bilaterally. Normal finger to nose and heel to shin. No pronator drift. Gait is normal  Skin: Skin is warm and dry.  Psychiatric: She has a normal mood and affect. Her behavior is normal.  Nursing note and vitals reviewed.   ED Course  Procedures (including critical care time) Labs Review Labs Reviewed - No data to display  Imaging Review No results found. I have personally reviewed and evaluated these images and lab results as part of my medical decision-making.   EKG Interpretation None      MDM   Final diagnoses:  None   Pt with hx of htn, here for elevated BP of 160/105 at home this morning. Here BP varies. Initially 145/108, las was 138/66. She has no associated symptoms other than mild headache that she has had for several days. Pain improved with tylenol. Normal neurological exam. Doubt intracranial bleed. No evidence of end organ problems from hx. i do not think pt needs lab work or any other tests based on hx and exam. Discussed with Dr. Vanita Panda, who has seen pt, agrees with no tx at this time. Home with close PCP follow up .    Filed Vitals:   09/27/14 1230  BP: 91/65  Pulse: 98  Temp:   Resp:       Jeannett Senior, PA-C 09/27/14 [place], MD 09/27/14 1654

## 2014-09-28 ENCOUNTER — Telehealth: Payer: Self-pay | Admitting: Internal Medicine

## 2014-09-28 NOTE — Telephone Encounter (Signed)
Patient Name: Lindsey Dickson  DOB: 1920-09-17    Initial Comment Caller states her BP is 168/101   Nurse Assessment  Nurse: Raphael Gibney, RN, [place] Date/Time (Eastern Time): 09/28/2014 9:44:24 AM  Confirm and document reason for call. If symptomatic, describe symptoms. ---Caller states she went to ER and they sent her home because everything was normal. BP is 168/101. She takes BP medication when BP is high. BP goes up and down. BP was ok in the ER. She is a little light headed.  Has the patient traveled out of the country within the last 30 days? ---Not Applicable  Does the patient require triage? ---Yes  Related visit to physician within the last 2 weeks? ---No  Does the PT have any chronic conditions? (i.e. diabetes, asthma, etc.) ---No     Guidelines    Guideline Title Affirmed Question Affirmed Notes  High Blood Pressure BP ? 160/100    Final Disposition User   See PCP When Office is Open (within 3 days) Raphael Gibney, RN, Vanita [facility]les    Comments  Pt is scheduled for appt at 8 am on 09/29/2014.   Disagree/Comply: Comply

## 2014-09-29 ENCOUNTER — Ambulatory Visit (INDEPENDENT_AMBULATORY_CARE_PROVIDER_SITE_OTHER): Payer: Medicare Other | Admitting: Internal Medicine

## 2014-09-29 ENCOUNTER — Other Ambulatory Visit (INDEPENDENT_AMBULATORY_CARE_PROVIDER_SITE_OTHER): Payer: Medicare Other

## 2014-09-29 ENCOUNTER — Encounter: Payer: Self-pay | Admitting: Internal Medicine

## 2014-09-29 VITALS — BP 130/85 | HR 89 | Wt 94.0 lb

## 2014-09-29 DIAGNOSIS — I10 Essential (primary) hypertension: Secondary | ICD-10-CM

## 2014-09-29 DIAGNOSIS — F419 Anxiety disorder, unspecified: Secondary | ICD-10-CM

## 2014-09-29 DIAGNOSIS — E876 Hypokalemia: Secondary | ICD-10-CM | POA: Diagnosis not present

## 2014-09-29 DIAGNOSIS — K219 Gastro-esophageal reflux disease without esophagitis: Secondary | ICD-10-CM | POA: Diagnosis not present

## 2014-09-29 LAB — BASIC METABOLIC PANEL
BUN: 11 mg/dL (ref 6–23)
CHLORIDE: 102 meq/L (ref 96–112)
CO2: 33 mEq/L — ABNORMAL HIGH (ref 19–32)
Calcium: 9.4 mg/dL (ref 8.4–10.5)
Creatinine, Ser: 0.98 mg/dL (ref 0.40–1.20)
GFR: 68.16 mL/min (ref >60.00)
Glucose, Bld: 70 mg/dL (ref 70–99)
Potassium: 3.6 mEq/L (ref 3.5–5.1)
Sodium: 142 mEq/L (ref 135–145)

## 2014-09-29 NOTE — Progress Notes (Signed)
Subjective:  Patient ID: Lindsey Dickson, female    DOB: [birth year]  Age: 82 y.o. MRN: [id number]  CC: Hypertension and Dizziness   HPI Lindsey Dickson presents for HTN - not taking all the time, anxiety, allergies. Pt had a cold. F/u ER visit on 9/7 for HTN  Outpatient Prescriptions Prior to Visit  Medication Sig Dispense Refill  . amLODipine (NORVASC) 5 MG tablet Take 0.5 tablets (2.5 mg total) by mouth daily. 90 tablet 3  . Cholecalciferol 1000 UNITS tablet Take 1,000 Units by mouth daily.      [name] KLOR-CON 8 MEQ tablet TAKE 1 TABLET (8 MEQ TOTAL) BY MOUTH DAILY. 30 tablet 11  . loratadine (CLARITIN) 10 MG tablet Take 10 mg by mouth daily as needed for allergies.     [name] LORazepam (ATIVAN) 0.5 MG tablet TAKE 1,2 OR3 TABLETS AT BEDTIME AS NEEDED FOR SLEEP 90 tablet 3  . magnesium hydroxide (MILK OF MAGNESIA) 400 MG/5ML suspension Take 7.5 mLs by mouth daily as needed for mild constipation.     . potassium chloride (KLOR-CON) 8 MEQ tablet Take 1 tablet (8 mEq total) by mouth 2 (two) times daily. 60 tablet 5   No facility-administered medications prior to visit.    ROS Review of Systems  Constitutional: Negative for chills, activity change, appetite change, fatigue and unexpected weight change.  HENT: Negative for congestion, mouth sores and sinus pressure.   Eyes: Negative for visual disturbance.  Respiratory: Negative for cough and chest tightness.   Gastrointestinal: Negative for nausea and abdominal pain.  Genitourinary: Negative for frequency, difficulty urinating and vaginal pain.  Musculoskeletal: Negative for back pain and gait problem.  Skin: Negative for pallor and rash.  Neurological: Negative for dizziness, tremors, weakness, numbness and headaches.  Psychiatric/Behavioral: Negative for suicidal ideas, confusion and sleep disturbance. The patient is nervous/anxious.     Objective:  BP 148/76 mmHg  Pulse 89  Wt 94 lb (42.638 kg)  SpO2 95%  BP Readings from Last 3  Encounters:  09/29/14 148/76  09/27/14 91/65  08/29/14 139/80    Wt Readings from Last 3 Encounters:  09/29/14 94 lb (42.638 kg)  08/29/14 94 lb (42.638 kg)  05/26/14 95 lb (43.092 kg)    Physical Exam  Constitutional: She appears well-developed. No distress.  HENT:  Head: Normocephalic.  Right Ear: External ear normal.  Left Ear: External ear normal.  Nose: Nose normal.  Mouth/Throat: Oropharynx is clear and moist.  Eyes: Conjunctivae are normal. Pupils are equal, round, and reactive to light. Right eye exhibits no discharge. Left eye exhibits no discharge.  Neck: Normal range of motion. Neck supple. No JVD present. No tracheal deviation present. No thyromegaly present.  Cardiovascular: Normal rate, regular rhythm and normal heart sounds.   Pulmonary/Chest: No stridor. No respiratory distress. She has no wheezes.  Abdominal: Soft. Bowel sounds are normal. She exhibits no distension and no mass. There is no tenderness. There is no rebound and no guarding.  Musculoskeletal: She exhibits no edema or tenderness.  Lymphadenopathy:    She has no cervical adenopathy.  Neurological: She displays normal reflexes. No cranial nerve deficit. She exhibits normal muscle tone. Coordination normal.  Skin: No rash noted. No erythema.  Psychiatric: She has a normal mood and affect. Her behavior is normal. Judgment and thought content normal.    Lab Results  Component Value Date   WBC 5.4 12/28/2010   HGB 16.0* 08/15/2013   HCT 47.0* 08/15/2013   PLT 257 12/28/2010  GLUCOSE 113* 02/24/2014   CHOL 279* 12/20/2009   TRIG 169.0* 12/20/2009   HDL 65.20 12/20/2009   LDLDIRECT 177.6 12/20/2009   ALT 12 12/20/2009   AST 19 12/20/2009   NA 137 02/24/2014   K 3.9 04/20/2014   CL 94* 02/24/2014   CREATININE 0.99 02/24/2014   BUN 14 02/24/2014   CO2 37* 02/24/2014   TSH 0.71 04/20/2014    No results found.  Assessment & Plan:   There are no diagnoses linked to this encounter. I am  having Ms. Lopes maintain her loratadine, magnesium hydroxide, Cholecalciferol, potassium chloride, amLODipine, LORazepam, and KLOR-CON.  No orders of the defined types were placed in this encounter.     Follow-up: No Follow-up on file.  Walker Kehr, MD

## 2014-09-29 NOTE — Patient Instructions (Signed)
Take Lorazepam if BP is up and if you have anxiety

## 2014-09-29 NOTE — Assessment & Plan Note (Signed)
Doing ok Take Lorazepam if BP is up/anxiety

## 2014-09-29 NOTE — Progress Notes (Signed)
Pre visit review using our clinic review tool, if applicable. No additional management support is needed unless otherwise documented below in the visit note. 

## 2014-09-30 NOTE — Assessment & Plan Note (Signed)
Try Lorazepam first  if BP is up

## 2014-09-30 NOTE — Assessment & Plan Note (Signed)
Doing well 

## 2014-10-03 ENCOUNTER — Ambulatory Visit (INDEPENDENT_AMBULATORY_CARE_PROVIDER_SITE_OTHER): Payer: Medicare Other | Admitting: Family

## 2014-10-03 ENCOUNTER — Ambulatory Visit: Payer: Medicare Other | Admitting: Internal Medicine

## 2014-10-03 ENCOUNTER — Encounter: Payer: Self-pay | Admitting: Family

## 2014-10-03 VITALS — BP 138/84 | HR 81 | Temp 97.7°F | Resp 18 | Ht 61.0 in | Wt 94.8 lb

## 2014-10-03 DIAGNOSIS — J0110 Acute frontal sinusitis, unspecified: Secondary | ICD-10-CM

## 2014-10-03 DIAGNOSIS — J019 Acute sinusitis, unspecified: Secondary | ICD-10-CM | POA: Insufficient documentation

## 2014-10-03 MED ORDER — DOXYCYCLINE HYCLATE 100 MG PO TABS: 100.0000 mg | ORAL_TABLET | Freq: Two times a day (BID) | ORAL | Status: DC

## 2014-10-03 NOTE — Progress Notes (Signed)
Subjective:    Patient ID: Lindsey Dickson, female    DOB: 29-Nov-1920, 81 y.o.   MRN: [id number]  Chief Complaint  Patient presents with  . Cough    states that she has had a headache, runny nose, watery eyes, productive cough, and drainage, has been having sxs since last week    HPI:  Lindsey Dickson is a 81 y.o. female with a PMH of hypertension, heart failure, GERD, constipation, thyroid nodule, nephrolithiasis, depression, insomnia, and anxiety who presents today for an acute office visit.  This is a new problem. Associated symptoms of headache, runny nose, watery eyes, sinus pressure,  productive cough occasional purulent sputum and drainage coming going on since last week. Modifying factors include a home remedy of lemon juice, onion and honey which did help a little. Indicates that she has progressively worse over the course. Denies recent antibiotic use.     Allergies  Allergen Reactions  . Ciprofloxacin Other (See Comments)    UNKNOWN  . Hydrochlorothiazide     REACTION: low K  . Penicillins Other (See Comments)    UNKNOWN  . Sulfonamide Derivatives Other (See Comments)    UNKNOWN    Current Outpatient Prescriptions on File Prior to Visit  Medication Sig Dispense Refill  . amLODipine (NORVASC) 5 MG tablet Take 0.5 tablets (2.5 mg total) by mouth daily. 90 tablet 3  . Cholecalciferol 1000 UNITS tablet Take 1,000 Units by mouth daily.      [name] KLOR-CON 8 MEQ tablet TAKE 1 TABLET (8 MEQ TOTAL) BY MOUTH DAILY. 30 tablet 11  . loratadine (CLARITIN) 10 MG tablet Take 10 mg by mouth daily as needed for allergies.     [name] LORazepam (ATIVAN) 0.5 MG tablet TAKE 1,2 OR3 TABLETS AT BEDTIME AS NEEDED FOR SLEEP 90 tablet 3  . magnesium hydroxide (MILK OF MAGNESIA) 400 MG/5ML suspension Take 7.5 mLs by mouth daily as needed for mild constipation.     . potassium chloride (KLOR-CON) 8 MEQ tablet Take 1 tablet (8 mEq total) by mouth 2 (two) times daily. 60 tablet 5   No current  facility-administered medications on file prior to visit.    Review of Systems  Constitutional: Negative for fever and chills.  HENT: Positive for congestion, sinus pressure and sore throat.   Eyes: Positive for itching.  Respiratory: Positive for cough. Negative for chest tightness and shortness of breath.   Cardiovascular: Negative for chest pain, palpitations and leg swelling.  Neurological: Positive for headaches.      Objective:    BP 138/84 mmHg  Pulse 81  Temp(Src) 97.7 F (36.5 C) (Oral)  Resp 18  Ht 5\' 1"  (1.549 m)  Wt 94 lb 12.8 oz (43.001 kg)  BMI 17.92 kg/m2  SpO2 98% Nursing note and vital signs reviewed.  Physical Exam  Constitutional: She is oriented to person, place, and time. She appears well-developed and well-nourished. No distress.  HENT:  Right Ear: Hearing, tympanic membrane, external ear and ear canal normal.  Left Ear: Hearing, tympanic membrane, external ear and ear canal normal.  Nose: Right sinus exhibits frontal sinus tenderness. Right sinus exhibits no maxillary sinus tenderness. Left sinus exhibits frontal sinus tenderness. Left sinus exhibits no maxillary sinus tenderness.  Mouth/Throat: Uvula is midline, oropharynx is clear and moist and mucous membranes are normal.  Cardiovascular: Normal rate, regular rhythm, normal heart sounds and intact distal pulses.   Pulmonary/Chest: Effort normal and breath sounds normal.  Neurological: She is alert and oriented to  person, place, and time.  Skin: Skin is warm and dry.  Psychiatric: She has a normal mood and affect. Her behavior is normal. Judgment and thought content normal.       Assessment & Plan:   Problem List Items Addressed This Visit      Respiratory   Sinusitis, acute - Primary    Symptoms and exam consistent with sinusitis and allergic rhinitis. Start doxycycline. Continue current dosage of loratadine. Continue over the counter medications as needed for symptom relief and supportive care.  Follow up if symptoms worsen or fail to improve.       Relevant Medications   doxycycline (VIBRA-TABS) 100 MG tablet

## 2014-10-03 NOTE — Assessment & Plan Note (Signed)
Symptoms and exam consistent with sinusitis and allergic rhinitis. Start doxycycline. Continue current dosage of loratadine. Continue over the counter medications as needed for symptom relief and supportive care. Follow up if symptoms worsen or fail to improve.

## 2014-10-03 NOTE — Patient Instructions (Signed)
Thank you for choosing [facility].  Summary/Instructions:  Your prescription(s) have been submitted to your pharmacy or been printed and provided for you. Please take as directed and contact our office if you believe you are having problem(s) with the medication(s) or have any questions.  If your symptoms worsen or fail to improve, please contact our office for further instruction, or in case of emergency go directly to the emergency room at the closest medical facility.   General Recommendations:    Please drink plenty of fluids.  Get plenty of rest   Sleep in humidified air  Use saline nasal sprays  Netti pot   OTC Medications:  Decongestants - helps relieve congestion   Flonase (generic fluticasone) or Nasacort (generic triamcinolone) - please make sure to use the "cross-over" technique at a 45 degree angle towards the opposite eye as opposed to straight up the nasal passageway.   If you have HIGH BLOOD PRESSURE - Coricidin HBP; AVOID any product that is -D as this contains pseudoephedrine which may increase your blood pressure.  Afrin (oxymetazoline) every 6-8 hours for up to 3 days.   Allergies - helps relieve runny nose, itchy eyes and sneezing   Claritin (generic loratidine), Allegra (fexofenidine), or Zyrtec (generic cyrterizine) for runny nose. These medications should not cause drowsiness.  Note - Benadryl (generic diphenhydramine) may be used however may cause drowsiness  Cough -   Delsym or Robitussin (generic dextromethorphan)  Expectorants - helps loosen mucus to ease removal   Mucinex (generic guaifenesin) as directed on the package.  Headaches / General Aches   Tylenol (generic acetaminophen) - DO NOT EXCEED 3 grams (3,000 mg) in a 24 hour time period  Advil/Motrin (generic ibuprofen)   Sore Throat -   Salt water gargle   Chloraseptic (generic benzocaine) spray or lozenges / Sucrets (generic dyclonine)      Sinusitis Sinusitis  is redness, soreness, and inflammation of the paranasal sinuses. Paranasal sinuses are air pockets within the bones of your face (beneath the eyes, the middle of the forehead, or above the eyes). In healthy paranasal sinuses, mucus is able to drain out, and air is able to circulate through them by way of your nose. However, when your paranasal sinuses are inflamed, mucus and air can become trapped. This can allow bacteria and other germs to grow and cause infection. Sinusitis can develop quickly and last only a short time (acute) or continue over a long period (chronic). Sinusitis that lasts for more than 12 weeks is considered chronic.  CAUSES  Causes of sinusitis include: 3. Allergies. 4. Structural abnormalities, such as displacement of the cartilage that separates your nostrils (deviated septum), which can decrease the air flow through your nose and sinuses and affect sinus drainage. 5. Functional abnormalities, such as when the small hairs (cilia) that line your sinuses and help remove mucus do not work properly or are not present. SIGNS AND SYMPTOMS  Symptoms of acute and chronic sinusitis are the same. The primary symptoms are pain and pressure around the affected sinuses. Other symptoms include: 2. Upper toothache. 3. Earache. 4. Headache. 5. Bad breath. 6. Decreased sense of smell and taste. 7. A cough, which worsens when you are lying flat. 8. Fatigue. 9. Fever. 10. Thick drainage from your nose, which often is green and may contain pus (purulent). 11. Swelling and warmth over the affected sinuses. DIAGNOSIS  Your health care provider will perform a physical exam. During the exam, your health care provider may: 2. Look   in your nose for signs of abnormal growths in your nostrils (nasal polyps). 3. Tap over the affected sinus to check for signs of infection. 4. View the inside of your sinuses (endoscopy) using an imaging device that has a light attached (endoscope). If your health  care provider suspects that you have chronic sinusitis, one or more of the following tests may be recommended: 3. Allergy tests. 4. Nasal culture. A sample of mucus is taken from your nose, sent to a lab, and screened for bacteria. 5. Nasal cytology. A sample of mucus is taken from your nose and examined by your health care provider to determine if your sinusitis is related to an allergy. TREATMENT  Most cases of acute sinusitis are related to a viral infection and will resolve on their own within 10 days. Sometimes medicines are prescribed to help relieve symptoms (pain medicine, decongestants, nasal steroid sprays, or saline sprays).  However, for sinusitis related to a bacterial infection, your health care provider will prescribe antibiotic medicines. These are medicines that will help kill the bacteria causing the infection.  Rarely, sinusitis is caused by a fungal infection. In theses cases, your health care provider will prescribe antifungal medicine. For some cases of chronic sinusitis, surgery is needed. Generally, these are cases in which sinusitis recurs more than 3 times per year, despite other treatments. HOME CARE INSTRUCTIONS  3. Drink plenty of water. Water helps thin the mucus so your sinuses can drain more easily. 4. Use a humidifier. 5. Inhale steam 3 to 4 times a day (for example, sit in the bathroom with the shower running). 6. Apply a warm, moist washcloth to your face 3 to 4 times a day, or as directed by your health care provider. 7. Use saline nasal sprays to help moisten and clean your sinuses. 8. Take medicines only as directed by your health care provider. 9. If you were prescribed either an antibiotic or antifungal medicine, finish it all even if you start to feel better. SEEK IMMEDIATE MEDICAL CARE IF: 6. You have increasing pain or severe headaches. 7. You have nausea, vomiting, or drowsiness. 8. You have swelling around your face. 9. You have vision  problems. 10. You have a stiff neck. 11. You have difficulty breathing. MAKE SURE YOU:   Understand these instructions.  Will watch your condition.  Will get help right away if you are not doing well or get worse. Document Released: 01/06/2005 Document Revised: 05/23/2013 Document Reviewed: 01/21/2011 Exit[employer] Patient Information 2015 [employer]. This information is not intended to replace advice given to you by your health care provider. Make sure you discuss any questions you have with your health care provider.  

## 2014-10-03 NOTE — Progress Notes (Signed)
Pre visit review using our clinic review tool, if applicable. No additional management support is needed unless otherwise documented below in the visit note. 

## 2014-10-11 ENCOUNTER — Telehealth: Payer: Self-pay | Admitting: Internal Medicine

## 2014-10-11 NOTE — Telephone Encounter (Signed)
Do not take them together. Stop potassium and re-start when finished w/abx. Take w/food Thx

## 2014-10-11 NOTE — Telephone Encounter (Signed)
Notified pt with md response.../lmb 

## 2014-10-11 NOTE — Telephone Encounter (Signed)
Pt called in and said that taking the antibiotic and the Potassium together is making her sick.  She wants to know if she should keep taking them??

## 2014-10-25 ENCOUNTER — Other Ambulatory Visit: Payer: Self-pay | Admitting: Internal Medicine

## 2014-10-26 NOTE — Telephone Encounter (Signed)
Called refill into [facility] spoke with Lattie Haw gave md response...Johny Chess

## 2014-11-29 ENCOUNTER — Ambulatory Visit: Payer: Medicare Other | Admitting: Internal Medicine

## 2014-12-08 ENCOUNTER — Other Ambulatory Visit: Payer: Self-pay | Admitting: Internal Medicine

## 2014-12-11 NOTE — Telephone Encounter (Signed)
Called refill into [facility] spoke with Lanelle Bal gave md approval.../lmb

## 2014-12-11 NOTE — Telephone Encounter (Signed)
Done

## 2014-12-27 ENCOUNTER — Encounter: Payer: Self-pay | Admitting: Internal Medicine

## 2014-12-27 ENCOUNTER — Ambulatory Visit (INDEPENDENT_AMBULATORY_CARE_PROVIDER_SITE_OTHER): Payer: Medicare Other | Admitting: Internal Medicine

## 2014-12-27 VITALS — BP 144/70 | HR 76 | Wt 92.0 lb

## 2014-12-27 DIAGNOSIS — F419 Anxiety disorder, unspecified: Secondary | ICD-10-CM

## 2014-12-27 DIAGNOSIS — G8929 Other chronic pain: Secondary | ICD-10-CM

## 2014-12-27 DIAGNOSIS — E785 Hyperlipidemia, unspecified: Secondary | ICD-10-CM

## 2014-12-27 DIAGNOSIS — R634 Abnormal weight loss: Secondary | ICD-10-CM

## 2014-12-27 DIAGNOSIS — I10 Essential (primary) hypertension: Secondary | ICD-10-CM | POA: Diagnosis not present

## 2014-12-27 DIAGNOSIS — M5440 Lumbago with sciatica, unspecified side: Secondary | ICD-10-CM

## 2014-12-27 DIAGNOSIS — K5901 Slow transit constipation: Secondary | ICD-10-CM | POA: Diagnosis not present

## 2014-12-27 NOTE — Progress Notes (Signed)
Pre visit review using our clinic review tool, if applicable. No additional management support is needed unless otherwise documented below in the visit note. 

## 2014-12-27 NOTE — Assessment & Plan Note (Signed)
Off Rx now Monitor BP NAS diet

## 2014-12-27 NOTE — Assessment & Plan Note (Signed)
Monitoring wt

## 2014-12-27 NOTE — Assessment & Plan Note (Signed)
Declined statins. 

## 2014-12-27 NOTE — Assessment & Plan Note (Signed)
Chronic  MOM 2-3/week

## 2014-12-27 NOTE — Assessment & Plan Note (Signed)
Chronic Lorazepam prn  Potential benefits of a long term benzodiazepines  use as well as potential risks  and complications were explained to the patient and were aknowledged. 

## 2014-12-27 NOTE — Assessment & Plan Note (Signed)
Tylenol prn 

## 2014-12-27 NOTE — Progress Notes (Signed)
Subjective:  Patient ID: Lindsey Dickson, female    DOB: 14-Nov-1920  Age: 81 y.o. MRN: [id number]  CC: No chief complaint on file.   HPI Lindsey Dickson presents for HTN 9pt stopped amlodipine), anxiety, OA f/u.  Outpatient Prescriptions Prior to Visit  Medication Sig Dispense Refill  . Cholecalciferol 1000 UNITS tablet Take 1,000 Units by mouth daily.      [name] loratadine (CLARITIN) 10 MG tablet Take 10 mg by mouth daily as needed for allergies.     [name] LORazepam (ATIVAN) 0.5 MG tablet TAKE 1,2 OR 3 TABLETS AT BEDTIME AS NEEDED FOR SLEEP 90 tablet 2  . magnesium hydroxide (MILK OF MAGNESIA) 400 MG/5ML suspension Take 7.5 mLs by mouth daily as needed for mild constipation.     [name] doxycycline (VIBRA-TABS) 100 MG tablet Take 1 tablet (100 mg total) by mouth 2 (two) times daily. 20 tablet 0  . KLOR-CON 8 MEQ tablet TAKE 1 TABLET (8 MEQ TOTAL) BY MOUTH DAILY. 30 tablet 11  . potassium chloride (KLOR-CON) 8 MEQ tablet Take 1 tablet (8 mEq total) by mouth 2 (two) times daily. 60 tablet 5  . amLODipine (NORVASC) 5 MG tablet Take 0.5 tablets (2.5 mg total) by mouth daily. (Patient not taking: Reported on 12/27/2014) 90 tablet 3   No facility-administered medications prior to visit.    ROS Review of Systems  Constitutional: Positive for fatigue. Negative for chills, activity change, appetite change and unexpected weight change.  HENT: Negative for congestion, mouth sores and sinus pressure.   Eyes: Negative for visual disturbance.  Respiratory: Negative for cough and chest tightness.   Gastrointestinal: Negative for nausea and abdominal pain.  Genitourinary: Negative for frequency, difficulty urinating and vaginal pain.  Musculoskeletal: Negative for back pain and gait problem.  Skin: Negative for pallor and rash.  Neurological: Negative for dizziness, tremors, weakness, numbness and headaches.  Psychiatric/Behavioral: Positive for sleep disturbance. Negative for confusion. The patient is  nervous/anxious.     Objective:  BP 144/70 mmHg  Pulse 76  Wt 92 lb (41.731 kg)  SpO2 98%  BP Readings from Last 3 Encounters:  12/27/14 144/70  10/03/14 138/84  09/29/14 130/85    Wt Readings from Last 3 Encounters:  12/27/14 92 lb (41.731 kg)  10/03/14 94 lb 12.8 oz (43.001 kg)  09/29/14 94 lb (42.638 kg)    Physical Exam  Constitutional: She appears well-developed. No distress.  HENT:  Head: Normocephalic.  Right Ear: External ear normal.  Left Ear: External ear normal.  Nose: Nose normal.  Mouth/Throat: Oropharynx is clear and moist.  Eyes: Conjunctivae are normal. Pupils are equal, round, and reactive to light. Right eye exhibits no discharge. Left eye exhibits no discharge.  Neck: Normal range of motion. Neck supple. No JVD present. No tracheal deviation present. No thyromegaly present.  Cardiovascular: Normal rate, regular rhythm and normal heart sounds.   Pulmonary/Chest: No stridor. No respiratory distress. She has no wheezes.  Abdominal: Soft. Bowel sounds are normal. She exhibits no distension and no mass. There is no tenderness. There is no rebound and no guarding.  Musculoskeletal: She exhibits no edema or tenderness.  Lymphadenopathy:    She has no cervical adenopathy.  Neurological: She displays normal reflexes. No cranial nerve deficit. She exhibits normal muscle tone. Coordination normal.  Skin: No rash noted. No erythema.  Psychiatric: She has a normal mood and affect. Her behavior is normal. Judgment and thought content normal.    Lab Results  Component Value Date  WBC 5.4 12/28/2010   HGB 16.0* 08/15/2013   HCT 47.0* 08/15/2013   PLT 257 12/28/2010   GLUCOSE 70 09/29/2014   CHOL 279* 12/20/2009   TRIG 169.0* 12/20/2009   HDL 65.20 12/20/2009   LDLDIRECT 177.6 12/20/2009   ALT 12 12/20/2009   AST 19 12/20/2009   NA 142 09/29/2014   K 3.6 09/29/2014   CL 102 09/29/2014   CREATININE 0.98 09/29/2014   BUN 11 09/29/2014   CO2 33* 09/29/2014     TSH 0.71 04/20/2014    No results found.  Assessment & Plan:   Diagnoses and all orders for this visit:  Essential hypertension -     Lipid panel; Future -     CBC with Differential/Platelet; Future -     Basic metabolic panel; Future -     TSH; Future -     Urinalysis; Future -     Hepatic function panel; Future  Slow transit constipation -     Lipid panel; Future -     CBC with Differential/Platelet; Future -     Basic metabolic panel; Future -     TSH; Future -     Urinalysis; Future -     Hepatic function panel; Future  Anxiety -     Lipid panel; Future -     CBC with Differential/Platelet; Future -     Basic metabolic panel; Future -     TSH; Future -     Urinalysis; Future -     Hepatic function panel; Future  Dyslipidemia -     Lipid panel; Future -     CBC with Differential/Platelet; Future -     Basic metabolic panel; Future -     TSH; Future -     Urinalysis; Future -     Hepatic function panel; Future  Chronic midline low back pain with sciatica, sciatica laterality unspecified -     Lipid panel; Future -     CBC with Differential/Platelet; Future -     Basic metabolic panel; Future -     TSH; Future -     Urinalysis; Future -     Hepatic function panel; Future  Weight loss -     Lipid panel; Future -     CBC with Differential/Platelet; Future -     Basic metabolic panel; Future -     TSH; Future -     Urinalysis; Future -     Hepatic function panel; Future  I have discontinued Lindsey Dickson's potassium chloride, amLODipine, KLOR-CON, and doxycycline. I am also having her maintain her loratadine, magnesium hydroxide, Cholecalciferol, and LORazepam.  No orders of the defined types were placed in this encounter.     Follow-up: Return in about 3 months (around 03/27/2015) for Wellness Exam.  Walker Kehr, MD

## 2015-02-26 DIAGNOSIS — Z961 Presence of intraocular lens: Secondary | ICD-10-CM | POA: Diagnosis not present

## 2015-02-26 DIAGNOSIS — H04123 Dry eye syndrome of bilateral lacrimal glands: Secondary | ICD-10-CM | POA: Diagnosis not present

## 2015-03-08 ENCOUNTER — Other Ambulatory Visit: Payer: Self-pay | Admitting: Internal Medicine

## 2015-03-12 ENCOUNTER — Telehealth: Payer: Self-pay | Admitting: Internal Medicine

## 2015-03-12 NOTE — Telephone Encounter (Signed)
I called pharmacy- The Klor Con 8 meq has been prescribed for years. She recently p/u a refill on 02/18/2015.

## 2015-03-12 NOTE — Telephone Encounter (Signed)
Pt called in and that pharmacy did not have the refill for the LORazepam (ATIVAN) 0.5 MG tablet [id number]  .  I verified that it was the correct pharmacy

## 2015-03-12 NOTE — Telephone Encounter (Signed)
I called pharmacy- pt p/u Lorazepam earlier today.

## 2015-03-12 NOTE — Telephone Encounter (Signed)
Patient states she was prescribed klor con.  Patient would like to know why this was prescribed and what it is for.

## 2015-03-12 NOTE — Telephone Encounter (Signed)
Done

## 2015-03-13 ENCOUNTER — Emergency Department (INDEPENDENT_AMBULATORY_CARE_PROVIDER_SITE_OTHER): Payer: Medicare Other

## 2015-03-13 ENCOUNTER — Emergency Department (HOSPITAL_COMMUNITY)
Admission: EM | Admit: 2015-03-13 | Discharge: 2015-03-13 | Disposition: A | Discharge status: Home / Self Care | Payer: Medicare Other | Source: Home / Self Care | Attending: Family Medicine | Admitting: Family Medicine

## 2015-03-13 ENCOUNTER — Encounter (HOSPITAL_COMMUNITY): Payer: Self-pay | Admitting: *Deleted

## 2015-03-13 DIAGNOSIS — J069 Acute upper respiratory infection, unspecified: Secondary | ICD-10-CM

## 2015-03-13 DIAGNOSIS — R05 Cough: Secondary | ICD-10-CM | POA: Diagnosis not present

## 2015-03-13 DIAGNOSIS — R509 Fever, unspecified: Secondary | ICD-10-CM | POA: Diagnosis not present

## 2015-03-13 MED ORDER — IPRATROPIUM BROMIDE 0.06 % NA SOLN: 2.0000 | Freq: Four times a day (QID) | NASAL | Status: DC

## 2015-03-13 NOTE — ED Provider Notes (Signed)
CSN: [id number]     Arrival date & time 03/13/15  1411 History   None    Chief Complaint  Patient presents with  . Headache   (Consider location/radiation/quality/duration/timing/severity/associated sxs/prior Treatment) Patient is a 82 y.o. female presenting with headaches. The history is provided by the patient and a relative.  Headache Pain location:  L parietal Quality:  Sharp Radiates to:  Does not radiate Onset quality:  Gradual Duration:  2 days Chronicity:  New Similar to prior headaches: no   Context: coughing   Ineffective treatments:  None tried Associated symptoms: congestion, cough, fever and neck pain   Associated symptoms: no blurred vision, no dizziness, no loss of balance, no nausea, no neck stiffness, no sore throat, no vomiting and no weakness     Past Medical History  Diagnosis Date  . IBS (irritable bowel syndrome)   . Constipation, chronic   . Allergic rhinitis   . CHF (congestive heart failure) ([place])   . Depression   . Diverticulitis   . GERD (gastroesophageal reflux disease)   . Hyperlipidemia   . LBP (low back pain)   . Colon polyps   . Hypertension    Past Surgical History  Procedure Laterality Date  . Appendectomy    . Tubal ligation    . Cataract extraction     Family History  Problem Relation Age of Onset  . Hypertension Mother   . Lung cancer Brother   . Cancer Brother     lung   Social History  Substance Use Topics  . Smoking status: Never Smoker   . Smokeless tobacco: None  . Alcohol Use: No   OB History    No data available     Review of Systems  Constitutional: Positive for fever.  HENT: Positive for congestion. Negative for sore throat.   Eyes: Negative for blurred vision.  Respiratory: Positive for cough. Negative for shortness of breath and wheezing.   Cardiovascular: Negative.   Gastrointestinal: Negative.  Negative for nausea and vomiting.  Genitourinary: Negative.   Musculoskeletal: Positive for neck pain.  Negative for neck stiffness.  Neurological: Positive for headaches. Negative for dizziness, weakness and loss of balance.  All other systems reviewed and are negative.   Allergies  Ciprofloxacin; Hydrochlorothiazide; Penicillins; and Sulfonamide derivatives  Home Medications   Prior to Admission medications   Medication Sig Start Date End Date Taking? Authorizing Provider  Cholecalciferol 1000 UNITS tablet Take 1,000 Units by mouth daily.      Historical Provider, MD  ipratropium (ATROVENT) 0.06 % nasal spray Place 2 sprays into both nostrils 4 (four) times daily. 03/13/15   Billy Fischer, MD  loratadine (CLARITIN) 10 MG tablet Take 10 mg by mouth daily as needed for allergies.     Historical Provider, MD  LORazepam (ATIVAN) 0.5 MG tablet TAKE 1 OR 2 OR 3 TABLETS AT BEDTIME AS NEEDED FOR SLEEP 03/10/15   Aleksei Plotnikov V, MD  magnesium hydroxide (MILK OF MAGNESIA) 400 MG/5ML suspension Take 7.5 mLs by mouth daily as needed for mild constipation.     Historical Provider, MD   Meds Ordered and Administered this Visit  Medications - No data to display  BP 126/84 mmHg  Pulse 82  Temp(Src) 99 F (37.2 C) (Oral)  Resp 18  SpO2 97% No data found.   Physical Exam  Constitutional: She is oriented to person, place, and time. She appears well-developed and well-nourished. No distress.  HENT:  Right Ear: External ear normal.  Left  Ear: External ear normal.  Mouth/Throat: Oropharynx is clear and moist.  Neck: Normal range of motion. Neck supple.  Cardiovascular: Normal rate, regular rhythm and normal heart sounds.   Pulmonary/Chest: Effort normal and breath sounds normal.  Abdominal: Soft. Bowel sounds are normal. There is no tenderness.  Lymphadenopathy:    She has no cervical adenopathy.  Neurological: She is alert and oriented to person, place, and time.  Skin: Skin is warm and dry.  Nursing note and vitals reviewed.   ED Course  Procedures (including critical care  time)  Labs Review Labs Reviewed - No data to display  Imaging Review Dg Chest 2 View  03/13/2015  CLINICAL DATA:  Cough, fever. EXAM: CHEST  2 VIEW COMPARISON:  None. FINDINGS: Hyperinflation of the lungs. Heart and mediastinal contours are within normal limits. No focal opacities or effusions. No acute bony abnormality. IMPRESSION: Hyperinflation.  No active disease. Electronically Signed   By: Rolm Baptise M.D.   On: 03/13/2015 15:00   X-rays reviewed and report per radiologist.   Visual Acuity Review  Right Eye Distance:   Left Eye Distance:   Bilateral Distance:    Right Eye Near:   Left Eye Near:    Bilateral Near:         MDM   1. URI (upper respiratory infection)    Meds ordered this encounter  Medications  . ipratropium (ATROVENT) 0.06 % nasal spray    Sig: Place 2 sprays into both nostrils 4 (four) times daily.    Dispense:  15 mL    Refill:  1       Billy Fischer, MD 03/13/15 [phone]

## 2015-03-13 NOTE — ED Notes (Signed)
Pt  Reports  Headache       Chest   Congestion          Cough   With      Symptoms  Since  Yesterday      pt    Had  Fever  Earlier  Today        Caregiver  At  Bedside       Pt  Appears  In no  Acute  Distress

## 2015-03-13 NOTE — ED Notes (Signed)
Pain from back of head radiating to top of head

## 2015-03-13 NOTE — Discharge Instructions (Signed)
Drink plenty of fluids as discussed, use medicine as prescribed, and mucinex or delsym for cough. Return or see your doctor if further problems °

## 2015-03-21 ENCOUNTER — Encounter: Payer: Medicare Other | Admitting: Internal Medicine

## 2015-03-26 ENCOUNTER — Encounter: Payer: Medicare Other | Admitting: Internal Medicine

## 2015-03-27 ENCOUNTER — Ambulatory Visit: Payer: Medicare Other | Admitting: Internal Medicine

## 2015-04-04 ENCOUNTER — Ambulatory Visit: Payer: Medicare Other | Admitting: Nurse Practitioner

## 2015-04-17 ENCOUNTER — Other Ambulatory Visit (INDEPENDENT_AMBULATORY_CARE_PROVIDER_SITE_OTHER): Payer: Medicare Other

## 2015-04-17 ENCOUNTER — Ambulatory Visit (INDEPENDENT_AMBULATORY_CARE_PROVIDER_SITE_OTHER): Payer: Medicare Other | Admitting: Internal Medicine

## 2015-04-17 ENCOUNTER — Encounter: Payer: Self-pay | Admitting: Internal Medicine

## 2015-04-17 VITALS — BP 140/68 | HR 76 | Ht 61.0 in | Wt 91.0 lb

## 2015-04-17 DIAGNOSIS — R634 Abnormal weight loss: Secondary | ICD-10-CM

## 2015-04-17 DIAGNOSIS — M5440 Lumbago with sciatica, unspecified side: Secondary | ICD-10-CM

## 2015-04-17 DIAGNOSIS — G8929 Other chronic pain: Secondary | ICD-10-CM

## 2015-04-17 DIAGNOSIS — F419 Anxiety disorder, unspecified: Secondary | ICD-10-CM | POA: Diagnosis not present

## 2015-04-17 DIAGNOSIS — I10 Essential (primary) hypertension: Secondary | ICD-10-CM | POA: Diagnosis not present

## 2015-04-17 DIAGNOSIS — Z0000 Encounter for general adult medical examination without abnormal findings: Secondary | ICD-10-CM | POA: Diagnosis not present

## 2015-04-17 DIAGNOSIS — E785 Hyperlipidemia, unspecified: Secondary | ICD-10-CM

## 2015-04-17 DIAGNOSIS — K5901 Slow transit constipation: Secondary | ICD-10-CM | POA: Diagnosis not present

## 2015-04-17 LAB — BASIC METABOLIC PANEL
BUN: 7 mg/dL (ref 6–23)
CHLORIDE: 102 meq/L (ref 96–112)
CO2: 32 meq/L (ref 19–32)
CREATININE: 0.95 mg/dL (ref 0.40–1.20)
Calcium: 9.4 mg/dL (ref 8.4–10.5)
GFR: 70.57 mL/min (ref >60.00)
GLUCOSE: 96 mg/dL (ref 70–99)
Potassium: 4.1 mEq/L (ref 3.5–5.1)
Sodium: 140 mEq/L (ref 135–145)

## 2015-04-17 LAB — CBC WITH DIFFERENTIAL/PLATELET
BASOS ABS: 0 10*3/uL (ref 0.0–0.1)
Basophils Relative: 0.7 % (ref 0.0–3.0)
Eosinophils Absolute: 0.3 10*3/uL (ref 0.0–0.7)
Eosinophils Relative: 5.4 % — ABNORMAL HIGH (ref 0.0–5.0)
HEMATOCRIT: 39.9 % (ref 36.0–46.0)
HEMOGLOBIN: 13.4 g/dL (ref 12.0–15.0)
LYMPHS PCT: 37 % (ref 12.0–46.0)
Lymphs Abs: 2.2 10*3/uL (ref 0.7–4.0)
MCHC: 33.5 g/dL (ref 30.0–36.0)
MCV: 84.9 fl (ref 78.0–100.0)
MONOS PCT: 12.5 % — AB (ref 3.0–12.0)
Monocytes Absolute: 0.7 10*3/uL (ref 0.1–1.0)
NEUTROS ABS: 2.6 10*3/uL (ref 1.4–7.7)
Neutrophils Relative %: 44.4 % (ref 43.0–77.0)
PLATELETS: 263 10*3/uL (ref 150.0–400.0)
RBC: 4.7 Mil/uL (ref 3.87–5.11)
RDW: 13.5 % (ref 11.5–15.5)
WBC: 5.8 10*3/uL (ref 4.0–10.5)

## 2015-04-17 LAB — LIPID PANEL
CHOL/HDL RATIO: 4
CHOLESTEROL: 229 mg/dL — AB (ref 0–200)
HDL: 52.1 mg/dL (ref >39.00)
LDL Cholesterol: 145 mg/dL — ABNORMAL HIGH (ref 0–99)
NonHDL: 176.79
TRIGLYCERIDES: 161 mg/dL — AB (ref 0.0–149.0)
VLDL: 32.2 mg/dL (ref 0.0–40.0)

## 2015-04-17 LAB — HEPATIC FUNCTION PANEL
ALT: 8 U/L (ref 0–35)
AST: 17 U/L (ref 0–37)
Albumin: 3.4 g/dL — ABNORMAL LOW (ref 3.5–5.2)
Alkaline Phosphatase: 76 U/L (ref 39–117)
BILIRUBIN DIRECT: 0.1 mg/dL (ref 0.0–0.3)
BILIRUBIN TOTAL: 0.4 mg/dL (ref 0.2–1.2)
Total Protein: 7.4 g/dL (ref 6.0–8.3)

## 2015-04-17 LAB — TSH: TSH: 0.81 u[IU]/mL (ref 0.35–4.50)

## 2015-04-17 NOTE — Patient Instructions (Addendum)
Lindsey Dickson , Thank you for taking time to come for your Medicare Wellness Visit. I appreciate your ongoing commitment to your health goals. Please review the following plan we discussed and let me know if I can assist you in the future.   Need to schedule eye exam.  The doctor said eat what you want;   Put 3 things on your plate every meal  Eat snack every night   Drink 2 ensure  per day   Lifeline: http://[email or web address]/content/home; [phone]   These are the goals we discussed: Goals    . Gain weight     Will drink at least 2 ensures a day;  Will eat more nuts, ice cream;        This is a list of the screening recommended for you and due dates:  Health Maintenance  Topic Date Due  . Tetanus Vaccine  01/23/1939  . Shingles Vaccine  01/23/1982  . DEXA scan (bone density measurement)  01/24/1987  . Pneumonia vaccines (1 of 2 - PCV13) 01/24/1987  . Flu Shot  04/20/2015*  *Topic was postponed. The date shown is not the original due date.      Fall Prevention in Hospitals, Adult As a hospital patient, your condition and the treatments you receive can increase your risk for falls. Some additional risk factors for falls in a hospital include:  Being in an unfamiliar environment.  Being on bed rest.  Your surgery.  Taking certain medicines.  Your tubing requirements, such as intravenous (IV) therapy or catheters. It is important that you learn how to decrease fall risks while at the hospital. Below are important tips that can help prevent falls. SAFETY TIPS FOR PREVENTING FALLS Talk about your risk of falling.  Ask your health care provider why you are at risk for falling. Is it your medicine, illness, tubing placement, or something else?  Make a plan with your health care provider to keep you safe from falls.  Ask your health care provider or pharmacist about side effects of your medicines. Some medicines can make you dizzy or affect your  coordination. Ask for help.  Ask for help before getting out of bed. You may need to press your call button.  Ask for assistance in getting safely to the toilet.  Ask for a walker or cane to be put at your bedside. Ask that most of the side rails on your bed be placed up before your health care provider leaves the room.  Ask family or friends to sit with you.  Ask for things that are out of your reach, such as your glasses, hearing aids, telephone, bedside table, or call button. Follow these tips to avoid falling:  Stay lying or seated, rather than standing, while waiting for help.  Wear rubber-soled slippers or shoes whenever you walk in the hospital.  Avoid quick, sudden movements.  Change positions slowly.  Sit on the side of your bed before standing.  Stand up slowly and wait before you start to walk.  Let your health care provider know if there is a spill on the floor.  Pay careful attention to the medical equipment, electrical cords, and tubes around you.  When you need help, use your call button by your bed or in the bathroom. Wait for one of your health care providers to help you.  If you feel dizzy or unsure of your footing, return to bed and wait for assistance.  Avoid being distracted by the TV, telephone, or another  person in your room.  Do not lean or support yourself on rolling objects, such as IV poles or bedside tables.   This information is not intended to replace advice given to you by your health care provider. Make sure you discuss any questions you have with your health care provider.   Document Released: 01/04/2000 Document Revised: 01/27/2014 Document Reviewed: 09/14/2011 Elsevier Interactive Patient Education 2016 [place] Maintenance, Female Adopting a healthy lifestyle and getting preventive care can go a long way to promote health and wellness. Talk with your health care provider about what schedule of regular examinations is right  for you. This is a good chance for you to check in with your provider about disease prevention and staying healthy. In between checkups, there are plenty of things you can do on your own. Experts have done a lot of research about which lifestyle changes and preventive measures are most likely to keep you healthy. Ask your health care provider for more information. WEIGHT AND DIET  Eat a healthy diet  Be sure to include plenty of vegetables, fruits, low-fat dairy products, and lean protein.  Do not eat a lot of foods high in solid fats, added sugars, or salt.  Get regular exercise. This is one of the most important things you can do for your health.  Most adults should exercise for at least 150 minutes each week. The exercise should increase your heart rate and make you sweat (moderate-intensity exercise).  Most adults should also do strengthening exercises at least twice a week. This is in addition to the moderate-intensity exercise.  Maintain a healthy weight  Body mass index (BMI) is a measurement that can be used to identify possible weight problems. It estimates body fat based on height and weight. Your health care provider can help determine your BMI and help you achieve or maintain a healthy weight.  For females 20 years of age and older:   A BMI below 18.5 is considered underweight.  A BMI of 18.5 to 24.9 is normal.  A BMI of 25 to 29.9 is considered overweight.  A BMI of 30 and above is considered obese.  Watch levels of cholesterol and blood lipids  You should start having your blood tested for lipids and cholesterol at 80 years of age, then have this test every 5 years.  You may need to have your cholesterol levels checked more often if:  Your lipid or cholesterol levels are high.  You are older than 80 years of age.  You are at high risk for heart disease.  CANCER SCREENING   Lung Cancer  Lung cancer screening is recommended for adults 57-57 years old who are  at high risk for lung cancer because of a history of smoking.  A yearly low-dose CT scan of the lungs is recommended for people who:  Currently smoke.  Have quit within the past 15 years.  Have at least a 30-pack-year history of smoking. A pack year is smoking an average of one pack of cigarettes a day for 1 year.  Yearly screening should continue until it has been 15 years since you quit.  Yearly screening should stop if you develop a health problem that would prevent you from having lung cancer treatment.  Breast Cancer  Practice breast self-awareness. This means understanding how your breasts normally appear and feel.  It also means doing regular breast self-exams. Let your health care provider know about any changes, no matter how small.  If you  are in your 20s or 30s, you should have a clinical breast exam (CBE) by a health care provider every 1-3 years as part of a regular health exam.  If you are 62 or older, have a CBE every year. Also consider having a breast X-ray (mammogram) every year.  If you have a family history of breast cancer, talk to your health care provider about genetic screening.  If you are at high risk for breast cancer, talk to your health care provider about having an MRI and a mammogram every year.  Breast cancer gene (BRCA) assessment is recommended for women who have family members with BRCA-related cancers. BRCA-related cancers include:  Breast.  Ovarian.  Tubal.  Peritoneal cancers.  Results of the assessment will determine the need for genetic counseling and BRCA1 and BRCA2 testing. Cervical Cancer Your health care provider may recommend that you be screened regularly for cancer of the pelvic organs (ovaries, uterus, and vagina). This screening involves a pelvic examination, including checking for microscopic changes to the surface of your cervix (Pap test). You may be encouraged to have this screening done every 3 years, beginning at age  82.  For women ages 99-65, health care providers may recommend pelvic exams and Pap testing every 3 years, or they may recommend the Pap and pelvic exam, combined with testing for human papilloma virus (HPV), every 5 years. Some types of HPV increase your risk of cervical cancer. Testing for HPV may also be done on women of any age with unclear Pap test results.  Other health care providers may not recommend any screening for nonpregnant women who are considered low risk for pelvic cancer and who do not have symptoms. Ask your health care provider if a screening pelvic exam is right for you.  If you have had past treatment for cervical cancer or a condition that could lead to cancer, you need Pap tests and screening for cancer for at least 20 years after your treatment. If Pap tests have been discontinued, your risk factors (such as having a new sexual partner) need to be reassessed to determine if screening should resume. Some women have medical problems that increase the chance of getting cervical cancer. In these cases, your health care provider may recommend more frequent screening and Pap tests. Colorectal Cancer  This type of cancer can be detected and often prevented.  Routine colorectal cancer screening usually begins at 80 years of age and continues through 80 years of age.  Your health care provider may recommend screening at an earlier age if you have risk factors for colon cancer.  Your health care provider may also recommend using home test kits to check for hidden blood in the stool.  A small camera at the end of a tube can be used to examine your colon directly (sigmoidoscopy or colonoscopy). This is done to check for the earliest forms of colorectal cancer.  Routine screening usually begins at age 70.  Direct examination of the colon should be repeated every 5-10 years through 80 years of age. However, you may need to be screened more often if early forms of precancerous polyps  or small growths are found. Skin Cancer  Check your skin from head to toe regularly.  Tell your health care provider about any new moles or changes in moles, especially if there is a change in a mole's shape or color.  Also tell your health care provider if you have a mole that is larger than the size of  a pencil eraser.  Always use sunscreen. Apply sunscreen liberally and repeatedly throughout the day.  Protect yourself by wearing long sleeves, pants, a wide-brimmed hat, and sunglasses whenever you are outside. HEART DISEASE, DIABETES, AND HIGH BLOOD PRESSURE   High blood pressure causes heart disease and increases the risk of stroke. High blood pressure is more likely to develop in:  People who have blood pressure in the high end of the normal range (130-139/85-89 mm Hg).  People who are overweight or obese.  People who are African American.  If you are 64-43 years of age, have your blood pressure checked every 3-5 years. If you are 15 years of age or older, have your blood pressure checked every year. You should have your blood pressure measured twice--once when you are at a hospital or clinic, and once when you are not at a hospital or clinic. Record the average of the two measurements. To check your blood pressure when you are not at a hospital or clinic, you can use:  An automated blood pressure machine at a pharmacy.  A home blood pressure monitor.  If you are between 68 years and 33 years old, ask your health care provider if you should take aspirin to prevent strokes.  Have regular diabetes screenings. This involves taking a blood sample to check your fasting blood sugar level.  If you are at a normal weight and have a low risk for diabetes, have this test once every three years after 80 years of age.  If you are overweight and have a high risk for diabetes, consider being tested at a younger age or more often. PREVENTING INFECTION  Hepatitis B  If you have a higher  risk for hepatitis B, you should be screened for this virus. You are considered at high risk for hepatitis B if:  You were born in a country where hepatitis B is common. Ask your health care provider which countries are considered high risk.  Your parents were born in a high-risk country, and you have not been immunized against hepatitis B (hepatitis B vaccine).  You have HIV or AIDS.  You use needles to inject street drugs.  You live with someone who has hepatitis B.  You have had sex with someone who has hepatitis B.  You get hemodialysis treatment.  You take certain medicines for conditions, including cancer, organ transplantation, and autoimmune conditions. Hepatitis C  Blood testing is recommended for:  Everyone born from [birth year] through 1963.  Anyone with known risk factors for hepatitis C. Sexually transmitted infections (STIs)  You should be screened for sexually transmitted infections (STIs) including gonorrhea and chlamydia if:  You are sexually active and are younger than 80 years of age.  You are older than 80 years of age and your health care provider tells you that you are at risk for this type of infection.  Your sexual activity has changed since you were last screened and you are at an increased risk for chlamydia or gonorrhea. Ask your health care provider if you are at risk.  If you do not have HIV, but are at risk, it may be recommended that you take a prescription medicine daily to prevent HIV infection. This is called pre-exposure prophylaxis (PrEP). You are considered at risk if:  You are sexually active and do not regularly use condoms or know the HIV status of your partner(s).  You take drugs by injection.  You are sexually active with a partner who has HIV. Talk  with your health care provider about whether you are at high risk of being infected with HIV. If you choose to begin PrEP, you should first be tested for HIV. You should then be tested every 3  months for as long as you are taking PrEP.  PREGNANCY   If you are premenopausal and you may become pregnant, ask your health care provider about preconception counseling.  If you may become pregnant, take 400 to 800 micrograms (mcg) of folic acid every day.  If you want to prevent pregnancy, talk to your health care provider about birth control (contraception). OSTEOPOROSIS AND MENOPAUSE   Osteoporosis is a disease in which the bones lose minerals and strength with aging. This can result in serious bone fractures. Your risk for osteoporosis can be identified using a bone density scan.  If you are 30 years of age or older, or if you are at risk for osteoporosis and fractures, ask your health care provider if you should be screened.  Ask your health care provider whether you should take a calcium or vitamin D supplement to lower your risk for osteoporosis.  Menopause may have certain physical symptoms and risks.  Hormone replacement therapy may reduce some of these symptoms and risks. Talk to your health care provider about whether hormone replacement therapy is right for you.  HOME CARE INSTRUCTIONS   Schedule regular health, dental, and eye exams.  Stay current with your immunizations.   Do not use any tobacco products including cigarettes, chewing tobacco, or electronic cigarettes.  If you are pregnant, do not drink alcohol.  If you are breastfeeding, limit how much and how often you drink alcohol.  Limit alcohol intake to no more than 1 drink per day for nonpregnant women. One drink equals 12 ounces of beer, 5 ounces of wine, or 1 ounces of hard liquor.  Do not use street drugs.  Do not share needles.  Ask your health care provider for help if you need support or information about quitting drugs.  Tell your health care provider if you often feel depressed.  Tell your health care provider if you have ever been abused or do not feel safe at home.   This information is  not intended to replace advice given to you by your health care provider. Make sure you discuss any questions you have with your health care provider.   Document Released: 07/22/2010 Document Revised: 01/27/2014 Document Reviewed: 12/08/2012 Elsevier Interactive Patient Education [employer].

## 2015-04-17 NOTE — Assessment & Plan Note (Signed)
Here for medicare wellness/physical  Diet: heart healthy  Physical activity: not sedentary  Depression/mood screen: negative  Hearing: intact to whispered voice  Visual acuity: grossly normal, performs annual eye exam  ADLs: capable  Fall risk: none  Home safety: good  Cognitive evaluation: intact to orientation, naming, recall and repetition  EOL planning: adv directives, full code/ I agree  I have personally reviewed and have noted  1. The patient's medical, surgical and social history  2. Their use of alcohol, tobacco or illicit drugs  3. Their current medications and supplements  4. The patient's functional ability including ADL's, fall risks, home safety risks and hearing or visual impairment.  5. Diet and physical activities  6. Evidence for depression or mood disorders 7. The roster of all physicians providing medical care to patient - is listed in the Snapshot section of the chart and reviewed today.    Today patient counseled on age appropriate routine health concerns for screening and prevention, each reviewed and up to date or declined. Immunizations reviewed and up to date or declined. Labs ordered and reviewed. Risk factors for depression reviewed and negative. Hearing function and visual acuity are intact. ADLs screened and addressed as needed. Functional ability and level of safety reviewed and appropriate. Education, counseling and referrals performed based on assessed risks today. Patient provided with a copy of personalized plan for preventive services.   Declined shingles shot, PAP, colonoscopy

## 2015-04-17 NOTE — Progress Notes (Signed)
Pre visit review using our clinic review tool, if applicable. No additional management support is needed unless otherwise documented below in the visit note. 

## 2015-04-17 NOTE — Progress Notes (Signed)
Subjective:   Lindsey Dickson is a 82 y.o. female who presents for Medicare Annual (Subsequent) preventive examination.  Review of Systems:  HRA assessment completed during visit;   The Patient was informed that this wellness visit is to identify risk and educate on how to reduce risk for increase disease through lifestyle changes.   ROS deferred to CPE exam with physician today  Medical and family hx Mother had HTN Brother had lung cancer  Medical  Heart failure / denies issues at present  BMI: 91  Diet; cooks; buys her own groceries Eat anything; eats breakfast; cereal and sometimes eggs  Lunch; depends on when she eats breakfast; drinks ensure Supper; eat meat and vegetables; chicken and cabbage  Eats sweets; loves to bake;  Likes peanuts;  States now that she knows she can eat, she will start eating more. Dtr Hassan Rowan will check on food in home every week so she can supplement as needed  Plan for the patient to eat 3 items at every meal; sandwich counts as 1. Will drink 2 ensures a day if able.  Exercise;  Exercises her legs;  Most days she gets out   SAFETY; lives in upstairs apt Still drives; no accidents  Safety reviewed for the home; lives in upstairs apt. Asked about long range plan; no plans to move; Dtr agreed  Does not get lonely; Keeps busy, loves to bake Given information on fall prevention and home safety No falls this year;   Medication review completed by doctor  Gait assessment: Get up and go is very good;   Mobilization and Functional losses in the last year/ no Sleep patterns/ sleeps ok;   Urinary or fecal incontinence reviewed/ no urinary or bowl issues Constipated at times  Did not have a plan if she had a fall;  Agreed to consider lifeline and information given Lifeline: http://www.lifelinesys.com/content/home; (262)113-9848 x2102   Counseling: Colonoscopy; aged out  EKG: 03/2014  Mammogram; no Dexa/ no PAP/ no  Hearing: 2000 hz  both ears Ophthalmology exam; vision good; wears glasses; had apt but canceled due to illness but will reschedule  Immunizations: declines all vaccines; flu and pneumonia  Depression; Denies mood issues; affect appropriate today  Memory; sometimes she forgets things per the dtr;  The patient states she is eating, not sure why she is losing weight; States she bakes as well MMSE 18/30; Orientation was good but stated this was the 3rd or 4th floor; [place]; and got the year when cued.  Could not subtract serial 3's from 20; could not write a sentence and failed diagram; Dtr to check for groceries every week to note what she has and what she has eaten;  Dtr states she has noted forgetfulness;  At risk for failing independent living;  No issues paying bills per the dtr and patient.  Advanced Directive; have been completed per the dtr.   Health advice or referrals To gain weight Lifeline in case she falls and is alone not near a phone  Current Care Team reviewed and updated   Cardiac Risk Factors include: advanced age (>31men, >53 women);dyslipidemia;family history of premature cardiovascular disease;sedentary lifestyle     Objective:     Vitals: BP 140/68 mmHg  Pulse 76  Ht 5\' 1"  (1.549 m)  Wt 91 lb (41.277 kg)  BMI 17.20 kg/m2  SpO2 97%  Body mass index is 17.2 kg/(m^2).   Tobacco History  Smoking status  . Never Smoker   Smokeless tobacco  . Not  on file     Counseling given: Not Answered   Past Medical History  Diagnosis Date  . IBS (irritable bowel syndrome)   . Constipation, chronic   . Allergic rhinitis   . CHF (congestive heart failure) ([place])   . Depression   . Diverticulitis   . GERD (gastroesophageal reflux disease)   . Hyperlipidemia   . LBP (low back pain)   . Colon polyps   . Hypertension    Past Surgical History  Procedure Laterality Date  . Appendectomy    . Tubal ligation    . Cataract extraction     Family History  Problem Relation  Age of Onset  . Hypertension Mother   . Lung cancer Brother   . Cancer Brother     lung   History  Sexual Activity  . Sexual Activity: No    Outpatient Encounter Prescriptions as of 04/17/2015  Medication Sig  . Cholecalciferol 1000 UNITS tablet Take 1,000 Units by mouth daily.    [name] ipratropium (ATROVENT) 0.06 % nasal spray Place 2 sprays into both nostrils 4 (four) times daily.  [name] KLOR-CON 8 MEQ tablet Take 1 tablet by mouth daily.  [name] loratadine (CLARITIN) 10 MG tablet Take 10 mg by mouth daily as needed for allergies.   [name] LORazepam (ATIVAN) 0.5 MG tablet TAKE 1 OR 2 OR 3 TABLETS AT BEDTIME AS NEEDED FOR SLEEP  . magnesium hydroxide (MILK OF MAGNESIA) 400 MG/5ML suspension Take 7.5 mLs by mouth daily as needed for mild constipation.    No facility-administered encounter medications on file as of 04/17/2015.    Activities of Daily Living In your present state of health, do you have any difficulty performing the following activities: 04/17/2015 04/20/2014  Hearing? N N  Vision? N Y  Difficulty concentrating or making decisions? (No Data) Y  Walking or climbing stairs? N N  Dressing or bathing? N N  Doing errands, shopping? (No Data) N  Preparing Food and eating ? N -  Using the Toilet? N -  In the past six months, have you accidently leaked urine? N -  Do you have problems with loss of bowel control? N -  Managing your Medications? N -  Managing your Finances? N -  Housekeeping or managing your Housekeeping? N -    Patient Care Team: Cassandria Anger, MD as PCP - General Gatha Mayer, MD (Gastroenterology)    Assessment:    Exercise Activities and Dietary recommendations Current Exercise Habits: Home exercise routine, Type of exercise: walking, Time (Minutes): 15, Frequency (Times/Week): 3, Weekly Exercise (Minutes/Week): 45, Intensity: Mild  Goals    . Gain weight     Will drink at least 2 ensures a day;  Will eat more nuts, ice cream;       Fall Risk Fall  Risk  04/17/2015 05/26/2014 04/20/2014 02/24/2014  Falls in the past year? No No No No   Depression Screen PHQ 2/9 Scores 04/17/2015 05/26/2014 04/20/2014  PHQ - 2 Score 0 0 0     Cognitive Testing MMSE - Mini Mental State Exam 04/17/2015  Orientation to time 4  Orientation to Place 2  Registration 3  Attention/ Calculation 0  Recall 2  Language- name 2 objects 2  Language- repeat 1  Language- follow 3 step command 3  Language- read & follow direction 1  Write a sentence 0  Copy design 0  Total score 18    There is no immunization history for the selected administration types  on file for this patient. Screening Tests Health Maintenance  Topic Date Due  . TETANUS/TDAP  01/23/1941  . ZOSTAVAX  01/23/1982  . DEXA SCAN  01/24/1987  . PNA vac Low Risk Adult (1 of 2 - PCV13) 01/24/1987  . INFLUENZA VACCINE  04/20/2015 (Originally 08/21/2014)      Plan:   Need to schedule eye exam.  The doctor said eat what you want!   Put 3 things on your plate every meal  Eat snack every night   Drink 2 ensure  per day   Lifeline: http://www.lifelinesys.com/content/home; (740)412-9440 x2102   During the course of the visit the patient was educated and counseled about the following appropriate screening and preventive services:   Vaccines to include Pneumoccal, Influenza, Hepatitis B, Td, Zostavax, HCV- Declines all   Electrocardiogram  03/2014  Cardiovascular Disease/ BP good today  Colorectal cancer screening- aged out  Bone density screening/ declines  Diabetes screening/ checking labs  Glaucoma screening; denies; to schedule eye exam  Mammography/no  Nutrition counseling   Discussed gaining weight; Not sure she is eating as much as she states she eats.  Memory is not perfect; Plan to gain a few pounds, keep it simple; 3 food items every meal; 2 ensure a day and eat a snack at hs.  Dtr is to observe groceries in home every week.   Patient Instructions (the written plan) was  given to the patient.   Wynetta Fines, RN  04/17/2015  Medical screening examination/treatment/procedure(s) were performed by non-physician practitioner and as supervising physician I was immediately available for consultation/collaboration. I agree with above. Walker Kehr, MD

## 2015-04-17 NOTE — Progress Notes (Signed)
Subjective:  Patient ID: Lindsey Dickson, female    DOB: 13-Oct-1920  Age: 82 y.o. MRN: [id number]  CC: No chief complaint on file.   HPI INDU GOYETTE presents for a well exam. Off BP meds  Outpatient Prescriptions Prior to Visit  Medication Sig Dispense Refill  . Cholecalciferol 1000 UNITS tablet Take 1,000 Units by mouth daily.      [name] ipratropium (ATROVENT) 0.06 % nasal spray Place 2 sprays into both nostrils 4 (four) times daily. 15 mL 1  . loratadine (CLARITIN) 10 MG tablet Take 10 mg by mouth daily as needed for allergies.     [name] LORazepam (ATIVAN) 0.5 MG tablet TAKE 1 OR 2 OR 3 TABLETS AT BEDTIME AS NEEDED FOR SLEEP 90 tablet 2  . magnesium hydroxide (MILK OF MAGNESIA) 400 MG/5ML suspension Take 7.5 mLs by mouth daily as needed for mild constipation.      No facility-administered medications prior to visit.    ROS Review of Systems  Constitutional: Negative for chills, activity change, appetite change, fatigue and unexpected weight change.  HENT: Negative for congestion, mouth sores and sinus pressure.   Eyes: Negative for visual disturbance.  Respiratory: Negative for cough and chest tightness.   Gastrointestinal: Negative for nausea and abdominal pain.  Genitourinary: Negative for frequency, difficulty urinating and vaginal pain.  Musculoskeletal: Positive for arthralgias. Negative for back pain and gait problem.  Skin: Negative for pallor and rash.  Neurological: Negative for dizziness, tremors, weakness, numbness and headaches.  Psychiatric/Behavioral: Negative for suicidal ideas, confusion and sleep disturbance. The patient is nervous/anxious.     Objective:  BP 140/68 mmHg  Pulse 76  Ht 5\' 1"  (1.549 m)  Wt 91 lb (41.277 kg)  BMI 17.20 kg/m2  SpO2 97%  BP Readings from Last 3 Encounters:  04/17/15 140/68  03/13/15 126/84  12/27/14 144/70    Wt Readings from Last 3 Encounters:  04/17/15 91 lb (41.277 kg)  12/27/14 92 lb (41.731 kg)  10/03/14 94 lb 12.8 oz  (43.001 kg)    Physical Exam  Constitutional: She appears well-developed. No distress.  HENT:  Head: Normocephalic.  Right Ear: External ear normal.  Left Ear: External ear normal.  Nose: Nose normal.  Mouth/Throat: Oropharynx is clear and moist.  Eyes: Conjunctivae are normal. Pupils are equal, round, and reactive to light. Right eye exhibits no discharge. Left eye exhibits no discharge.  Neck: Normal range of motion. Neck supple. No JVD present. No tracheal deviation present. No thyromegaly present.  Cardiovascular: Normal rate, regular rhythm and normal heart sounds.   Pulmonary/Chest: No stridor. No respiratory distress. She has no wheezes.  Abdominal: Soft. Bowel sounds are normal. She exhibits no distension and no mass. There is no tenderness. There is no rebound and no guarding.  Musculoskeletal: She exhibits no edema or tenderness.  Lymphadenopathy:    She has no cervical adenopathy.  Neurological: She displays normal reflexes. No cranial nerve deficit. She exhibits normal muscle tone. Coordination normal.  Skin: No rash noted. No erythema.  Psychiatric: She has a normal mood and affect. Her behavior is normal. Judgment and thought content normal.    Lab Results  Component Value Date   WBC 5.4 12/28/2010   HGB 16.0* 08/15/2013   HCT 47.0* 08/15/2013   PLT 257 12/28/2010   GLUCOSE 70 09/29/2014   CHOL 279* 12/20/2009   TRIG 169.0* 12/20/2009   HDL 65.20 12/20/2009   LDLDIRECT 177.6 12/20/2009   ALT 12 12/20/2009   AST 19  12/20/2009   NA 142 09/29/2014   K 3.6 09/29/2014   CL 102 09/29/2014   CREATININE 0.98 09/29/2014   BUN 11 09/29/2014   CO2 33* 09/29/2014   TSH 0.71 04/20/2014    Dg Chest 2 View  03/13/2015  CLINICAL DATA:  Cough, fever. EXAM: CHEST  2 VIEW COMPARISON:  None. FINDINGS: Hyperinflation of the lungs. Heart and mediastinal contours are within normal limits. No focal opacities or effusions. No acute bony abnormality. IMPRESSION: Hyperinflation.  No  active disease. Electronically Signed   By: Rolm Baptise M.D.   On: 03/13/2015 15:00    Assessment & Plan:   There are no diagnoses linked to this encounter. I am having Ms. Froelich maintain her loratadine, magnesium hydroxide, Cholecalciferol, LORazepam, ipratropium, and KLOR-CON.  Meds ordered this encounter  Medications  . KLOR-CON 8 MEQ tablet    Sig: Take 1 tablet by mouth daily.     Follow-up: No Follow-up on file.  Walker Kehr, MD

## 2015-04-18 LAB — URINALYSIS
Bilirubin Urine: NEGATIVE
HGB URINE DIPSTICK: NEGATIVE
KETONES UR: NEGATIVE
LEUKOCYTES UA: NEGATIVE
Nitrite: NEGATIVE
SPECIFIC GRAVITY, URINE: 1.01 (ref 1.000–1.030)
Total Protein, Urine: NEGATIVE
URINE GLUCOSE: NEGATIVE
UROBILINOGEN UA: 0.2 (ref 0.0–1.0)
pH: 8 (ref 5.0–8.0)

## 2015-06-17 ENCOUNTER — Other Ambulatory Visit: Payer: Self-pay | Admitting: Internal Medicine

## 2015-06-25 ENCOUNTER — Ambulatory Visit (INDEPENDENT_AMBULATORY_CARE_PROVIDER_SITE_OTHER): Payer: Medicare Other | Admitting: Family

## 2015-06-25 ENCOUNTER — Other Ambulatory Visit (INDEPENDENT_AMBULATORY_CARE_PROVIDER_SITE_OTHER): Payer: Medicare Other

## 2015-06-25 ENCOUNTER — Encounter: Payer: Self-pay | Admitting: Family

## 2015-06-25 ENCOUNTER — Telehealth: Payer: Self-pay | Admitting: Internal Medicine

## 2015-06-25 ENCOUNTER — Other Ambulatory Visit: Payer: Medicare Other

## 2015-06-25 VITALS — BP 110/80 | HR 87 | Temp 98.3°F | Ht 61.0 in | Wt 92.5 lb

## 2015-06-25 DIAGNOSIS — R3589 Other polyuria: Secondary | ICD-10-CM

## 2015-06-25 DIAGNOSIS — R358 Other polyuria: Secondary | ICD-10-CM

## 2015-06-25 LAB — POC URINALSYSI DIPSTICK (AUTOMATED)
BILIRUBIN UA: NEGATIVE
Glucose, UA: NEGATIVE
KETONES UA: NEGATIVE
LEUKOCYTES UA: NEGATIVE
Nitrite, UA: NEGATIVE
PH UA: 7
PROTEIN UA: NEGATIVE
RBC UA: NEGATIVE
SPEC GRAV UA: 1.015
Urobilinogen, UA: 0.2

## 2015-06-25 LAB — COMPREHENSIVE METABOLIC PANEL
ALT: 9 U/L (ref 0–35)
AST: 17 U/L (ref 0–37)
Albumin: 3.8 g/dL (ref 3.5–5.2)
Alkaline Phosphatase: 86 U/L (ref 39–117)
BILIRUBIN TOTAL: 0.4 mg/dL (ref 0.2–1.2)
BUN: 11 mg/dL (ref 6–23)
CALCIUM: 9.6 mg/dL (ref 8.4–10.5)
CO2: 30 meq/L (ref 19–32)
Chloride: 103 mEq/L (ref 96–112)
Creatinine, Ser: 0.89 mg/dL (ref 0.40–1.20)
GFR: 76.05 mL/min (ref >60.00)
GLUCOSE: 99 mg/dL (ref 70–99)
POTASSIUM: 3.9 meq/L (ref 3.5–5.1)
Sodium: 139 mEq/L (ref 135–145)
Total Protein: 7.8 g/dL (ref 6.0–8.3)

## 2015-06-25 NOTE — Telephone Encounter (Signed)
Pt seen today my Mable Paris, FNP. Closing phone note.

## 2015-06-25 NOTE — Progress Notes (Signed)
Pre visit review using our clinic review tool, if applicable. No additional management support is needed unless otherwise documented below in the visit note. 

## 2015-06-25 NOTE — Telephone Encounter (Signed)
Patient Name: Lindsey Dickson  DOB: 09-20-1920    Initial Comment Caller states, last night had swelling legs, and had to urinate a lot. She wants an appt.    Nurse Assessment  Nurse: Leilani Merl, RN, Heather Date/Time (Eastern Time): 06/25/2015 11:37:18 AM  Confirm and document reason for call. If symptomatic, describe symptoms. You must click the next button to save text entered. ---Caller states, last night had swelling legs, and had to urinate a lot. She wants an appt.  Has the patient traveled out of the country within the last 30 days? ---Not Applicable  Does the patient have any new or worsening symptoms? ---Yes  Will a triage be completed? ---Yes  Related visit to physician within the last 2 weeks? ---No  Does the PT have any chronic conditions? (i.e. diabetes, asthma, etc.) ---Yes  List chronic conditions. ---See MR  Is this a behavioral health or substance abuse call? ---No     Guidelines    Guideline Title Affirmed Question Affirmed Notes  Leg Swelling and Edema [1] Thigh, calf, or ankle swelling AND [2] bilateral AND [3] 1 side is more swollen    Final Disposition User   See Physician within 4 Hours (or PCP triage) Standifer, RN, [place] states that she does not want to go to the ED or [facility]CC, she would rather be able to be worked in at the office this afternoon. Please call her and let her know what she needs to do.   Referrals  GO TO FACILITY REFUSED   Disagree/Comply: Disagree  Disagree/Comply Reason: Disagree with instructions

## 2015-06-25 NOTE — Patient Instructions (Signed)
No active infection at this time. We will get some lab results on your way out. We are pending urinary culture.  If symptoms continue tonight, please call us in the morning and let us know.  If there is no improvement in your symptoms, or if there is any worsening of symptoms, or if you have any additional concerns, please return for re-evaluation; or, if we are closed, consider going to the Emergency Room for evaluation if symptoms urgent.   Urinary Frequency The number of times a normal person urinates depends upon how much liquid they take in and how much liquid they are losing. If the temperature is hot and there is high humidity, then the person will sweat more and usually breathe a little more frequently. These factors decrease the amount of frequency of urination that would be considered normal. The amount you drink is easily determined, but the amount of fluid lost is sometimes more difficult to calculate.  Fluid is lost in two ways:  Sensible fluid loss is usually measured by the amount of urine that you get rid of. Losses of fluid can also occur with diarrhea.  Insensible fluid loss is more difficult to measure. It is caused by evaporation. Insensible loss of fluid occurs through breathing and sweating. It usually ranges from a little less than a quart to a little more than a quart of fluid a day. In normal temperatures and activity levels, the average person may urinate 4 to 7 times in a 24-hour period. Needing to urinate more often than that could indicate a problem. If one urinates 4 to 7 times in 24 hours and has large volumes each time, that could indicate a different problem from one who urinates 4 to 7 times a day and has small volumes. The time of urinating is also important. Most urinating should be done during the waking hours. Getting up at night to urinate frequently can indicate some problems. CAUSES  The bladder is the organ in your lower abdomen that holds urine. Like a  balloon, it swells some as it fills up. Your nerves sense this and tell you it is time to head for the bathroom. There are a number of reasons that you might feel the need to urinate more often than usual. They include:  Urinary tract infection. This is usually associated with other signs such as burning when you urinate.  In men, problems with the prostate (a walnut-size gland that is located near the tube that carries urine out of your body). There are two reasons why the prostate can cause an increased frequency of urination:  An enlarged prostate that does not let the bladder empty well. If the bladder only half empties when you urinate, then it only has half the capacity to fill before you have to urinate again.  The nerves in the bladder become more hypersensitive with an increased size of the prostate even if the bladder empties completely.  Pregnancy.  Obesity. Excess weight is more likely to cause a problem for women than for men.  Bladder stones or other bladder problems.  Caffeine.  Alcohol.  Medications. For example, drugs that help the body get rid of extra fluid (diuretics) increase urine production. Some other medicines must be taken with lots of fluids.  Muscle or nerve weakness. This might be the result of a spinal cord injury, a stroke, multiple sclerosis, or Parkinson disease.  Long-standing diabetes can decrease the sensation of the bladder. This loss of sensation makes it harder  to sense the bladder needs to be emptied. Over a period of years, the bladder is stretched out by constant overfilling. This weakens the bladder muscles so that the bladder does not empty well and has less capacity to fill with new urine.  Interstitial cystitis (also called painful bladder syndrome). This condition develops because the tissues that line the inside of the bladder are inflamed (inflammation is the body's way of reacting to injury or infection). It causes pain and frequent  urination. It occurs in women more often than in men. DIAGNOSIS   To decide what might be causing your urinary frequency, your health care provider will probably:  Ask about symptoms you have noticed.  Ask about your overall health. This will include questions about any medications you are taking.  Do a physical examination.  Order some tests. These might include:  A blood test to check for diabetes or other health issues that could be contributing to the problem.  Urine testing. This could measure the flow of urine and the pressure on the bladder.  A test of your neurological system (the brain, spinal cord, and nerves). This is the system that senses the need to urinate.  A bladder test to check whether it is emptying completely when you urinate.  Cystoscopy. This test uses a thin tube with a tiny camera on it. It offers a look inside your urethra and bladder to see if there are problems.  Imaging tests. You might be given a contrast dye and then asked to urinate. X-rays are taken to see how your bladder is working. TREATMENT  It is important for you to be evaluated to determine if the amount or frequency that you have is unusual or abnormal. If it is found to be abnormal, the cause should be determined and this can usually be found out easily. Depending upon the cause, treatment could include medication, stimulation of the nerves, or surgery. There are not too many things that you can do as an individual to change your urinary frequency. It is important that you balance the amount of fluid intake needed to compensate for your activity and the temperature. Medical problems will be diagnosed and taken care of by your physician. There is no particular bladder training such as Kegel exercises that you can do to help urinary frequency. This is an exercise that is usually recommended for people who have leaking of urine when they laugh, cough, or sneeze. HOME CARE INSTRUCTIONS   Take any  medications your health care provider prescribed or suggested. Follow the directions carefully.  Practice any lifestyle changes that are recommended. These might include:  Drinking less fluid or drinking at different times of the day. If you need to urinate often during the night, for example, you may need to stop drinking fluids early in the evening.  Cutting down on caffeine or alcohol. They both can make you need to urinate more often than normal. Caffeine is found in coffee, tea, and sodas.  Losing weight, if that is recommended.  Keep a journal or a log. You might be asked to record how much you drink and when and where you feel the need to urinate. This will also help evaluate how well the treatment provided by your physician is working. SEEK MEDICAL CARE IF:   Your need to urinate often gets worse.  You feel increased pain or irritation when you urinate.  You notice blood in your urine.  You have questions about any medications that your health  care provider recommended.  You notice blood, pus, or swelling at the site of any test or treatment procedure.  You develop a fever of more than 100.45F (38.1C). SEEK IMMEDIATE MEDICAL CARE IF:  You develop a fever of more than 102.60F (38.9C).   This information is not intended to replace advice given to you by your health care provider. Make sure you discuss any questions you have with your health care provider.   Document Released: 11/02/2008 Document Revised: 01/27/2014 Document Reviewed: 11/02/2008 Elsevier Interactive Patient Education [employer].

## 2015-06-25 NOTE — Progress Notes (Signed)
Subjective:    Patient ID: Lindsey Dickson, female    DOB: [birth year], 82 y.o.   MRN: [id number]   AUDENE SHUMWAY is a 82 y.o. female who presents today for an acute visit.    HPI Comments: No history of diabetes. No history of urinary incontinence or feeling of failure to empty bladder.  Patient states PCP discontinued her amlodipine.  Urinary Frequency  This is a new problem. The current episode started today. The problem has been resolved. The patient is experiencing no pain. There has been no fever. She is not sexually active. There is no history of pyelonephritis. Associated symptoms include frequency. Pertinent negatives include no chills, flank pain, hematuria, nausea or vomiting. She has tried nothing for the symptoms. The treatment provided no relief. Her past medical history is significant for kidney stones. There is no history of recurrent UTIs.   Past Medical History  Diagnosis Date  . IBS (irritable bowel syndrome)   . Constipation, chronic   . Allergic rhinitis   . CHF (congestive heart failure) ([place])   . Depression   . Diverticulitis   . GERD (gastroesophageal reflux disease)   . Hyperlipidemia   . LBP (low back pain)   . Colon polyps   . Hypertension    Allergies: Ciprofloxacin; Hydrochlorothiazide; Penicillins; and Sulfonamide derivatives Current Outpatient Prescriptions on File Prior to Visit  Medication Sig Dispense Refill  . Cholecalciferol 1000 UNITS tablet Take 1,000 Units by mouth daily.      [name] KLOR-CON 8 MEQ tablet Take 1 tablet by mouth daily.    [name] loratadine (CLARITIN) 10 MG tablet Take 10 mg by mouth daily as needed for allergies.     [name] LORazepam (ATIVAN) 0.5 MG tablet TAKE 1 OR 2 OR 3 TABLETS AT BEDTIME AS NEEDED FOR SLEEP 90 tablet 2  . amLODipine (NORVASC) 5 MG tablet TAKE 1/2 TABLET DAILY (Patient not taking: Reported on 06/25/2015) 90 tablet 0  . ipratropium (ATROVENT) 0.06 % nasal spray Place 2 sprays into both nostrils 4 (four) times daily. (Patient  not taking: Reported on 06/25/2015) 15 mL 1  . magnesium hydroxide (MILK OF MAGNESIA) 400 MG/5ML suspension Take 7.5 mLs by mouth daily as needed for mild constipation. Reported on 06/25/2015     No current facility-administered medications on file prior to visit.    Social History  Substance Use Topics  . Smoking status: Never Smoker   . Smokeless tobacco: None  . Alcohol Use: No    Review of Systems  Constitutional: Negative for fever, chills and unexpected weight change.  Respiratory: Negative for cough.   Cardiovascular: Negative for chest pain, palpitations and leg swelling (resolved).  Gastrointestinal: Negative for nausea, vomiting and abdominal pain.  Endocrine: Negative for polydipsia and polyphagia.  Genitourinary: Positive for frequency. Negative for dysuria, hematuria, flank pain and difficulty urinating.      Objective:    BP 110/80 mmHg  Pulse 87  Temp(Src) 98.3 F (36.8 C) (Oral)  Ht 5\' 1"  (1.549 m)  Wt 92 lb 8 oz (41.958 kg)  BMI 17.49 kg/m2  SpO2 97%   Physical Exam  Constitutional: She appears well-developed and well-nourished.  Cardiovascular: Normal rate, regular rhythm, normal heart sounds and normal pulses.   No LE edema, palpable cords or masses. No erythema or increased warmth. Negative Homan sign bilaterally.  LE hair growth symmetric and present. No discoloration of varicosities noted. LE warm and palpable pedal pulses.   Pulmonary/Chest: Effort normal and breath sounds normal. She  has no wheezes. She has no rhonchi. She has no rales.  Abdominal: There is no CVA tenderness.  Neurological: She is alert.  Skin: Skin is warm and dry.  Psychiatric: She has a normal mood and affect. Her speech is normal and behavior is normal. Thought content normal.  Vitals reviewed.      Assessment & Plan:   1. Polyuria Urinary freuency at night. Patient is not diabetic. No evidence of fluid volume overload, lung sounds are clear and no lower extremity  swelling. Patient is not on any diuretics which may increase nocturia. UA is negative for leukocytes, nitrites, blood, glucose, ketones. Pending culture at this time. Pending lab work to evaluate glucose, electrolytes, kidney function. Patient, daughter, and I agreed that since urinary frequency has resolved since last night, we will wait on urine culture and lab work.  - CULTURE, URINE COMPREHENSIVE; Future - POCT Urinalysis Dipstick (Automated)    I am having Ms. Savitz maintain her loratadine, magnesium hydroxide, Cholecalciferol, LORazepam, ipratropium, KLOR-CON, and amLODipine.   No orders of the defined types were placed in this encounter.     Start medications as prescribed and explained to patient on After Visit Summary ( AVS). Risks, benefits, and alternatives of the medications and treatment plan prescribed today were discussed, and patient expressed understanding.   Education regarding symptom management and diagnosis given to patient.   Follow-up:Plan follow-up and return precautions given if any worsening symptoms or change in condition.   Continue to follow with Walker Kehr, MD for routine health maintenance.   Lindsey Dickson and I agreed with plan.   Mable Paris, FNP

## 2015-06-27 LAB — CULTURE, URINE COMPREHENSIVE
Colony Count: NO GROWTH
ORGANISM ID, BACTERIA: NO GROWTH

## 2015-07-03 ENCOUNTER — Ambulatory Visit (INDEPENDENT_AMBULATORY_CARE_PROVIDER_SITE_OTHER): Payer: Medicare Other | Admitting: Internal Medicine

## 2015-07-03 ENCOUNTER — Encounter: Payer: Self-pay | Admitting: Internal Medicine

## 2015-07-03 VITALS — BP 120/70 | HR 88 | Wt 93.0 lb

## 2015-07-03 DIAGNOSIS — K5901 Slow transit constipation: Secondary | ICD-10-CM | POA: Diagnosis not present

## 2015-07-03 DIAGNOSIS — I10 Essential (primary) hypertension: Secondary | ICD-10-CM | POA: Diagnosis not present

## 2015-07-03 DIAGNOSIS — Z658 Other specified problems related to psychosocial circumstances: Secondary | ICD-10-CM

## 2015-07-03 DIAGNOSIS — F439 Reaction to severe stress, unspecified: Secondary | ICD-10-CM

## 2015-07-03 DIAGNOSIS — R634 Abnormal weight loss: Secondary | ICD-10-CM

## 2015-07-03 DIAGNOSIS — F419 Anxiety disorder, unspecified: Secondary | ICD-10-CM

## 2015-07-03 NOTE — Progress Notes (Signed)
Pre visit review using our clinic review tool, if applicable. No additional management support is needed unless otherwise documented below in the visit note. 

## 2015-07-03 NOTE — Assessment & Plan Note (Signed)
Wt Readings from Last 3 Encounters:  07/03/15 93 lb (42.185 kg)  06/25/15 92 lb 8 oz (41.958 kg)  04/17/15 91 lb (41.277 kg)

## 2015-07-03 NOTE — Assessment & Plan Note (Signed)
MOM prn

## 2015-07-03 NOTE — Progress Notes (Signed)
Subjective:  Patient ID: Lindsey Dickson, female    DOB: 1920-10-31  Age: 82 y.o. MRN: [id number]  CC: No chief complaint on file.   HPI [name] presents for HTN, IBS. C/o stress at church.  Outpatient Prescriptions Prior to Visit  Medication Sig Dispense Refill  . amLODipine (NORVASC) 5 MG tablet TAKE 1/2 TABLET DAILY 90 tablet 0  . Cholecalciferol 1000 UNITS tablet Take 1,000 Units by mouth daily.      [name] ipratropium (ATROVENT) 0.06 % nasal spray Place 2 sprays into both nostrils 4 (four) times daily. 15 mL 1  . KLOR-CON 8 MEQ tablet Take 1 tablet by mouth daily.    [name] loratadine (CLARITIN) 10 MG tablet Take 10 mg by mouth daily as needed for allergies.     [name] LORazepam (ATIVAN) 0.5 MG tablet TAKE 1 OR 2 OR 3 TABLETS AT BEDTIME AS NEEDED FOR SLEEP 90 tablet 2  . magnesium hydroxide (MILK OF MAGNESIA) 400 MG/5ML suspension Take 7.5 mLs by mouth daily as needed for mild constipation. Reported on 06/25/2015     No facility-administered medications prior to visit.    ROS Review of Systems  Constitutional: Negative for chills, activity change, appetite change, fatigue and unexpected weight change.  HENT: Negative for congestion, mouth sores and sinus pressure.   Eyes: Negative for visual disturbance.  Respiratory: Negative for cough and chest tightness.   Gastrointestinal: Negative for nausea and abdominal pain.  Genitourinary: Negative for frequency, difficulty urinating and vaginal pain.  Musculoskeletal: Negative for back pain and gait problem.  Skin: Negative for pallor and rash.  Neurological: Negative for dizziness, tremors, weakness, numbness and headaches.  Psychiatric/Behavioral: Negative for confusion and sleep disturbance. The patient is nervous/anxious.     Objective:  BP 120/70 mmHg  Pulse 88  Wt 93 lb (42.185 kg)  SpO2 96%  BP Readings from Last 3 Encounters:  07/03/15 120/70  06/25/15 110/80  04/17/15 140/68    Wt Readings from Last 3 Encounters:  07/03/15  93 lb (42.185 kg)  06/25/15 92 lb 8 oz (41.958 kg)  04/17/15 91 lb (41.277 kg)    Physical Exam  Constitutional: She appears well-developed. No distress.  HENT:  Head: Normocephalic.  Right Ear: External ear normal.  Left Ear: External ear normal.  Nose: Nose normal.  Mouth/Throat: Oropharynx is clear and moist.  Eyes: Conjunctivae are normal. Pupils are equal, round, and reactive to light. Right eye exhibits no discharge. Left eye exhibits no discharge.  Neck: Normal range of motion. Neck supple. No JVD present. No tracheal deviation present. No thyromegaly present.  Cardiovascular: Normal rate, regular rhythm and normal heart sounds.   Pulmonary/Chest: No stridor. No respiratory distress. She has no wheezes.  Abdominal: Soft. Bowel sounds are normal. She exhibits no distension and no mass. There is no tenderness. There is no rebound and no guarding.  Musculoskeletal: She exhibits no edema or tenderness.  Lymphadenopathy:    She has no cervical adenopathy.  Neurological: She displays normal reflexes. No cranial nerve deficit. She exhibits normal muscle tone. Coordination normal.  Skin: No rash noted. No erythema.  Psychiatric: She has a normal mood and affect. Her behavior is normal. Judgment and thought content normal.    Lab Results  Component Value Date   WBC 5.8 04/17/2015   HGB 13.4 04/17/2015   HCT 39.9 04/17/2015   PLT 263.0 04/17/2015   GLUCOSE 99 06/25/2015   CHOL 229* 04/17/2015   TRIG 161.0* 04/17/2015   HDL 52.10  04/17/2015   LDLDIRECT 177.6 12/20/2009   LDLCALC 145* 04/17/2015   ALT 9 06/25/2015   AST 17 06/25/2015   NA 139 06/25/2015   K 3.9 06/25/2015   CL 103 06/25/2015   CREATININE 0.89 06/25/2015   BUN 11 06/25/2015   CO2 30 06/25/2015   TSH 0.81 04/17/2015    Dg Chest 2 View  03/13/2015  CLINICAL DATA:  Cough, fever. EXAM: CHEST  2 VIEW COMPARISON:  None. FINDINGS: Hyperinflation of the lungs. Heart and mediastinal contours are within normal  limits. No focal opacities or effusions. No acute bony abnormality. IMPRESSION: Hyperinflation.  No active disease. Electronically Signed   By: Rolm Baptise M.D.   On: 03/13/2015 15:00    Assessment & Plan:   There are no diagnoses linked to this encounter. I am having Ms. Wiesman maintain her loratadine, magnesium hydroxide, Cholecalciferol, LORazepam, ipratropium, KLOR-CON, and amLODipine.  No orders of the defined types were placed in this encounter.     Follow-up: No Follow-up on file.  Walker Kehr, MD

## 2015-07-03 NOTE — Assessment & Plan Note (Signed)
Chronic Lorazepam prn  Potential benefits of a long term benzodiazepines  use as well as potential risks  and complications were explained to the patient and were aknowledged. 

## 2015-07-03 NOTE — Assessment & Plan Note (Signed)
Discussed.

## 2015-07-03 NOTE — Assessment & Plan Note (Signed)
Pt does not want to take Rx, except for amlodipine when BP goes up 

## 2015-07-04 DIAGNOSIS — Z961 Presence of intraocular lens: Secondary | ICD-10-CM | POA: Diagnosis not present

## 2015-07-04 DIAGNOSIS — H52203 Unspecified astigmatism, bilateral: Secondary | ICD-10-CM | POA: Diagnosis not present

## 2015-07-04 DIAGNOSIS — H5203 Hypermetropia, bilateral: Secondary | ICD-10-CM | POA: Diagnosis not present

## 2015-07-16 ENCOUNTER — Ambulatory Visit: Payer: Medicare Other | Admitting: Internal Medicine

## 2015-07-21 ENCOUNTER — Other Ambulatory Visit: Payer: Self-pay | Admitting: Internal Medicine

## 2015-07-23 ENCOUNTER — Other Ambulatory Visit: Payer: Self-pay | Admitting: *Deleted

## 2015-07-23 NOTE — Telephone Encounter (Signed)
rx [facility]ed [facility].../lmb 

## 2015-09-03 ENCOUNTER — Ambulatory Visit (INDEPENDENT_AMBULATORY_CARE_PROVIDER_SITE_OTHER): Payer: Medicare Other | Admitting: Internal Medicine

## 2015-09-03 ENCOUNTER — Encounter: Payer: Self-pay | Admitting: Internal Medicine

## 2015-09-03 VITALS — BP 150/82 | HR 78 | Wt 95.0 lb

## 2015-09-03 DIAGNOSIS — H9193 Unspecified hearing loss, bilateral: Secondary | ICD-10-CM

## 2015-09-03 DIAGNOSIS — I5020 Unspecified systolic (congestive) heart failure: Secondary | ICD-10-CM

## 2015-09-03 DIAGNOSIS — K581 Irritable bowel syndrome with constipation: Secondary | ICD-10-CM | POA: Diagnosis not present

## 2015-09-03 DIAGNOSIS — K219 Gastro-esophageal reflux disease without esophagitis: Secondary | ICD-10-CM | POA: Diagnosis not present

## 2015-09-03 DIAGNOSIS — F419 Anxiety disorder, unspecified: Secondary | ICD-10-CM

## 2015-09-03 DIAGNOSIS — R142 Eructation: Secondary | ICD-10-CM

## 2015-09-03 DIAGNOSIS — I10 Essential (primary) hypertension: Secondary | ICD-10-CM | POA: Diagnosis not present

## 2015-09-03 DIAGNOSIS — R141 Gas pain: Secondary | ICD-10-CM

## 2015-09-03 DIAGNOSIS — R143 Flatulence: Secondary | ICD-10-CM

## 2015-09-03 DIAGNOSIS — M5440 Lumbago with sciatica, unspecified side: Secondary | ICD-10-CM

## 2015-09-03 DIAGNOSIS — G8929 Other chronic pain: Secondary | ICD-10-CM

## 2015-09-03 NOTE — Assessment & Plan Note (Signed)
Lorazepam prn  Potential benefits of a long term benzodiazepines  use as well as potential risks  and complications were explained to the patient and were aknowledged.  

## 2015-09-03 NOTE — Progress Notes (Signed)
Subjective:  Patient ID: Lindsey Dickson, female    DOB: December 05, 1920  Age: 82 y.o. MRN: [id number]  CC: No chief complaint on file.   HPI URVI BLOCKER presents for anxiety, HTN, allergies, OA f/u  Outpatient Medications Prior to Visit  Medication Sig Dispense Refill  . amLODipine (NORVASC) 5 MG tablet TAKE 1/2 TABLET DAILY 90 tablet 0  . Cholecalciferol 1000 UNITS tablet Take 1,000 Units by mouth daily.      [name] ipratropium (ATROVENT) 0.06 % nasal spray Place 2 sprays into both nostrils 4 (four) times daily. 15 mL 1  . KLOR-CON 8 MEQ tablet Take 1 tablet by mouth daily.    [name] loratadine (CLARITIN) 10 MG tablet Take 10 mg by mouth daily as needed for allergies.     [name] LORazepam (ATIVAN) 0.5 MG tablet TAKE 1,2,OR 3 TABLETS AT BEDTIME AS NEEDED FOR SLEEP 90 tablet 2  . magnesium hydroxide (MILK OF MAGNESIA) 400 MG/5ML suspension Take 7.5 mLs by mouth daily as needed for mild constipation. Reported on 06/25/2015     No facility-administered medications prior to visit.     ROS Review of Systems  Constitutional: Positive for fatigue. Negative for activity change, appetite change, chills and unexpected weight change.  HENT: Negative for congestion, mouth sores and sinus pressure.   Eyes: Negative for visual disturbance.  Respiratory: Negative for cough and chest tightness.   Gastrointestinal: Negative for abdominal pain and nausea.  Genitourinary: Negative for difficulty urinating, frequency and vaginal pain.  Musculoskeletal: Positive for arthralgias and back pain. Negative for gait problem.  Skin: Negative for pallor and rash.  Neurological: Negative for dizziness, tremors, weakness, numbness and headaches.  Psychiatric/Behavioral: Negative for confusion and sleep disturbance. The patient is nervous/anxious.     Objective:  There were no vitals taken for this visit.  BP Readings from Last 3 Encounters:  07/03/15 120/70  06/25/15 110/80  04/17/15 140/68    Wt Readings from Last 3  Encounters:  07/03/15 93 lb (42.2 kg)  06/25/15 92 lb 8 oz (42 kg)  04/17/15 91 lb (41.3 kg)    Physical Exam  Constitutional: She appears well-developed. No distress.  HENT:  Head: Normocephalic.  Right Ear: External ear normal.  Left Ear: External ear normal.  Nose: Nose normal.  Mouth/Throat: Oropharynx is clear and moist.  Eyes: Conjunctivae are normal. Pupils are equal, round, and reactive to light. Right eye exhibits no discharge. Left eye exhibits no discharge.  Neck: Normal range of motion. Neck supple. No JVD present. No tracheal deviation present. No thyromegaly present.  Cardiovascular: Normal rate, regular rhythm and normal heart sounds.   Pulmonary/Chest: No stridor. No respiratory distress. She has no wheezes.  Abdominal: Soft. Bowel sounds are normal. She exhibits no distension and no mass. There is no tenderness. There is no rebound and no guarding.  Musculoskeletal: She exhibits no edema or tenderness.  Lymphadenopathy:    She has no cervical adenopathy.  Neurological: She displays normal reflexes. No cranial nerve deficit. She exhibits normal muscle tone. Coordination normal.  Skin: No rash noted. No erythema.  Psychiatric: She has a normal mood and affect. Her behavior is normal. Judgment and thought content normal.  Thin; looks young Narrow ear canals w/wax B  Lab Results  Component Value Date   WBC 5.8 04/17/2015   HGB 13.4 04/17/2015   HCT 39.9 04/17/2015   PLT 263.0 04/17/2015   GLUCOSE 99 06/25/2015   CHOL 229 (H) 04/17/2015   TRIG 161.0 (H) 04/17/2015  HDL 52.10 04/17/2015   LDLDIRECT 177.6 12/20/2009   LDLCALC 145 (H) 04/17/2015   ALT 9 06/25/2015   AST 17 06/25/2015   NA 139 06/25/2015   K 3.9 06/25/2015   CL 103 06/25/2015   CREATININE 0.89 06/25/2015   BUN 11 06/25/2015   CO2 30 06/25/2015   TSH 0.81 04/17/2015    Dg Chest 2 View  Result Date: 03/13/2015 CLINICAL DATA:  Cough, fever. EXAM: CHEST  2 VIEW COMPARISON:  None. FINDINGS:  Hyperinflation of the lungs. Heart and mediastinal contours are within normal limits. No focal opacities or effusions. No acute bony abnormality. IMPRESSION: Hyperinflation.  No active disease. Electronically Signed   By: Rolm Baptise M.D.   On: 03/13/2015 15:00    Assessment & Plan:   There are no diagnoses linked to this encounter. I am having Ms. Capley maintain her loratadine, magnesium hydroxide, Cholecalciferol, ipratropium, KLOR-CON, amLODipine, and LORazepam.  No orders of the defined types were placed in this encounter.    Follow-up: No Follow-up on file.  Walker Kehr, MD

## 2015-09-03 NOTE — Assessment & Plan Note (Signed)
Doing well on NAS diet 

## 2015-09-03 NOTE — Assessment & Plan Note (Signed)
Pepcid prn.

## 2015-09-03 NOTE — Progress Notes (Signed)
Pre visit review using our clinic review tool, if applicable. No additional management support is needed unless otherwise documented below in the visit note. 

## 2015-09-03 NOTE — Assessment & Plan Note (Signed)
Stable

## 2015-09-03 NOTE — Assessment & Plan Note (Signed)
On Amlodipine 

## 2015-09-03 NOTE — Assessment & Plan Note (Signed)
IBS stress related, chronic Discussed

## 2015-09-03 NOTE — Assessment & Plan Note (Signed)
Tylenol prn 

## 2015-09-03 NOTE — Assessment & Plan Note (Signed)
Will ref to Dr Ernesto Rutherford Narrow ear canals w/wax B

## 2015-09-04 ENCOUNTER — Ambulatory Visit: Payer: Medicare Other | Admitting: Internal Medicine

## 2015-09-27 DIAGNOSIS — H6122 Impacted cerumen, left ear: Secondary | ICD-10-CM | POA: Diagnosis not present

## 2015-09-28 ENCOUNTER — Other Ambulatory Visit: Payer: Self-pay | Admitting: Internal Medicine

## 2015-10-08 ENCOUNTER — Ambulatory Visit (INDEPENDENT_AMBULATORY_CARE_PROVIDER_SITE_OTHER): Payer: Medicare Other | Admitting: Internal Medicine

## 2015-10-08 ENCOUNTER — Encounter: Payer: Self-pay | Admitting: Internal Medicine

## 2015-10-08 DIAGNOSIS — K219 Gastro-esophageal reflux disease without esophagitis: Secondary | ICD-10-CM | POA: Diagnosis not present

## 2015-10-08 DIAGNOSIS — F419 Anxiety disorder, unspecified: Secondary | ICD-10-CM

## 2015-10-08 DIAGNOSIS — I10 Essential (primary) hypertension: Secondary | ICD-10-CM | POA: Diagnosis not present

## 2015-10-08 NOTE — Progress Notes (Signed)
Subjective:  Patient ID: Lindsey Dickson, female    DOB: 1920-06-12  Age: 82 y.o. MRN: [id number]  CC: No chief complaint on file.   HPI Lindsey Dickson presents for HTN - stopped Norvasc ince she changed diet to "no salt"; anxiety, allergies.  Outpatient Medications Prior to Visit  Medication Sig Dispense Refill  . Cholecalciferol 1000 UNITS tablet Take 1,000 Units by mouth daily.      [name] KLOR-CON 8 MEQ tablet Take 1 tablet by mouth daily.    [name] loratadine (CLARITIN) 10 MG tablet Take 10 mg by mouth daily as needed for allergies.     [name] LORazepam (ATIVAN) 0.5 MG tablet TAKE 1,2,OR 3 TABLETS AT BEDTIME AS NEEDED FOR SLEEP 90 tablet 2  . magnesium hydroxide (MILK OF MAGNESIA) 400 MG/5ML suspension Take 7.5 mLs by mouth daily as needed for mild constipation. Reported on 06/25/2015    . KLOR-CON 8 MEQ tablet TAKE 1 TABLET (8 MEQ TOTAL) BY MOUTH DAILY. 30 tablet 11  . ipratropium (ATROVENT) 0.06 % nasal spray Place 2 sprays into both nostrils 4 (four) times daily. (Patient not taking: Reported on 10/08/2015) 15 mL 1  . amLODipine (NORVASC) 5 MG tablet TAKE 1/2 TABLET DAILY (Patient not taking: Reported on 10/08/2015) 90 tablet 0   No facility-administered medications prior to visit.     ROS Review of Systems  Constitutional: Negative for activity change, appetite change, chills, fatigue and unexpected weight change.  HENT: Negative for congestion, mouth sores and sinus pressure.   Eyes: Negative for visual disturbance.  Respiratory: Negative for cough and chest tightness.   Gastrointestinal: Negative for abdominal pain and nausea.  Genitourinary: Negative for difficulty urinating, frequency and vaginal pain.  Musculoskeletal: Negative for back pain and gait problem.  Skin: Negative for pallor and rash.  Neurological: Negative for dizziness, tremors, weakness, numbness and headaches.  Psychiatric/Behavioral: Positive for sleep disturbance. Negative for confusion and suicidal ideas. The patient is  nervous/anxious.     Objective:  BP 120/82 (BP Location: Left Arm, Patient Position: Sitting, Cuff Size: Normal)   Pulse 87   Temp 97.8 F (36.6 C) (Oral)   Ht 5\' 1"  (1.549 m)   Wt 95 lb 8 oz (43.3 kg)   SpO2 98%   BMI 18.04 kg/m   BP Readings from Last 3 Encounters:  10/08/15 120/82  09/03/15 (!) 150/82  07/03/15 120/70    Wt Readings from Last 3 Encounters:  10/08/15 95 lb 8 oz (43.3 kg)  09/03/15 95 lb (43.1 kg)  07/03/15 93 lb (42.2 kg)    Physical Exam  Constitutional: She appears well-developed. No distress.  HENT:  Head: Normocephalic.  Right Ear: External ear normal.  Left Ear: External ear normal.  Nose: Nose normal.  Mouth/Throat: Oropharynx is clear and moist.  Eyes: Conjunctivae are normal. Pupils are equal, round, and reactive to light. Right eye exhibits no discharge. Left eye exhibits no discharge.  Neck: Normal range of motion. Neck supple. No JVD present. No tracheal deviation present. No thyromegaly present.  Cardiovascular: Normal rate, regular rhythm and normal heart sounds.   Pulmonary/Chest: No stridor. No respiratory distress. She has no wheezes.  Abdominal: Soft. Bowel sounds are normal. She exhibits no distension and no mass. There is no tenderness. There is no rebound and no guarding.  Musculoskeletal: She exhibits no edema or tenderness.  Lymphadenopathy:    She has no cervical adenopathy.  Neurological: She displays normal reflexes. No cranial nerve deficit. She exhibits normal muscle tone.  Coordination normal.  Skin: No rash noted. No erythema.  Psychiatric: She has a normal mood and affect. Her behavior is normal. Judgment and thought content normal.    Lab Results  Component Value Date   WBC 5.8 04/17/2015   HGB 13.4 04/17/2015   HCT 39.9 04/17/2015   PLT 263.0 04/17/2015   GLUCOSE 99 06/25/2015   CHOL 229 (H) 04/17/2015   TRIG 161.0 (H) 04/17/2015   HDL 52.10 04/17/2015   LDLDIRECT 177.6 12/20/2009   LDLCALC 145 (H) 04/17/2015     ALT 9 06/25/2015   AST 17 06/25/2015   NA 139 06/25/2015   K 3.9 06/25/2015   CL 103 06/25/2015   CREATININE 0.89 06/25/2015   BUN 11 06/25/2015   CO2 30 06/25/2015   TSH 0.81 04/17/2015    Dg Chest 2 View  Result Date: 03/13/2015 CLINICAL DATA:  Cough, fever. EXAM: CHEST  2 VIEW COMPARISON:  None. FINDINGS: Hyperinflation of the lungs. Heart and mediastinal contours are within normal limits. No focal opacities or effusions. No acute bony abnormality. IMPRESSION: Hyperinflation.  No active disease. Electronically Signed   By: Rolm Baptise M.D.   On: 03/13/2015 15:00    Assessment & Plan:   There are no diagnoses linked to this encounter. I have discontinued Ms. Creeden's amLODipine. I am also having her maintain her loratadine, magnesium hydroxide, Cholecalciferol, ipratropium, KLOR-CON, and LORazepam.  No orders of the defined types were placed in this encounter.    Follow-up: No Follow-up on file.  Walker Kehr, MD

## 2015-10-08 NOTE — Assessment & Plan Note (Signed)
Lorazepam prn  Potential benefits of a long term benzodiazepines  use as well as potential risks  and complications were explained to the patient and were aknowledged.  

## 2015-10-08 NOTE — Progress Notes (Signed)
Pre visit review using our clinic review tool, if applicable. No additional management support is needed unless otherwise documented below in the visit note. 

## 2015-10-08 NOTE — Assessment & Plan Note (Signed)
pt  stopped Norvasc ince she changed diet to "no salt"

## 2015-10-08 NOTE — Assessment & Plan Note (Signed)
Pepcid prn.

## 2015-10-23 ENCOUNTER — Ambulatory Visit: Payer: Medicare Other | Admitting: Internal Medicine

## 2015-10-25 ENCOUNTER — Ambulatory Visit: Payer: Medicare Other | Admitting: Internal Medicine

## 2015-11-13 ENCOUNTER — Ambulatory Visit: Payer: Medicare Other | Admitting: Internal Medicine

## 2015-12-01 ENCOUNTER — Other Ambulatory Visit: Payer: Self-pay | Admitting: Internal Medicine

## 2015-12-03 ENCOUNTER — Other Ambulatory Visit: Payer: Self-pay | Admitting: Internal Medicine

## 2015-12-04 NOTE — Telephone Encounter (Signed)
[facility] spoke w/Natalie gave MD approval.../lmb

## 2015-12-05 ENCOUNTER — Ambulatory Visit: Payer: Medicare Other | Admitting: Internal Medicine

## 2015-12-31 ENCOUNTER — Ambulatory Visit: Payer: Medicare Other | Admitting: Internal Medicine

## 2016-01-11 ENCOUNTER — Ambulatory Visit: Payer: Medicare Other | Admitting: Internal Medicine

## 2016-01-29 ENCOUNTER — Telehealth: Payer: Self-pay | Admitting: Internal Medicine

## 2016-01-29 ENCOUNTER — Ambulatory Visit: Payer: Medicare Other | Admitting: Nurse Practitioner

## 2016-01-29 ENCOUNTER — Other Ambulatory Visit: Payer: Self-pay | Admitting: Internal Medicine

## 2016-01-29 MED ORDER — LORAZEPAM 0.5 MG PO TABS: 0.5000 mg | ORAL_TABLET | Freq: Every evening | ORAL | 5 refills | Status: DC | PRN

## 2016-01-29 NOTE — Telephone Encounter (Signed)
Refill phoned in. See meds. Pt informed

## 2016-01-29 NOTE — Telephone Encounter (Signed)
Pt request refill for LORazepam (ATIVAN) 0.5 MG tablet. Please call her back

## 2016-01-29 NOTE — Telephone Encounter (Signed)
OK to fill this/these prescription(s) with additional refills x6 mo  Thank you!  

## 2016-02-06 ENCOUNTER — Ambulatory Visit: Payer: Medicare Other | Admitting: Internal Medicine

## 2016-02-19 ENCOUNTER — Ambulatory Visit: Payer: Medicare Other | Admitting: Internal Medicine

## 2016-02-26 ENCOUNTER — Ambulatory Visit (INDEPENDENT_AMBULATORY_CARE_PROVIDER_SITE_OTHER): Payer: Medicare Other | Admitting: Internal Medicine

## 2016-02-26 ENCOUNTER — Encounter: Payer: Self-pay | Admitting: Internal Medicine

## 2016-02-26 VITALS — BP 120/76 | HR 85 | Temp 97.8°F | Resp 20 | Wt 94.0 lb

## 2016-02-26 DIAGNOSIS — F5101 Primary insomnia: Secondary | ICD-10-CM

## 2016-02-26 DIAGNOSIS — R634 Abnormal weight loss: Secondary | ICD-10-CM | POA: Diagnosis not present

## 2016-02-26 DIAGNOSIS — I10 Essential (primary) hypertension: Secondary | ICD-10-CM

## 2016-02-26 DIAGNOSIS — E785 Hyperlipidemia, unspecified: Secondary | ICD-10-CM

## 2016-02-26 DIAGNOSIS — Z0000 Encounter for general adult medical examination without abnormal findings: Secondary | ICD-10-CM | POA: Diagnosis not present

## 2016-02-26 MED ORDER — LORAZEPAM 0.5 MG PO TABS: 0.5000 mg | ORAL_TABLET | Freq: Every evening | ORAL | 3 refills | Status: DC | PRN

## 2016-02-26 NOTE — Progress Notes (Signed)
Subjective:  Patient ID: Lindsey Dickson, female    DOB: 04/27/1920  Age: 83 y.o. MRN: [id number]  CC: No chief complaint on file.   HPI Lindsey Dickson presents for allergies, insomnia, elevated BP f/u.  Outpatient Medications Prior to Visit  Medication Sig Dispense Refill  . Cholecalciferol 1000 UNITS tablet Take 1,000 Units by mouth daily.      [name] ipratropium (ATROVENT) 0.06 % nasal spray Place 2 sprays into both nostrils 4 (four) times daily. 15 mL 1  . KLOR-CON 8 MEQ tablet Take 1 tablet by mouth daily.    [name] loratadine (CLARITIN) 10 MG tablet Take 10 mg by mouth daily as needed for allergies.     [name] LORazepam (ATIVAN) 0.5 MG tablet Take 1-3 tablets (0.5-1.5 mg total) by mouth at bedtime as needed for anxiety. 90 tablet 5  . magnesium hydroxide (MILK OF MAGNESIA) 400 MG/5ML suspension Take 7.5 mLs by mouth daily as needed for mild constipation. Reported on 06/25/2015     No facility-administered medications prior to visit.     ROS Review of Systems  Constitutional: Negative for activity change, appetite change, chills, fatigue and unexpected weight change.  HENT: Negative for congestion, mouth sores and sinus pressure.   Eyes: Negative for visual disturbance.  Respiratory: Negative for cough and chest tightness.   Gastrointestinal: Negative for abdominal pain and nausea.  Genitourinary: Negative for difficulty urinating, frequency and vaginal pain.  Musculoskeletal: Negative for back pain and gait problem.  Skin: Negative for pallor and rash.  Neurological: Negative for dizziness, tremors, weakness, numbness and headaches.  Psychiatric/Behavioral: Positive for sleep disturbance. Negative for confusion and suicidal ideas. The patient is nervous/anxious.     Objective:  BP 120/76   Pulse 85   Temp 97.8 F (36.6 C) (Oral)   Resp 20   Wt 94 lb (42.6 kg)   SpO2 97%   BMI 17.76 kg/m   BP Readings from Last 3 Encounters:  02/26/16 120/76  10/08/15 120/82  09/03/15 (!) 150/82     Wt Readings from Last 3 Encounters:  02/26/16 94 lb (42.6 kg)  10/08/15 95 lb 8 oz (43.3 kg)  09/03/15 95 lb (43.1 kg)    Physical Exam  Constitutional: She appears well-developed. No distress.  HENT:  Head: Normocephalic.  Right Ear: External ear normal.  Left Ear: External ear normal.  Nose: Nose normal.  Mouth/Throat: Oropharynx is clear and moist.  Eyes: Conjunctivae are normal. Pupils are equal, round, and reactive to light. Right eye exhibits no discharge. Left eye exhibits no discharge.  Neck: Normal range of motion. Neck supple. No JVD present. No tracheal deviation present. No thyromegaly present.  Cardiovascular: Normal rate, regular rhythm and normal heart sounds.   Pulmonary/Chest: No stridor. No respiratory distress. She has no wheezes.  Abdominal: Soft. Bowel sounds are normal. She exhibits no distension and no mass. There is no tenderness. There is no rebound and no guarding.  Musculoskeletal: She exhibits no edema or tenderness.  Lymphadenopathy:    She has no cervical adenopathy.  Neurological: She displays normal reflexes. No cranial nerve deficit. She exhibits normal muscle tone. Coordination normal.  Skin: No rash noted. No erythema.  Psychiatric: She has a normal mood and affect. Her behavior is normal. Judgment and thought content normal.  Thin A/o/c  Lab Results  Component Value Date   WBC 5.8 04/17/2015   HGB 13.4 04/17/2015   HCT 39.9 04/17/2015   PLT 263.0 04/17/2015   GLUCOSE 99 06/25/2015  CHOL 229 (H) 04/17/2015   TRIG 161.0 (H) 04/17/2015   HDL 52.10 04/17/2015   LDLDIRECT 177.6 12/20/2009   LDLCALC 145 (H) 04/17/2015   ALT 9 06/25/2015   AST 17 06/25/2015   NA 139 06/25/2015   K 3.9 06/25/2015   CL 103 06/25/2015   CREATININE 0.89 06/25/2015   BUN 11 06/25/2015   CO2 30 06/25/2015   TSH 0.81 04/17/2015    Dg Chest 2 View  Result Date: 03/13/2015 CLINICAL DATA:  Cough, fever. EXAM: CHEST  2 VIEW COMPARISON:  None. FINDINGS:  Hyperinflation of the lungs. Heart and mediastinal contours are within normal limits. No focal opacities or effusions. No acute bony abnormality. IMPRESSION: Hyperinflation.  No active disease. Electronically Signed   By: Rolm Baptise M.D.   On: 03/13/2015 15:00    Assessment & Plan:   There are no diagnoses linked to this encounter. I am having Ms. Tiso maintain her loratadine, magnesium hydroxide, Cholecalciferol, ipratropium, KLOR-CON, and LORazepam.  No orders of the defined types were placed in this encounter.    Follow-up: No Follow-up on file.  Walker Kehr, MD

## 2016-02-26 NOTE — Assessment & Plan Note (Signed)
NAS diet Not taking meds

## 2016-02-26 NOTE — Progress Notes (Signed)
Pre visit review using our clinic review tool, if applicable. No additional management support is needed unless otherwise documented below in the visit note. 

## 2016-02-26 NOTE — Assessment & Plan Note (Signed)
Lorazepam prn  Potential benefits of a long term benzodiazepines  use as well as potential risks  and complications were explained to the patient and were aknowledged.  

## 2016-02-26 NOTE — Assessment & Plan Note (Signed)
Wt Readings from Last 3 Encounters:  02/26/16 94 lb (42.6 kg)  10/08/15 95 lb 8 oz (43.3 kg)  09/03/15 95 lb (43.1 kg)

## 2016-05-28 ENCOUNTER — Ambulatory Visit (INDEPENDENT_AMBULATORY_CARE_PROVIDER_SITE_OTHER): Payer: Medicare Other | Admitting: Internal Medicine

## 2016-05-28 ENCOUNTER — Telehealth: Payer: Self-pay | Admitting: Internal Medicine

## 2016-05-28 ENCOUNTER — Encounter: Payer: Self-pay | Admitting: Internal Medicine

## 2016-05-28 DIAGNOSIS — R413 Other amnesia: Secondary | ICD-10-CM

## 2016-05-28 DIAGNOSIS — I10 Essential (primary) hypertension: Secondary | ICD-10-CM

## 2016-05-28 DIAGNOSIS — F5101 Primary insomnia: Secondary | ICD-10-CM | POA: Diagnosis not present

## 2016-05-28 MED ORDER — LORAZEPAM 0.5 MG PO TABS: 0.5000 mg | ORAL_TABLET | Freq: Every evening | ORAL | 3 refills | Status: DC | PRN

## 2016-05-28 NOTE — Assessment & Plan Note (Signed)
Mild cog deficit vs early dementia Discussed options

## 2016-05-28 NOTE — Telephone Encounter (Signed)
Pt daughter called and asked if she could speak to Dr. Camila Li before her mothers appt privately. States mother is having memory issues. I told her we could not do that. Also she is not on DPR, [employer] is, her grandson, but there is no DPR scanned into her documents. Pt daughter Hassan Rowan has power of attorney and will be bringing a copy of that into the visit today. Also I believe Pt needs to fill out a new DPR.

## 2016-05-28 NOTE — Patient Instructions (Signed)
MC well w/Jill 

## 2016-05-28 NOTE — Assessment & Plan Note (Signed)
Lorazepam prn 

## 2016-05-28 NOTE — Assessment & Plan Note (Signed)
Amlodipine - nottaking

## 2016-05-28 NOTE — Progress Notes (Signed)
Subjective:  Patient ID: Lindsey Dickson, female    DOB: 03/31/1920  Age: 83 y.o. MRN: [id number]  CC: No chief complaint on file.   HPI TERRICKA ONOFRIO presents for HTN, GERD, anxiety f/u  Outpatient Medications Prior to Visit  Medication Sig Dispense Refill  . Cholecalciferol 1000 UNITS tablet Take 1,000 Units by mouth daily.      [name] ipratropium (ATROVENT) 0.06 % nasal spray Place 2 sprays into both nostrils 4 (four) times daily. 15 mL 1  . KLOR-CON 8 MEQ tablet Take 1 tablet by mouth daily.    [name] loratadine (CLARITIN) 10 MG tablet Take 10 mg by mouth daily as needed for allergies.     [name] LORazepam (ATIVAN) 0.5 MG tablet Take 1-3 tablets (0.5-1.5 mg total) by mouth at bedtime as needed for anxiety. 90 tablet 3  . magnesium hydroxide (MILK OF MAGNESIA) 400 MG/5ML suspension Take 7.5 mLs by mouth daily as needed for mild constipation. Reported on 06/25/2015     No facility-administered medications prior to visit.     ROS Review of Systems  Constitutional: Negative for activity change, appetite change, chills, fatigue and unexpected weight change.  HENT: Negative for congestion, mouth sores and sinus pressure.   Eyes: Negative for visual disturbance.  Respiratory: Negative for cough and chest tightness.   Gastrointestinal: Negative for abdominal pain and nausea.  Genitourinary: Negative for difficulty urinating, frequency and vaginal pain.  Musculoskeletal: Negative for back pain and gait problem.  Skin: Negative for pallor and rash.  Neurological: Negative for dizziness, tremors, weakness, numbness and headaches.  Psychiatric/Behavioral: Positive for decreased concentration and sleep disturbance. Negative for confusion and suicidal ideas. The patient is nervous/anxious.     Objective:  BP 128/84 (BP Location: Left Arm, Patient Position: Sitting, Cuff Size: Normal)   Pulse 83   Temp 97.7 F (36.5 C) (Oral)   Ht 5\' 1"  (1.549 m)   Wt 94 lb 1.3 oz (42.7 kg)   SpO2 97%   BMI 17.78 kg/m    BP Readings from Last 3 Encounters:  05/28/16 128/84  02/26/16 120/76  10/08/15 120/82    Wt Readings from Last 3 Encounters:  05/28/16 94 lb 1.3 oz (42.7 kg)  02/26/16 94 lb (42.6 kg)  10/08/15 95 lb 8 oz (43.3 kg)    Physical Exam  Constitutional: She appears well-developed. No distress.  HENT:  Head: Normocephalic.  Right Ear: External ear normal.  Left Ear: External ear normal.  Nose: Nose normal.  Mouth/Throat: Oropharynx is clear and moist.  Eyes: Conjunctivae are normal. Pupils are equal, round, and reactive to light. Right eye exhibits no discharge. Left eye exhibits no discharge.  Neck: Normal range of motion. Neck supple. No JVD present. No tracheal deviation present. No thyromegaly present.  Cardiovascular: Normal rate, regular rhythm and normal heart sounds.   Pulmonary/Chest: No stridor. No respiratory distress. She has no wheezes.  Abdominal: Soft. Bowel sounds are normal. She exhibits no distension and no mass. There is no tenderness. There is no rebound and no guarding.  Musculoskeletal: She exhibits no edema or tenderness.  Lymphadenopathy:    She has no cervical adenopathy.  Neurological: She displays normal reflexes. No cranial nerve deficit. She exhibits normal muscle tone. Coordination normal.  Skin: No rash noted. No erythema.  Psychiatric: She has a normal mood and affect. Her behavior is normal. Judgment and thought content normal.    Lab Results  Component Value Date   WBC 5.8 04/17/2015   HGB 13.4  04/17/2015   HCT 39.9 04/17/2015   PLT 263.0 04/17/2015   GLUCOSE 99 06/25/2015   CHOL 229 (H) 04/17/2015   TRIG 161.0 (H) 04/17/2015   HDL 52.10 04/17/2015   LDLDIRECT 177.6 12/20/2009   LDLCALC 145 (H) 04/17/2015   ALT 9 06/25/2015   AST 17 06/25/2015   NA 139 06/25/2015   K 3.9 06/25/2015   CL 103 06/25/2015   CREATININE 0.89 06/25/2015   BUN 11 06/25/2015   CO2 30 06/25/2015   TSH 0.81 04/17/2015    Dg Chest 2 View  Result Date:  03/13/2015 CLINICAL DATA:  Cough, fever. EXAM: CHEST  2 VIEW COMPARISON:  None. FINDINGS: Hyperinflation of the lungs. Heart and mediastinal contours are within normal limits. No focal opacities or effusions. No acute bony abnormality. IMPRESSION: Hyperinflation.  No active disease. Electronically Signed   By: Rolm Baptise M.D.   On: 03/13/2015 15:00    Assessment & Plan:   There are no diagnoses linked to this encounter. I am having Ms. Valone maintain her loratadine, magnesium hydroxide, Cholecalciferol, ipratropium, KLOR-CON, and LORazepam.  No orders of the defined types were placed in this encounter.    Follow-up: No Follow-up on file.  Walker Kehr, MD

## 2016-05-29 NOTE — Telephone Encounter (Signed)
This was done yesterday at appt

## 2016-07-07 ENCOUNTER — Ambulatory Visit: Payer: Medicare Other | Admitting: Internal Medicine

## 2016-07-17 ENCOUNTER — Ambulatory Visit (INDEPENDENT_AMBULATORY_CARE_PROVIDER_SITE_OTHER): Payer: Medicare Other | Admitting: *Deleted

## 2016-07-17 VITALS — BP 127/62 | HR 77 | Resp 20 | Ht 61.0 in | Wt 95.0 lb

## 2016-07-17 DIAGNOSIS — Z0000 Encounter for general adult medical examination without abnormal findings: Secondary | ICD-10-CM | POA: Diagnosis not present

## 2016-07-17 NOTE — Progress Notes (Addendum)
Subjective:   Lindsey Dickson is a 83 y.o. female who presents for Medicare Annual (Subsequent) preventive examination.  Review of Systems:  No ROS.  Medicare Wellness Visit. Additional risk factors are reflected in the social history.  Cardiac Risk Factors include: advanced age (>56men, >39 women) Sleep patterns: has frequent nighttime awakenings, gets up 1-2 times nightly to void and sleeps 4-5 hours nightly.  Patient reports insomnia issues, discussed recommended sleep tips and stress reduction tips, education was attached to patient's AVS.  Home Safety/Smoke Alarms: Feels safe in home. Smoke alarms in place.  Living environment; residence and Firearm Safety: 1-story house/ trailer, no firearms. Lives alone, good family support Seat Belt Safety/Bike Helmet: Wears seat belt.   Counseling:   Eye Exam- appointment yearly Dental- dentures     Objective:     Vitals: BP 127/62   Pulse 77   Resp 20   Ht 5\' 1"  (1.549 m)   Wt 95 lb (43.1 kg)   SpO2 98%   BMI 17.95 kg/m   Body mass index is 17.95 kg/m.   Tobacco History  Smoking Status  . Never Smoker  Smokeless Tobacco  . Never Used     Counseling given: Not Answered   Past Medical History:  Diagnosis Date  . Allergic rhinitis   . CHF (congestive heart failure) ([place])   . Colon polyps   . Constipation, chronic   . Depression   . Diverticulitis   . GERD (gastroesophageal reflux disease)   . Hyperlipidemia   . Hypertension   . IBS (irritable bowel syndrome)   . LBP (low back pain)    Past Surgical History:  Procedure Laterality Date  . APPENDECTOMY    . CATARACT EXTRACTION    . TUBAL LIGATION     Family History  Problem Relation Age of Onset  . Hypertension Mother   . Lung cancer Brother   . Cancer Brother        lung   History  Sexual Activity  . Sexual activity: No    Outpatient Encounter Prescriptions as of 07/17/2016  Medication Sig  . acetaminophen (TYLENOL) 325 MG tablet Take 650 mg by mouth  every 6 (six) hours as needed.  . Cholecalciferol 1000 UNITS tablet Take 1,000 Units by mouth daily.    [name] ipratropium (ATROVENT) 0.06 % nasal spray Place 2 sprays into both nostrils 4 (four) times daily.  [name] KLOR-CON 8 MEQ tablet Take 1 tablet by mouth daily.  [name] LORazepam (ATIVAN) 0.5 MG tablet Take 1-3 tablets (0.5-1.5 mg total) by mouth at bedtime as needed for anxiety.  . magnesium hydroxide (MILK OF MAGNESIA) 400 MG/5ML suspension Take 7.5 mLs by mouth daily as needed for mild constipation. Reported on 06/25/2015  . [DISCONTINUED] loratadine (CLARITIN) 10 MG tablet Take 10 mg by mouth daily as needed for allergies.    No facility-administered encounter medications on file as of 07/17/2016.     Activities of Daily Living In your present state of health, do you have any difficulty performing the following activities: 07/17/2016  Hearing? N  Vision? N  Difficulty concentrating or making decisions? Y  Walking or climbing stairs? N  Dressing or bathing? N  Doing errands, shopping? Y  Preparing Food and eating ? N  Using the Toilet? N  In the past six months, have you accidently leaked urine? N  Do you have problems with loss of bowel control? N  Managing your Medications? N  Managing your Finances? Y  Housekeeping or  managing your Housekeeping? N  Some recent data might be hidden    Patient Care Team: Plotnikov, Evie Lacks, MD as PCP - General Gatha Mayer, MD (Gastroenterology)    Assessment:    Physical assessment deferred to PCP.  Exercise Activities and Dietary recommendations Current Exercise Habits: The patient does not participate in regular exercise at present, Exercise limited by: None identified  Diet (meal preparation, eat out, water intake, caffeinated beverages, dairy products, fruits and vegetables): in general, a "healthy" diet  , well balanced, reports having decreased appetite.  Discussed small frequent meals, encouraged patient to increase daily water intake  and supplementing with ensure ( coupons were provided)   Goals    . Gain weight          Will drink at least 2 ensures a day;  Will eat more nuts, ice cream;     . Stay as healthy as possible          Be active and enjoy life and family.      Fall Risk Fall Risk  07/17/2016 05/28/2016 04/17/2015 05/26/2014 04/20/2014  Falls in the past year? No Yes No No No  Number falls in past yr: - 1 - - -  Injury with Fall? - Yes - - -   Depression Screen PHQ 2/9 Scores 07/17/2016 05/28/2016 04/17/2015 05/26/2014  PHQ - 2 Score 0 0 0 0     Cognitive Function MMSE - Mini Mental State Exam 07/17/2016 04/17/2015  Orientation to time 3 4  Orientation to Place 5 2  Registration 3 3  Attention/ Calculation 0 0  Recall 0 2  Language- name 2 objects 2 2  Language- repeat 1 1  Language- follow 3 step command 3 3  Language- read & follow direction 1 1  Write a sentence 1 0  Copy design 1 0  Total score 20 18        There is no immunization history for the selected administration types on file for this patient. Screening Tests Health Maintenance  Topic Date Due  . TETANUS/TDAP  07/17/2017 (Originally 01/23/1939)  . PNA vac Low Risk Adult (1 of 2 - PCV13) 07/17/2017 (Originally 01/24/1987)  . INFLUENZA VACCINE  08/20/2016      Plan:    Start doing brain stimulating activities (puzzles, reading, adult coloring books, staying active) to keep memory sharp.   Continue to eat heart healthy diet (full of fruits, vegetables, whole grains, lean protein, water--limit salt, fat, and sugar intake) and increase physical activity as tolerated. Drink 1-2 ensures a day.  I have personally reviewed and noted the following in the patient's chart:   . Medical and social history . Use of alcohol, tobacco or illicit drugs  . Current medications and supplements . Functional ability and status . Nutritional status . Physical activity . Advanced directives . List of other physicians . Vitals . Screenings to  include cognitive, depression, and falls . Referrals and appointments  In addition, I have reviewed and discussed with patient certain preventive protocols, quality metrics, and best practice recommendations. A written personalized care plan for preventive services as well as general preventive health recommendations were provided to patient.     Michiel Cowboy, RN  07/17/2016   Medical screening examination/treatment/procedure(s) were performed by non-physician practitioner and as supervising physician I was immediately available for consultation/collaboration. I agree with above. Walker Kehr, MD

## 2016-07-17 NOTE — Progress Notes (Signed)
Pre visit review using our clinic review tool, if applicable. No additional management support is needed unless otherwise documented below in the visit note. 

## 2016-07-17 NOTE — Patient Instructions (Addendum)
Start doing brain stimulating activities (puzzles, reading, adult coloring books, staying active) to keep memory sharp.   Continue to eat heart healthy diet (full of fruits, vegetables, whole grains, lean protein, water--limit salt, fat, and sugar intake) and increase physical activity as tolerated. Drink 1-2 ensures a day.   Lindsey Dickson , Thank you for taking time to come for your Medicare Wellness Visit. I appreciate your ongoing commitment to your health goals. Please review the following plan we discussed and let me know if I can assist you in the future.   These are the goals we discussed: Goals    . Gain weight          Will drink at least 2 ensures a day;  Will eat more nuts, ice cream;     . Stay as healthy as possible          Be active and enjoy life and family.       This is a list of the screening recommended for you and due dates:  Health Maintenance  Topic Date Due  . Pneumonia vaccines (1 of 2 - PCV13) 01/24/1987  . Tetanus Vaccine  07/17/2017*  . Flu Shot  08/20/2016  *Topic was postponed. The date shown is not the original due date.

## 2016-08-05 DIAGNOSIS — M545 Low back pain: Secondary | ICD-10-CM | POA: Diagnosis not present

## 2016-08-15 ENCOUNTER — Other Ambulatory Visit: Payer: Self-pay | Admitting: Internal Medicine

## 2016-08-27 ENCOUNTER — Other Ambulatory Visit: Payer: Self-pay | Admitting: Internal Medicine

## 2016-08-27 NOTE — Telephone Encounter (Signed)
Patient has called in regard to state that she is currently out of this med and would like to have today if possible.

## 2016-08-28 NOTE — Telephone Encounter (Signed)
Pt called yesterday while the system was down checking on this as well.

## 2016-08-29 NOTE — Telephone Encounter (Signed)
RX was printed and given to pt on 05/28/16 for a year supply, pharmacy notified and is going to call pt to have her bring in RX

## 2016-09-02 ENCOUNTER — Encounter: Payer: Self-pay | Admitting: Internal Medicine

## 2016-09-02 ENCOUNTER — Other Ambulatory Visit (INDEPENDENT_AMBULATORY_CARE_PROVIDER_SITE_OTHER): Payer: Medicare Other

## 2016-09-02 ENCOUNTER — Ambulatory Visit (INDEPENDENT_AMBULATORY_CARE_PROVIDER_SITE_OTHER): Payer: Medicare Other | Admitting: Internal Medicine

## 2016-09-02 DIAGNOSIS — E785 Hyperlipidemia, unspecified: Secondary | ICD-10-CM

## 2016-09-02 DIAGNOSIS — F5101 Primary insomnia: Secondary | ICD-10-CM

## 2016-09-02 DIAGNOSIS — I10 Essential (primary) hypertension: Secondary | ICD-10-CM | POA: Diagnosis not present

## 2016-09-02 DIAGNOSIS — R634 Abnormal weight loss: Secondary | ICD-10-CM

## 2016-09-02 DIAGNOSIS — Z0000 Encounter for general adult medical examination without abnormal findings: Secondary | ICD-10-CM | POA: Diagnosis not present

## 2016-09-02 LAB — CBC WITH DIFFERENTIAL/PLATELET
Basophils Absolute: 0.1 10*3/uL (ref 0.0–0.1)
Basophils Relative: 0.9 % (ref 0.0–3.0)
EOS PCT: 2.8 % (ref 0.0–5.0)
Eosinophils Absolute: 0.2 10*3/uL (ref 0.0–0.7)
HEMATOCRIT: 42.3 % (ref 36.0–46.0)
HEMOGLOBIN: 13.6 g/dL (ref 12.0–15.0)
Lymphocytes Relative: 31.2 % (ref 12.0–46.0)
Lymphs Abs: 1.9 10*3/uL (ref 0.7–4.0)
MCHC: 32.2 g/dL (ref 30.0–36.0)
MCV: 88.1 fl (ref 78.0–100.0)
MONO ABS: 0.8 10*3/uL (ref 0.1–1.0)
MONOS PCT: 13.9 % — AB (ref 3.0–12.0)
Neutro Abs: 3.1 10*3/uL (ref 1.4–7.7)
Neutrophils Relative %: 51.2 % (ref 43.0–77.0)
Platelets: 278 10*3/uL (ref 150.0–400.0)
RBC: 4.8 Mil/uL (ref 3.87–5.11)
RDW: 13.5 % (ref 11.5–15.5)
WBC: 6.1 10*3/uL (ref 4.0–10.5)

## 2016-09-02 LAB — URINALYSIS
Bilirubin Urine: NEGATIVE
Hgb urine dipstick: NEGATIVE
Ketones, ur: NEGATIVE
Leukocytes, UA: NEGATIVE
NITRITE: NEGATIVE
PH: 6.5 (ref 5.0–8.0)
Specific Gravity, Urine: 1.01 (ref 1.000–1.030)
TOTAL PROTEIN, URINE-UPE24: NEGATIVE
Urine Glucose: NEGATIVE
Urobilinogen, UA: 0.2 (ref 0.0–1.0)

## 2016-09-02 LAB — BASIC METABOLIC PANEL
BUN: 13 mg/dL (ref 6–23)
CHLORIDE: 104 meq/L (ref 96–112)
CO2: 30 mEq/L (ref 19–32)
Calcium: 9.5 mg/dL (ref 8.4–10.5)
Creatinine, Ser: 0.96 mg/dL (ref 0.40–1.20)
GFR: 57.45 mL/min — AB (ref >60.00)
GLUCOSE: 120 mg/dL — AB (ref 70–99)
POTASSIUM: 3.5 meq/L (ref 3.5–5.1)
SODIUM: 141 meq/L (ref 135–145)

## 2016-09-02 LAB — LIPID PANEL
CHOL/HDL RATIO: 4
CHOLESTEROL: 213 mg/dL — AB (ref 0–200)
HDL: 47.6 mg/dL (ref >39.00)
LDL CALC: 134 mg/dL — AB (ref 0–99)
NonHDL: 165.88
Triglycerides: 157 mg/dL — ABNORMAL HIGH (ref 0.0–149.0)
VLDL: 31.4 mg/dL (ref 0.0–40.0)

## 2016-09-02 LAB — HEPATIC FUNCTION PANEL
ALT: 9 U/L (ref 0–35)
AST: 17 U/L (ref 0–37)
Albumin: 3.8 g/dL (ref 3.5–5.2)
Alkaline Phosphatase: 78 U/L (ref 39–117)
BILIRUBIN DIRECT: 0.1 mg/dL (ref 0.0–0.3)
BILIRUBIN TOTAL: 0.4 mg/dL (ref 0.2–1.2)
Total Protein: 7 g/dL (ref 6.0–8.3)

## 2016-09-02 LAB — TSH: TSH: 0.6 u[IU]/mL (ref 0.35–4.50)

## 2016-09-02 MED ORDER — LORAZEPAM 0.5 MG PO TABS: 1.0000 mg | ORAL_TABLET | Freq: Every evening | ORAL | 3 refills | Status: DC | PRN

## 2016-09-02 NOTE — Progress Notes (Signed)
Subjective:  Patient ID: [name], female    DOB: 17-Feb-1920  Age: 83 y.o. MRN: [id number]  CC: No chief complaint on file.   HPI PRINCELLA JASKIEWICZ presents for insomnia, anxiety, vit D def f/u. Pt refused all shots  Outpatient Medications Prior to Visit  Medication Sig Dispense Refill  . acetaminophen (TYLENOL) 325 MG tablet Take 650 mg by mouth every 6 (six) hours as needed.    . Cholecalciferol 1000 UNITS tablet Take 1,000 Units by mouth daily.      [name] ipratropium (ATROVENT) 0.06 % nasal spray Place 2 sprays into both nostrils 4 (four) times daily. 15 mL 1  . KLOR-CON 8 MEQ tablet Take 1 tablet by mouth daily.    [name] KLOR-CON 8 MEQ tablet TAKE 1 TABLET (8 MEQ TOTAL) BY MOUTH DAILY. 30 tablet 5  . LORazepam (ATIVAN) 0.5 MG tablet Take 1-3 tablets (0.5-1.5 mg total) by mouth at bedtime as needed for anxiety. 90 tablet 3  . magnesium hydroxide (MILK OF MAGNESIA) 400 MG/5ML suspension Take 7.5 mLs by mouth daily as needed for mild constipation. Reported on 06/25/2015     No facility-administered medications prior to visit.     ROS Review of Systems  Constitutional: Negative for activity change, appetite change, chills, fatigue and unexpected weight change.  HENT: Negative for congestion, mouth sores and sinus pressure.   Eyes: Negative for visual disturbance.  Respiratory: Negative for cough and chest tightness.   Gastrointestinal: Negative for abdominal pain and nausea.  Genitourinary: Negative for difficulty urinating, frequency and vaginal pain.  Musculoskeletal: Negative for back pain and gait problem.  Skin: Negative for pallor and rash.  Neurological: Negative for dizziness, tremors, weakness, numbness and headaches.  Psychiatric/Behavioral: Positive for decreased concentration and sleep disturbance. Negative for confusion.    Objective:  BP 122/78 (BP Location: Left Arm, Patient Position: Sitting, Cuff Size: Normal)   Pulse 73   Temp 98.4 F (36.9 C) (Oral)   Ht 5\' 1"   (1.549 m)   Wt 93 lb (42.2 kg)   SpO2 98%   BMI 17.57 kg/m   BP Readings from Last 3 Encounters:  09/02/16 122/78  07/17/16 127/62  05/28/16 128/84    Wt Readings from Last 3 Encounters:  09/02/16 93 lb (42.2 kg)  07/17/16 95 lb (43.1 kg)  05/28/16 94 lb 1.3 oz (42.7 kg)    Physical Exam  Constitutional: She appears well-developed. No distress.  HENT:  Head: Normocephalic.  Right Ear: External ear normal.  Left Ear: External ear normal.  Nose: Nose normal.  Mouth/Throat: Oropharynx is clear and moist.  Eyes: Pupils are equal, round, and reactive to light. Conjunctivae are normal. Right eye exhibits no discharge. Left eye exhibits no discharge.  Neck: Normal range of motion. Neck supple. No JVD present. No tracheal deviation present. No thyromegaly present.  Cardiovascular: Normal rate, regular rhythm and normal heart sounds.   Pulmonary/Chest: No stridor. No respiratory distress. She has no wheezes.  Abdominal: Soft. Bowel sounds are normal. She exhibits no distension and no mass. There is no tenderness. There is no rebound and no guarding.  Musculoskeletal: She exhibits no edema or tenderness.  Lymphadenopathy:    She has no cervical adenopathy.  Neurological: She displays normal reflexes. No cranial nerve deficit. She exhibits normal muscle tone. Coordination normal.  Skin: No rash noted. No erythema.  Psychiatric: She has a normal mood and affect. Her behavior is normal. Judgment and thought content normal.    Lab Results  Component Value Date   WBC 5.8 04/17/2015   HGB 13.4 04/17/2015   HCT 39.9 04/17/2015   PLT 263.0 04/17/2015   GLUCOSE 99 06/25/2015   CHOL 229 (H) 04/17/2015   TRIG 161.0 (H) 04/17/2015   HDL 52.10 04/17/2015   LDLDIRECT 177.6 12/20/2009   LDLCALC 145 (H) 04/17/2015   ALT 9 06/25/2015   AST 17 06/25/2015   NA 139 06/25/2015   K 3.9 06/25/2015   CL 103 06/25/2015   CREATININE 0.89 06/25/2015   BUN 11 06/25/2015   CO2 30 06/25/2015   TSH  0.81 04/17/2015    Dg Chest 2 View  Result Date: 03/13/2015 CLINICAL DATA:  Cough, fever. EXAM: CHEST  2 VIEW COMPARISON:  None. FINDINGS: Hyperinflation of the lungs. Heart and mediastinal contours are within normal limits. No focal opacities or effusions. No acute bony abnormality. IMPRESSION: Hyperinflation.  No active disease. Electronically Signed   By: Rolm Baptise M.D.   On: 03/13/2015 15:00    Assessment & Plan:   There are no diagnoses linked to this encounter. I am having Ms. Heminger maintain her magnesium hydroxide, Cholecalciferol, ipratropium, KLOR-CON, LORazepam, acetaminophen, and KLOR-CON.  No orders of the defined types were placed in this encounter.    Follow-up: No Follow-up on file.  Walker Kehr, MD

## 2016-09-02 NOTE — Assessment & Plan Note (Signed)
BP Readings from Last 3 Encounters:  09/02/16 122/78  07/17/16 127/62  05/28/16 128/84  on diet

## 2016-09-02 NOTE — Assessment & Plan Note (Signed)
Wt Readings from Last 3 Encounters:  09/02/16 93 lb (42.2 kg)  07/17/16 95 lb (43.1 kg)  05/28/16 94 lb 1.3 oz (42.7 kg)

## 2016-09-02 NOTE — Assessment & Plan Note (Signed)
Lorazepam prn  Potential benefits of a long term benzodiazepines use as well as potential risks (i.e. addiction risk, apnea etc) and complications (i.e. Somnolence, constipation and others) were explained to the patient and were aknowledged.

## 2016-09-08 DIAGNOSIS — H04123 Dry eye syndrome of bilateral lacrimal glands: Secondary | ICD-10-CM | POA: Diagnosis not present

## 2016-09-08 DIAGNOSIS — H52203 Unspecified astigmatism, bilateral: Secondary | ICD-10-CM | POA: Diagnosis not present

## 2016-09-08 DIAGNOSIS — H5203 Hypermetropia, bilateral: Secondary | ICD-10-CM | POA: Diagnosis not present

## 2016-09-08 DIAGNOSIS — H524 Presbyopia: Secondary | ICD-10-CM | POA: Diagnosis not present

## 2016-09-08 DIAGNOSIS — Z961 Presence of intraocular lens: Secondary | ICD-10-CM | POA: Diagnosis not present

## 2016-10-01 ENCOUNTER — Ambulatory Visit (INDEPENDENT_AMBULATORY_CARE_PROVIDER_SITE_OTHER): Payer: Medicare Other | Admitting: Internal Medicine

## 2016-10-01 ENCOUNTER — Encounter: Payer: Self-pay | Admitting: Internal Medicine

## 2016-10-01 DIAGNOSIS — H524 Presbyopia: Secondary | ICD-10-CM | POA: Diagnosis not present

## 2016-10-01 DIAGNOSIS — G301 Alzheimer's disease with late onset: Secondary | ICD-10-CM

## 2016-10-01 DIAGNOSIS — G309 Alzheimer's disease, unspecified: Secondary | ICD-10-CM

## 2016-10-01 DIAGNOSIS — F0280 Dementia in other diseases classified elsewhere without behavioral disturbance: Secondary | ICD-10-CM | POA: Insufficient documentation

## 2016-10-01 DIAGNOSIS — H5213 Myopia, bilateral: Secondary | ICD-10-CM | POA: Diagnosis not present

## 2016-10-01 DIAGNOSIS — F0281 Dementia in other diseases classified elsewhere with behavioral disturbance: Secondary | ICD-10-CM | POA: Diagnosis not present

## 2016-10-01 DIAGNOSIS — H52203 Unspecified astigmatism, bilateral: Secondary | ICD-10-CM | POA: Diagnosis not present

## 2016-10-01 MED ORDER — DONEPEZIL HCL 5 MG PO TABS: 5.0000 mg | ORAL_TABLET | Freq: Every day | ORAL | 11 refills | Status: DC

## 2016-10-01 MED ORDER — B COMPLEX PO TABS: 1.0000 | ORAL_TABLET | Freq: Every day | ORAL | 3 refills | Status: AC

## 2016-10-01 NOTE — Assessment & Plan Note (Signed)
w/ paranoid features Starting Aricept - low dose Neurol ref per family request

## 2016-10-01 NOTE — Progress Notes (Signed)
Subjective:  Patient ID: Lindsey Dickson, female    DOB: [birth year]  Age: 83 y.o. MRN: [id number]  CC: No chief complaint on file.   HPI TUMEKA CHIMENTI presents for HTN C/o R HA Memory issues per dtr are worse: paranoid about money, other people steeling stuff, anger  Outpatient Medications Prior to Visit  Medication Sig Dispense Refill  . acetaminophen (TYLENOL) 325 MG tablet Take 650 mg by mouth every 6 (six) hours as needed.    . Cholecalciferol 1000 UNITS tablet Take 1,000 Units by mouth daily.      [name] ipratropium (ATROVENT) 0.06 % nasal spray Place 2 sprays into both nostrils 4 (four) times daily. 15 mL 1  . KLOR-CON 8 MEQ tablet TAKE 1 TABLET (8 MEQ TOTAL) BY MOUTH DAILY. 30 tablet 5  . LORazepam (ATIVAN) 0.5 MG tablet Take 2-4 tablets (1-2 mg total) by mouth at bedtime as needed for anxiety or sleep. 90 tablet 3  . magnesium hydroxide (MILK OF MAGNESIA) 400 MG/5ML suspension Take 7.5 mLs by mouth daily as needed for mild constipation. Reported on 06/25/2015     No facility-administered medications prior to visit.     ROS Review of Systems  Constitutional: Positive for fatigue. Negative for activity change, appetite change, chills and unexpected weight change.  HENT: Negative for congestion, mouth sores and sinus pressure.   Eyes: Negative for visual disturbance.  Respiratory: Negative for cough and chest tightness.   Gastrointestinal: Negative for abdominal pain and nausea.  Genitourinary: Negative for difficulty urinating, frequency and vaginal pain.  Musculoskeletal: Negative for back pain and gait problem.  Skin: Negative for pallor and rash.  Neurological: Negative for dizziness, tremors, weakness, numbness and headaches.  Psychiatric/Behavioral: Negative for confusion and sleep disturbance. The patient is nervous/anxious.     Objective:  BP 128/72 (BP Location: Left Arm, Patient Position: Sitting, Cuff Size: Normal)   Pulse 75   Temp 97.9 F (36.6 C) (Oral)   Ht 5\' 1"   (1.549 m)   Wt 92 lb (41.7 kg)   SpO2 99%   BMI 17.38 kg/m   BP Readings from Last 3 Encounters:  10/01/16 128/72  09/02/16 122/78  07/17/16 127/62    Wt Readings from Last 3 Encounters:  10/01/16 92 lb (41.7 kg)  09/02/16 93 lb (42.2 kg)  07/17/16 95 lb (43.1 kg)    Physical Exam  Constitutional: She appears well-developed. No distress.  HENT:  Head: Normocephalic.  Right Ear: External ear normal.  Left Ear: External ear normal.  Nose: Nose normal.  Mouth/Throat: Oropharynx is clear and moist.  Eyes: Pupils are equal, round, and reactive to light. Conjunctivae are normal. Right eye exhibits no discharge. Left eye exhibits no discharge.  Neck: Normal range of motion. Neck supple. No JVD present. No tracheal deviation present. No thyromegaly present.  Cardiovascular: Normal rate, regular rhythm and normal heart sounds.   Pulmonary/Chest: No stridor. No respiratory distress. She has no wheezes.  Abdominal: Soft. Bowel sounds are normal. She exhibits no distension and no mass. There is no tenderness. There is no rebound and no guarding.  Musculoskeletal: She exhibits no edema or tenderness.  Lymphadenopathy:    She has no cervical adenopathy.  Neurological: She displays normal reflexes. No cranial nerve deficit. She exhibits normal muscle tone. Coordination normal.  Skin: No rash noted. No erythema.  Psychiatric: She has a normal mood and affect. Her behavior is normal. Judgment and thought content normal.  alert and cooperative  I spent total of >  20 minutes face-to-face with patient and greater than 50% was spent counseling and or coordinating care - we discussed memory loss issues w/pt and her dtr  Lab Results  Component Value Date   WBC 6.1 09/02/2016   HGB 13.6 09/02/2016   HCT 42.3 09/02/2016   PLT 278.0 09/02/2016   GLUCOSE 120 (H) 09/02/2016   CHOL 213 (H) 09/02/2016   TRIG 157.0 (H) 09/02/2016   HDL 47.60 09/02/2016   LDLDIRECT 177.6 12/20/2009   LDLCALC 134  (H) 09/02/2016   ALT 9 09/02/2016   AST 17 09/02/2016   NA 141 09/02/2016   K 3.5 09/02/2016   CL 104 09/02/2016   CREATININE 0.96 09/02/2016   BUN 13 09/02/2016   CO2 30 09/02/2016   TSH 0.60 09/02/2016    Dg Chest 2 View  Result Date: 03/13/2015 CLINICAL DATA:  Cough, fever. EXAM: CHEST  2 VIEW COMPARISON:  None. FINDINGS: Hyperinflation of the lungs. Heart and mediastinal contours are within normal limits. No focal opacities or effusions. No acute bony abnormality. IMPRESSION: Hyperinflation.  No active disease. Electronically Signed   By: Rolm Baptise M.D.   On: 03/13/2015 15:00    Assessment & Plan:   There are no diagnoses linked to this encounter. I am having Ms. Mattson maintain her magnesium hydroxide, Cholecalciferol, ipratropium, acetaminophen, KLOR-CON, and LORazepam.  No orders of the defined types were placed in this encounter.    Follow-up: No Follow-up on file.  Walker Kehr, MD

## 2016-10-02 ENCOUNTER — Encounter: Payer: Self-pay | Admitting: Neurology

## 2016-10-13 ENCOUNTER — Telehealth: Payer: Self-pay | Admitting: Internal Medicine

## 2016-10-13 NOTE — Telephone Encounter (Signed)
E NOTE: All timestamps contained within this report are represented as [place] Standard Time. CONFIDENTIALTY NOTICE: This fax transmission is intended only for the addressee. It contains information that is legally privileged, confidential or otherwise protected from use or disclosure. If you are not the intended recipient, you are strictly prohibited from reviewing, disclosing, copying using or disseminating any of this information or taking any action in reliance on or regarding this information. If you have received this fax in error, please notify us immediately by telephone so that we can arrange for its return to [place]. Phone: [phone], Toll-Free: [phone], Fax: [phone] Page: 1 of 1 Call Id: [id number] [facility] - Client [place] Patient Name: Lindsey Dickson DOB: Dec 14, 1920 Initial Comment Caller states, having pain around her heart. Nurse Assessment Nurse: Dimas Chyle, RN, Dellis Filbert Date/Time Eilene Ghazi Time): 10/13/2016 2:22:40 PM Confirm and document reason for call. If symptomatic, describe symptoms. ---Caller states, having pain around her heart. Chest pain started this morning. Pain comes and goes. Does the patient have any new or worsening symptoms? ---Yes Will a triage be completed? ---Yes Related visit to physician within the last 2 weeks? ---No Does the PT have any chronic conditions? (i.e. diabetes, asthma, etc.) ---No Is this a behavioral health or substance abuse call? ---No Guidelines Guideline Title Affirmed Question Affirmed Notes Chest Pain [1] Intermittent chest pain from "angina" AND [2] NO increase in severity or frequency Final Disposition User See PCP When Office is Open (within 3 days) Dimas Chyle, RN, [employer] Referrals REFERRED TO PCP OFFICE Caller Disagree/Comply Comply Caller Understands Yes PreDisposition Did not know what to do

## 2016-10-14 ENCOUNTER — Ambulatory Visit (INDEPENDENT_AMBULATORY_CARE_PROVIDER_SITE_OTHER): Payer: Medicare Other | Admitting: Internal Medicine

## 2016-10-14 ENCOUNTER — Other Ambulatory Visit (INDEPENDENT_AMBULATORY_CARE_PROVIDER_SITE_OTHER): Payer: Medicare Other

## 2016-10-14 ENCOUNTER — Encounter: Payer: Self-pay | Admitting: Internal Medicine

## 2016-10-14 ENCOUNTER — Ambulatory Visit (INDEPENDENT_AMBULATORY_CARE_PROVIDER_SITE_OTHER)
Admission: RE | Admit: 2016-10-14 | Discharge: 2016-10-14 | Disposition: A | Discharge status: Home / Self Care | Payer: Medicare Other | Source: Ambulatory Visit | Attending: Internal Medicine | Admitting: Internal Medicine

## 2016-10-14 VITALS — BP 138/82 | HR 74 | Temp 98.2°F | Ht 61.0 in | Wt 89.0 lb

## 2016-10-14 DIAGNOSIS — R079 Chest pain, unspecified: Secondary | ICD-10-CM

## 2016-10-14 DIAGNOSIS — G301 Alzheimer's disease with late onset: Secondary | ICD-10-CM

## 2016-10-14 DIAGNOSIS — F0281 Dementia in other diseases classified elsewhere with behavioral disturbance: Secondary | ICD-10-CM | POA: Diagnosis not present

## 2016-10-14 DIAGNOSIS — R0789 Other chest pain: Secondary | ICD-10-CM | POA: Diagnosis not present

## 2016-10-14 DIAGNOSIS — F02818 Dementia in other diseases classified elsewhere, unspecified severity, with other behavioral disturbance: Secondary | ICD-10-CM

## 2016-10-14 LAB — HEPATIC FUNCTION PANEL
ALT: 9 U/L (ref 0–35)
AST: 17 U/L (ref 0–37)
Albumin: 3.7 g/dL (ref 3.5–5.2)
Alkaline Phosphatase: 84 U/L (ref 39–117)
BILIRUBIN DIRECT: 0 mg/dL (ref 0.0–0.3)
BILIRUBIN TOTAL: 0.4 mg/dL (ref 0.2–1.2)
Total Protein: 7.5 g/dL (ref 6.0–8.3)

## 2016-10-14 LAB — CBC WITH DIFFERENTIAL/PLATELET
BASOS ABS: 0 10*3/uL (ref 0.0–0.1)
Basophils Relative: 0.9 % (ref 0.0–3.0)
EOS ABS: 0.3 10*3/uL (ref 0.0–0.7)
EOS PCT: 6 % — AB (ref 0.0–5.0)
HCT: 41.6 % (ref 36.0–46.0)
HEMOGLOBIN: 13.6 g/dL (ref 12.0–15.0)
LYMPHS ABS: 1.6 10*3/uL (ref 0.7–4.0)
Lymphocytes Relative: 33.1 % (ref 12.0–46.0)
MCHC: 32.6 g/dL (ref 30.0–36.0)
MCV: 87.3 fl (ref 78.0–100.0)
MONO ABS: 0.6 10*3/uL (ref 0.1–1.0)
Monocytes Relative: 12 % (ref 3.0–12.0)
NEUTROS PCT: 48 % (ref 43.0–77.0)
Neutro Abs: 2.3 10*3/uL (ref 1.4–7.7)
Platelets: 266 10*3/uL (ref 150.0–400.0)
RBC: 4.76 Mil/uL (ref 3.87–5.11)
RDW: 13.9 % (ref 11.5–15.5)
WBC: 4.9 10*3/uL (ref 4.0–10.5)

## 2016-10-14 LAB — BASIC METABOLIC PANEL
BUN: 10 mg/dL (ref 6–23)
CALCIUM: 9.5 mg/dL (ref 8.4–10.5)
CO2: 34 mEq/L — ABNORMAL HIGH (ref 19–32)
CREATININE: 0.99 mg/dL (ref 0.40–1.20)
Chloride: 101 mEq/L (ref 96–112)
GFR: 55.43 mL/min — AB (ref >60.00)
GLUCOSE: 106 mg/dL — AB (ref 70–99)
POTASSIUM: 3.5 meq/L (ref 3.5–5.1)
Sodium: 138 mEq/L (ref 135–145)

## 2016-10-14 LAB — TROPONIN I: TNIDX: 0.01 ug/L (ref 0.00–0.06)

## 2016-10-14 MED ORDER — ESOMEPRAZOLE MAGNESIUM 20 MG PO CPDR
20.0000 mg | DELAYED_RELEASE_CAPSULE | Freq: Every day | ORAL | 0 refills | Status: DC
Start: 2016-10-14 — End: 2016-12-26

## 2016-10-14 NOTE — Progress Notes (Signed)
Subjective:  Patient ID: [name], female    DOB: September 03, 1920  Age: 83 y.o. MRN: [id number]  CC: No chief complaint on file.   HPI SHANTORIA ELLWOOD presents w/her dtr c/o L CP off and on; last one was yesterday, lasted all night. She is a poor historian: confused in timing of her sx's. The pt thought it was due to Vit B complex pills. She took [place] - it helped.  Pt has not started Aricept yet. No CP now  Outpatient Medications Prior to Visit  Medication Sig Dispense Refill  . acetaminophen (TYLENOL) 325 MG tablet Take 650 mg by mouth every 6 (six) hours as needed.    [name] b complex vitamins tablet Take 1 tablet by mouth daily. 100 tablet 3  . Cholecalciferol 1000 UNITS tablet Take 1,000 Units by mouth daily.      [name] donepezil (ARICEPT) 5 MG tablet Take 1 tablet (5 mg total) by mouth at bedtime. 30 tablet 11  . ipratropium (ATROVENT) 0.06 % nasal spray Place 2 sprays into both nostrils 4 (four) times daily. 15 mL 1  . KLOR-CON 8 MEQ tablet TAKE 1 TABLET (8 MEQ TOTAL) BY MOUTH DAILY. 30 tablet 5  . LORazepam (ATIVAN) 0.5 MG tablet Take 2-4 tablets (1-2 mg total) by mouth at bedtime as needed for anxiety or sleep. 90 tablet 3  . magnesium hydroxide (MILK OF MAGNESIA) 400 MG/5ML suspension Take 7.5 mLs by mouth daily as needed for mild constipation. Reported on 06/25/2015     No facility-administered medications prior to visit.     ROS Review of Systems  Constitutional: Negative for activity change, appetite change, chills, fatigue and unexpected weight change.  HENT: Negative for congestion, mouth sores and sinus pressure.   Eyes: Negative for visual disturbance.  Respiratory: Positive for chest tightness. Negative for cough and shortness of breath.   Cardiovascular: Positive for chest pain. Negative for palpitations and leg swelling.  Gastrointestinal: Positive for nausea. Negative for abdominal pain and vomiting.  Genitourinary: Negative for difficulty urinating, frequency and vaginal  pain.  Musculoskeletal: Negative for back pain and gait problem.  Skin: Negative for pallor and rash.  Neurological: Negative for dizziness, tremors, weakness, numbness and headaches.  Psychiatric/Behavioral: Positive for confusion and decreased concentration. Negative for sleep disturbance.    Objective:  BP 138/82 (BP Location: Left Arm, Patient Position: Sitting, Cuff Size: Normal)   Pulse 74   Temp 98.2 F (36.8 C) (Oral)   Ht 5\' 1"  (1.549 m)   Wt 89 lb (40.4 kg)   SpO2 99%   BMI 16.82 kg/m   BP Readings from Last 3 Encounters:  10/14/16 138/82  10/01/16 128/72  09/02/16 122/78    Wt Readings from Last 3 Encounters:  10/14/16 89 lb (40.4 kg)  10/01/16 92 lb (41.7 kg)  09/02/16 93 lb (42.2 kg)    Physical Exam  Constitutional: She appears well-developed. No distress.  HENT:  Head: Normocephalic.  Right Ear: External ear normal.  Left Ear: External ear normal.  Nose: Nose normal.  Mouth/Throat: Oropharynx is clear and moist.  Eyes: Pupils are equal, round, and reactive to light. Conjunctivae are normal. Right eye exhibits no discharge. Left eye exhibits no discharge.  Neck: Normal range of motion. Neck supple. No JVD present. No tracheal deviation present. No thyromegaly present.  Cardiovascular: Normal rate, regular rhythm and normal heart sounds.   Pulmonary/Chest: No stridor. No respiratory distress. She has no wheezes.  Abdominal: Soft. Bowel sounds are normal. She  exhibits no distension and no mass. There is no tenderness. There is no rebound and no guarding.  Musculoskeletal: She exhibits no edema or tenderness.  Lymphadenopathy:    She has no cervical adenopathy.  Neurological: She displays normal reflexes. No cranial nerve deficit. She exhibits normal muscle tone. Coordination normal.  Skin: No rash noted. No erythema.  Psychiatric: She has a normal mood and affect. Her behavior is normal.  Thin  Procedure: EKG Indication: chest pain Impression: NSR. No  changes compare to 2016.   Lab Results  Component Value Date   WBC 6.1 09/02/2016   HGB 13.6 09/02/2016   HCT 42.3 09/02/2016   PLT 278.0 09/02/2016   GLUCOSE 120 (H) 09/02/2016   CHOL 213 (H) 09/02/2016   TRIG 157.0 (H) 09/02/2016   HDL 47.60 09/02/2016   LDLDIRECT 177.6 12/20/2009   LDLCALC 134 (H) 09/02/2016   ALT 9 09/02/2016   AST 17 09/02/2016   NA 141 09/02/2016   K 3.5 09/02/2016   CL 104 09/02/2016   CREATININE 0.96 09/02/2016   BUN 13 09/02/2016   CO2 30 09/02/2016   TSH 0.60 09/02/2016    Dg Chest 2 View  Result Date: 03/13/2015 CLINICAL DATA:  Cough, fever. EXAM: CHEST  2 VIEW COMPARISON:  None. FINDINGS: Hyperinflation of the lungs. Heart and mediastinal contours are within normal limits. No focal opacities or effusions. No acute bony abnormality. IMPRESSION: Hyperinflation.  No active disease. Electronically Signed   By: Rolm Baptise M.D.   On: 03/13/2015 15:00    Assessment & Plan:   There are no diagnoses linked to this encounter. I am having Ms. Gassen maintain her magnesium hydroxide, Cholecalciferol, ipratropium, acetaminophen, KLOR-CON, LORazepam, donepezil, and b complex vitamins.  No orders of the defined types were placed in this encounter.    Follow-up: No Follow-up on file.  Walker Kehr, MD

## 2016-10-14 NOTE — Assessment & Plan Note (Signed)
Pt has not started Aricept yet

## 2016-10-14 NOTE — Assessment & Plan Note (Addendum)
?  etiology EKG, CXR, Labs To ER if relapsed D/c Vit B complex Chewable kids vitamins OTC Nexium

## 2016-10-14 NOTE — Patient Instructions (Addendum)
Chewable kids vitamins Go to ER if chest pain relapsed

## 2016-11-11 ENCOUNTER — Ambulatory Visit (INDEPENDENT_AMBULATORY_CARE_PROVIDER_SITE_OTHER): Payer: Medicare Other | Admitting: Internal Medicine

## 2016-11-11 ENCOUNTER — Encounter: Payer: Self-pay | Admitting: Internal Medicine

## 2016-11-11 ENCOUNTER — Telehealth: Payer: Self-pay | Admitting: Internal Medicine

## 2016-11-11 DIAGNOSIS — M7061 Trochanteric bursitis, right hip: Secondary | ICD-10-CM

## 2016-11-11 DIAGNOSIS — M7070 Other bursitis of hip, unspecified hip: Secondary | ICD-10-CM | POA: Insufficient documentation

## 2016-11-11 MED ORDER — METHYLPREDNISOLONE ACETATE 40 MG/ML IJ SUSP
40.0000 mg | Freq: Once | INTRAMUSCULAR | Status: AC
  Administered 2016-11-11: 40 mg via INTRA_ARTICULAR

## 2016-11-11 MED ORDER — LORAZEPAM 0.5 MG PO TABS: 1.0000 mg | ORAL_TABLET | Freq: Every evening | ORAL | 3 refills | Status: DC | PRN

## 2016-11-11 NOTE — Patient Instructions (Addendum)
Hip Bursitis Hip bursitis is inflammation of a fluid-filled sac (bursa) in the hip joint. The bursa protects the bones in the hip joint from rubbing against each other. Hip bursitis can cause mild to moderate pain, and symptoms often come and go over time. What are the causes? This condition may be caused by:  Injury to the hip.  Overuse of the muscles that surround the hip joint.  Arthritis or gout.  Diabetes.  Thyroid disease.  Cold weather.  Infection.  In some cases, the cause may not be known. What are the signs or symptoms? Symptoms of this condition may include:  Mild or moderate pain in the hip area. Pain may get worse with movement.  Tenderness and swelling of the hip, especially on the outer side of the hip.  Symptoms may come and go. If the bursa becomes infected, you may have the following symptoms:  Fever.  Red skin and a feeling of warmth in the hip area.  How is this diagnosed? This condition may be diagnosed based on:  A physical exam.  Your medical history.  X-rays.  Removal of fluid from your inflamed bursa for testing (biopsy).  You may be sent to a health care provider who specializes in bone diseases (orthopedist) or a provider who specializes in joint inflammation (rheumatologist). How is this treated? This condition is treated by resting, raising (elevating), and applying pressure(compression) to the injured area. In some cases, this may be enough to make your symptoms go away. Treatment may also include:  Crutches.  Antibiotic medicine.  Draining fluid out of the bursa to help relieve swelling.  Injecting medicine that helps to reduce inflammation (cortisone).  Follow these instructions at home: Medicines  Take over-the-counter and prescription medicines only as told by your health care provider.  Do not drive or operate heavy machinery while taking prescription pain medicine, or as told by your health care provider.  If you were  prescribed an antibiotic, take it as told by your health care provider. Do not stop taking the antibiotic even if you start to feel better. Activity  Return to your normal activities as told by your health care provider. Ask your health care provider what activities are safe for you.  Rest and protect your hip as much as possible until your pain and swelling get better. General instructions  Wear compression wraps only as told by your health care provider.  Elevate your hip above the level of your heart as much as you can without pain. To do this, try putting a pillow under your hips while you lie down.  Do not use your hip to support your body weight until your health care provider says that you can. Use crutches as told by your health care provider.  Gently massage and stretch your injured area as often as is comfortable.  Keep all follow-up visits as told by your health care provider. This is important. How is this prevented?  Exercise regularly, as told by your health care provider.  Warm up and stretch before being active.  Cool down and stretch after being active.  If an activity irritates your hip or causes pain, avoid the activity as much as possible.  Avoid sitting down for long periods at a time. Contact a health care provider if:  You have a fever.  You develop new symptoms.  You have difficulty walking or doing everyday activities.  You have pain that gets worse or does not get better with medicine.  You  develop red skin or a feeling of warmth in your hip area. Get help right away if:  You cannot move your hip.  You have severe pain. This information is not intended to replace advice given to you by your health care provider. Make sure you discuss any questions you have with your health care provider. Document Released: 06/28/2001 Document Revised: 06/14/2015 Document Reviewed: 08/08/2014 Elsevier Interactive Patient Education  2018 [place].   IT  band stretching Massage Ice/heat Memory foam pad for your bed

## 2016-11-11 NOTE — Progress Notes (Signed)
Subjective:  Patient ID: Lindsey Dickson, female    DOB: 08-16-1920  Age: 83 y.o. MRN: [id number]  CC: No chief complaint on file.   HPI Lindsey Dickson presents for R leg pain in the thigh x2 weeks (off and on x years) No recent falls.    Outpatient Medications Prior to Visit  Medication Sig Dispense Refill  . acetaminophen (TYLENOL) 325 MG tablet Take 650 mg by mouth every 6 (six) hours as needed.    [name] b complex vitamins tablet Take 1 tablet by mouth daily. 100 tablet 3  . Cholecalciferol 1000 UNITS tablet Take 1,000 Units by mouth daily.      [name] donepezil (ARICEPT) 5 MG tablet Take 1 tablet (5 mg total) by mouth at bedtime. 30 tablet 11  . esomeprazole (NEXIUM) 20 MG capsule Take 1 capsule (20 mg total) by mouth daily at 12 noon. 30 capsule 0  . ipratropium (ATROVENT) 0.06 % nasal spray Place 2 sprays into both nostrils 4 (four) times daily. 15 mL 1  . KLOR-CON 8 MEQ tablet TAKE 1 TABLET (8 MEQ TOTAL) BY MOUTH DAILY. 30 tablet 5  . LORazepam (ATIVAN) 0.5 MG tablet Take 2-4 tablets (1-2 mg total) by mouth at bedtime as needed for anxiety or sleep. 90 tablet 3  . magnesium hydroxide (MILK OF MAGNESIA) 400 MG/5ML suspension Take 7.5 mLs by mouth daily as needed for mild constipation. Reported on 06/25/2015     No facility-administered medications prior to visit.     ROS Review of Systems  Constitutional: Negative for activity change, appetite change, chills, fatigue and unexpected weight change.  HENT: Negative for congestion, mouth sores and sinus pressure.   Eyes: Negative for visual disturbance.  Respiratory: Negative for cough and chest tightness.   Gastrointestinal: Negative for abdominal pain and nausea.  Genitourinary: Negative for difficulty urinating, frequency and vaginal pain.  Musculoskeletal: Positive for arthralgias and gait problem. Negative for back pain.  Skin: Negative for pallor and rash.  Neurological: Negative for dizziness, tremors, weakness, numbness and  headaches.  Psychiatric/Behavioral: Negative for confusion and sleep disturbance.    Objective:  BP 132/74 (BP Location: Left Arm, Patient Position: Sitting, Cuff Size: Normal)   Pulse 72   Temp 97.9 F (36.6 C) (Oral)   Ht 5\' 1"  (1.549 m)   Wt 94 lb (42.6 kg)   SpO2 99%   BMI 17.76 kg/m   BP Readings from Last 3 Encounters:  11/11/16 132/74  10/14/16 138/82  10/01/16 128/72    Wt Readings from Last 3 Encounters:  11/11/16 94 lb (42.6 kg)  10/14/16 89 lb (40.4 kg)  10/01/16 92 lb (41.7 kg)    Physical Exam  Constitutional: She appears well-developed. No distress.  HENT:  Head: Normocephalic.  Right Ear: External ear normal.  Left Ear: External ear normal.  Nose: Nose normal.  Mouth/Throat: Oropharynx is clear and moist.  Eyes: Pupils are equal, round, and reactive to light. Conjunctivae are normal. Right eye exhibits no discharge. Left eye exhibits no discharge.  Neck: Normal range of motion. Neck supple. No JVD present. No tracheal deviation present. No thyromegaly present.  Cardiovascular: Normal rate, regular rhythm and normal heart sounds.   Pulmonary/Chest: No stridor. No respiratory distress. She has no wheezes.  Abdominal: Soft. Bowel sounds are normal. She exhibits no distension and no mass. There is no tenderness. There is no rebound and no guarding.  Musculoskeletal: She exhibits tenderness. She exhibits no edema.  Lymphadenopathy:    She has  no cervical adenopathy.  Neurological: She displays normal reflexes. No cranial nerve deficit. She exhibits normal muscle tone. Coordination normal.  Skin: No rash noted. No erythema.  Psychiatric: She has a normal mood and affect. Her behavior is normal. Judgment and thought content normal.  R hip (Lat trochanter) and R IT band is tender to palpation   Procedure Note :     Procedure : Joint Injection,  R  hip   Indication:  Trochanteric bursitis with refractory  chronic pain.   Risks including unsuccessful  procedure , bleeding, infection, bruising, skin atrophy, "steroid flare-up" and others were explained to the patient in detail as well as the benefits. Informed consent was obtained and signed.   Tthe patient was placed in a comfortable lateral decubitus position. The point of maximal tenderness was identified. Skin was prepped with Betadine and alcohol. Then, a 5 cc syringe with a 2 inch long 24-gauge needle was used for a bursa injection.. The needle was advanced  Into the bursa. I injected the bursa with 4 mL of 2% lidocaine and 40 mg of Depo-Medrol .  Band-Aid was applied.   Tolerated well. Complications: None. Good pain relief following the procedure.      Lab Results  Component Value Date   WBC 4.9 10/14/2016   HGB 13.6 10/14/2016   HCT 41.6 10/14/2016   PLT 266.0 10/14/2016   GLUCOSE 106 (H) 10/14/2016   CHOL 213 (H) 09/02/2016   TRIG 157.0 (H) 09/02/2016   HDL 47.60 09/02/2016   LDLDIRECT 177.6 12/20/2009   LDLCALC 134 (H) 09/02/2016   ALT 9 10/14/2016   AST 17 10/14/2016   NA 138 10/14/2016   K 3.5 10/14/2016   CL 101 10/14/2016   CREATININE 0.99 10/14/2016   BUN 10 10/14/2016   CO2 34 (H) 10/14/2016   TSH 0.60 09/02/2016    Dg Chest 2 View  Result Date: 10/15/2016 CLINICAL DATA:  Left-sided chest pain for the past 2-3 days. History of hypertension and CHF. EXAM: CHEST  2 VIEW COMPARISON:  PA and lateral chest x-ray of March 13, 2015 FINDINGS: The lungs are mildly hyperinflated and clear there is no pneumothorax or pneumomediastinum or pleural effusion. There is chronic elevation of the posterior aspect of the left hemidiaphragm. The heart and pulmonary vascularity are normal. The mediastinum is normal in width. There is calcification in the wall of the thoracic aorta. There is a sclerotic focus in the body of T12 which is stable. IMPRESSION: Hyperinflation consistent with reactive airway disease or COPD. No pneumonia nor CHF. Thoracic aortic atherosclerosis.  Electronically Signed   By: Dickson  [place] M.D.   On: 10/15/2016 08:59    Assessment & Plan:   There are no diagnoses linked to this encounter. I am having Ms. Wailes maintain her magnesium hydroxide, Cholecalciferol, ipratropium, acetaminophen, KLOR-CON, LORazepam, donepezil, b complex vitamins, and esomeprazole.  No orders of the defined types were placed in this encounter.    Follow-up: No Follow-up on file.  Walker Kehr, MD

## 2016-11-11 NOTE — Telephone Encounter (Signed)
Pt called and needs a refill of her LORazepam (ATIVAN) 0.5 MG tablet  Please advise and call back

## 2016-11-11 NOTE — Assessment & Plan Note (Signed)
IT band stretching See injection procedure Massage Ice/heat

## 2016-11-11 NOTE — Telephone Encounter (Signed)
Ok Thx 

## 2016-11-14 ENCOUNTER — Ambulatory Visit: Payer: Medicare Other | Admitting: Family Medicine

## 2016-12-08 ENCOUNTER — Encounter: Payer: Self-pay | Admitting: Internal Medicine

## 2016-12-08 ENCOUNTER — Ambulatory Visit (INDEPENDENT_AMBULATORY_CARE_PROVIDER_SITE_OTHER): Payer: Medicare Other | Admitting: Internal Medicine

## 2016-12-08 ENCOUNTER — Ambulatory Visit: Payer: Medicare Other | Admitting: Internal Medicine

## 2016-12-08 DIAGNOSIS — R21 Rash and other nonspecific skin eruption: Secondary | ICD-10-CM

## 2016-12-08 DIAGNOSIS — G301 Alzheimer's disease with late onset: Secondary | ICD-10-CM | POA: Diagnosis not present

## 2016-12-08 DIAGNOSIS — K581 Irritable bowel syndrome with constipation: Secondary | ICD-10-CM | POA: Diagnosis not present

## 2016-12-08 DIAGNOSIS — M7061 Trochanteric bursitis, right hip: Secondary | ICD-10-CM

## 2016-12-08 DIAGNOSIS — F0281 Dementia in other diseases classified elsewhere with behavioral disturbance: Secondary | ICD-10-CM | POA: Diagnosis not present

## 2016-12-08 NOTE — Assessment & Plan Note (Signed)
Discussed w/pt Not sure if she is compliant w/Rx

## 2016-12-08 NOTE — Progress Notes (Signed)
Subjective:  Patient ID: Lindsey Dickson, female    DOB: 1920-08-18  Age: 83 y.o. MRN: [id number]  CC: No chief complaint on file.   HPI PA TENNANT presents for for an appointment. A lady brought me... Her dtr is not here w/her. C/o spots on the forehead. C/o R leg pain since she fell 7 years ago. C/o pain on the R lat thigh.  Outpatient Medications Prior to Visit  Medication Sig Dispense Refill  . acetaminophen (TYLENOL) 325 MG tablet Take 650 mg by mouth every 6 (six) hours as needed.    [name] b complex vitamins tablet Take 1 tablet by mouth daily. 100 tablet 3  . Cholecalciferol 1000 UNITS tablet Take 1,000 Units by mouth daily.      [name] donepezil (ARICEPT) 5 MG tablet Take 1 tablet (5 mg total) by mouth at bedtime. 30 tablet 11  . esomeprazole (NEXIUM) 20 MG capsule Take 1 capsule (20 mg total) by mouth daily at 12 noon. 30 capsule 0  . ipratropium (ATROVENT) 0.06 % nasal spray Place 2 sprays into both nostrils 4 (four) times daily. 15 mL 1  . KLOR-CON 8 MEQ tablet TAKE 1 TABLET (8 MEQ TOTAL) BY MOUTH DAILY. 30 tablet 5  . LORazepam (ATIVAN) 0.5 MG tablet Take 2-4 tablets (1-2 mg total) by mouth at bedtime as needed for anxiety or sleep. 90 tablet 3  . magnesium hydroxide (MILK OF MAGNESIA) 400 MG/5ML suspension Take 7.5 mLs by mouth daily as needed for mild constipation. Reported on 06/25/2015     No facility-administered medications prior to visit.     ROS Review of Systems  Constitutional: Positive for fatigue. Negative for activity change, appetite change, chills and unexpected weight change.  HENT: Negative for congestion, mouth sores and sinus pressure.   Eyes: Negative for visual disturbance.  Respiratory: Negative for cough and chest tightness.   Gastrointestinal: Negative for abdominal pain and nausea.  Genitourinary: Negative for difficulty urinating, frequency and vaginal pain.  Musculoskeletal: Positive for arthralgias. Negative for back pain and gait problem.  Skin:  Positive for rash. Negative for pallor.  Neurological: Negative for dizziness, tremors, weakness, numbness and headaches.  Psychiatric/Behavioral: Positive for confusion. Negative for sleep disturbance.    Objective:  BP 130/80 (BP Location: Left Arm, Patient Position: Sitting, Cuff Size: Normal)   Pulse 86   Temp 97.8 F (36.6 C) (Oral)   Ht 5\' 1"  (1.549 m)   Wt 91 lb (41.3 kg)   SpO2 98%   BMI 17.19 kg/m   BP Readings from Last 3 Encounters:  12/08/16 130/80  11/11/16 132/74  10/14/16 138/82    Wt Readings from Last 3 Encounters:  12/08/16 91 lb (41.3 kg)  11/11/16 94 lb (42.6 kg)  10/14/16 89 lb (40.4 kg)    Physical Exam  Constitutional: She appears well-developed. No distress.  HENT:  Head: Normocephalic.  Right Ear: External ear normal.  Left Ear: External ear normal.  Nose: Nose normal.  Mouth/Throat: Oropharynx is clear and moist.  Eyes: Conjunctivae are normal. Pupils are equal, round, and reactive to light. Right eye exhibits no discharge. Left eye exhibits no discharge.  Neck: Normal range of motion. Neck supple. No JVD present. No tracheal deviation present. No thyromegaly present.  Cardiovascular: Normal rate, regular rhythm and normal heart sounds.  Pulmonary/Chest: No stridor. No respiratory distress. She has no wheezes.  Abdominal: Soft. Bowel sounds are normal. She exhibits no distension and no mass. There is no tenderness. There is no  rebound and no guarding.  Musculoskeletal: She exhibits no edema or tenderness.  Lymphadenopathy:    She has no cervical adenopathy.  Neurological: She displays normal reflexes. No cranial nerve deficit. She exhibits normal muscle tone. Coordination normal.  Skin: No rash noted. No erythema.  Psychiatric: She has a normal mood and affect. Her behavior is normal.   Today is Tuesday, February 1926. I forget a lot of things...  FTF >20 min   Lab Results  Component Value Date   WBC 4.9 10/14/2016   HGB 13.6  10/14/2016   HCT 41.6 10/14/2016   PLT 266.0 10/14/2016   GLUCOSE 106 (H) 10/14/2016   CHOL 213 (H) 09/02/2016   TRIG 157.0 (H) 09/02/2016   HDL 47.60 09/02/2016   LDLDIRECT 177.6 12/20/2009   LDLCALC 134 (H) 09/02/2016   ALT 9 10/14/2016   AST 17 10/14/2016   NA 138 10/14/2016   K 3.5 10/14/2016   CL 101 10/14/2016   CREATININE 0.99 10/14/2016   BUN 10 10/14/2016   CO2 34 (H) 10/14/2016   TSH 0.60 09/02/2016    Dg Chest 2 View  Result Date: 10/15/2016 CLINICAL DATA:  Left-sided chest pain for the past 2-3 days. History of hypertension and CHF. EXAM: CHEST  2 VIEW COMPARISON:  PA and lateral chest x-ray of March 13, 2015 FINDINGS: The lungs are mildly hyperinflated and clear there is no pneumothorax or pneumomediastinum or pleural effusion. There is chronic elevation of the posterior aspect of the left hemidiaphragm. The heart and pulmonary vascularity are normal. The mediastinum is normal in width. There is calcification in the wall of the thoracic aorta. There is a sclerotic focus in the body of T12 which is stable. IMPRESSION: Hyperinflation consistent with reactive airway disease or COPD. No pneumonia nor CHF. Thoracic aortic atherosclerosis. Electronically Signed   By: Dickson  Martinique M.D.   On: 10/15/2016 08:59    Assessment & Plan:   There are no diagnoses linked to this encounter. I am having Lorien M. Tranchina maintain her magnesium hydroxide, Cholecalciferol, ipratropium, acetaminophen, KLOR-CON, donepezil, b complex vitamins, esomeprazole, and LORazepam.  No orders of the defined types were placed in this encounter.    Follow-up: No Follow-up on file.  Walker Kehr, MD

## 2016-12-08 NOTE — Assessment & Plan Note (Signed)
R hip; IT band: Take Tylenol 500 mg 2-3 times a day for leg pain. Massage it. Use heat

## 2016-12-08 NOTE — Assessment & Plan Note (Signed)
Discussed OTC meds

## 2016-12-08 NOTE — Patient Instructions (Addendum)
You can use over-the counter Cortaid 1% cream on the forehead once a day.  Take Tylenol 500 mg 2-3 times a day for leg pain. Massage it. Use heat

## 2016-12-08 NOTE — Assessment & Plan Note (Addendum)
Mild - 1-2 mm raised bumps, hyperkeratotic You can use over-the counter Cortaid 1% cream on the forehead once a day.

## 2016-12-15 ENCOUNTER — Ambulatory Visit: Payer: Medicare Other | Admitting: Internal Medicine

## 2016-12-24 ENCOUNTER — Ambulatory Visit: Payer: Medicare Other | Admitting: Neurology

## 2016-12-26 ENCOUNTER — Ambulatory Visit (INDEPENDENT_AMBULATORY_CARE_PROVIDER_SITE_OTHER): Payer: Medicare Other | Admitting: Internal Medicine

## 2016-12-26 ENCOUNTER — Encounter: Payer: Self-pay | Admitting: Internal Medicine

## 2016-12-26 DIAGNOSIS — E876 Hypokalemia: Secondary | ICD-10-CM | POA: Diagnosis not present

## 2016-12-26 DIAGNOSIS — R413 Other amnesia: Secondary | ICD-10-CM | POA: Diagnosis not present

## 2016-12-26 DIAGNOSIS — F5101 Primary insomnia: Secondary | ICD-10-CM | POA: Diagnosis not present

## 2016-12-26 MED ORDER — DONEPEZIL HCL 5 MG PO TABS: 5.0000 mg | ORAL_TABLET | Freq: Every day | ORAL | 11 refills | Status: DC

## 2016-12-26 MED ORDER — ESOMEPRAZOLE MAGNESIUM 20 MG PO CPDR: 20.0000 mg | DELAYED_RELEASE_CAPSULE | Freq: Every day | ORAL | 0 refills | Status: DC | PRN

## 2016-12-26 NOTE — Assessment & Plan Note (Signed)
Chronic Lorazepam prn  Potential benefits of a long term benzodiazepines use as well as potential risks (i.e. addiction risk, apnea etc) and complications (i.e. Somnolence, constipation and others) were explained to the patient and were aknowledged.

## 2016-12-26 NOTE — Progress Notes (Signed)
Subjective:  Patient ID: Lindsey Dickson, female    DOB: [birth year]  Age: 83 y.o. MRN: [id number]  CC: No chief complaint on file.   HPI MARJEAN IMPERATO presents for HTN, memory loss, GERD f/u  Outpatient Medications Prior to Visit  Medication Sig Dispense Refill  . acetaminophen (TYLENOL) 325 MG tablet Take 650 mg by mouth every 6 (six) hours as needed.    [name] b complex vitamins tablet Take 1 tablet by mouth daily. 100 tablet 3  . Cholecalciferol 1000 UNITS tablet Take 1,000 Units by mouth daily.      [name] esomeprazole (NEXIUM) 20 MG capsule Take 1 capsule (20 mg total) by mouth daily at 12 noon. 30 capsule 0  . KLOR-CON 8 MEQ tablet TAKE 1 TABLET (8 MEQ TOTAL) BY MOUTH DAILY. 30 tablet 5  . LORazepam (ATIVAN) 0.5 MG tablet Take 2-4 tablets (1-2 mg total) by mouth at bedtime as needed for anxiety or sleep. 90 tablet 3  . magnesium hydroxide (MILK OF MAGNESIA) 400 MG/5ML suspension Take 7.5 mLs by mouth daily as needed for mild constipation. Reported on 06/25/2015    . ipratropium (ATROVENT) 0.06 % nasal spray Place 2 sprays into both nostrils 4 (four) times daily. 15 mL 1  . donepezil (ARICEPT) 5 MG tablet Take 1 tablet (5 mg total) by mouth at bedtime. (Patient not taking: Reported on 12/26/2016) 30 tablet 11   No facility-administered medications prior to visit.     ROS Review of Systems  Constitutional: Negative for activity change, appetite change, chills, fatigue and unexpected weight change.  HENT: Negative for congestion, mouth sores and sinus pressure.   Eyes: Negative for visual disturbance.  Respiratory: Negative for cough and chest tightness.   Gastrointestinal: Negative for abdominal pain and nausea.  Genitourinary: Negative for difficulty urinating, frequency and vaginal pain.  Musculoskeletal: Negative for back pain and gait problem.  Skin: Negative for pallor and rash.  Neurological: Negative for dizziness, tremors, weakness, numbness and headaches.  Psychiatric/Behavioral:  Positive for confusion and decreased concentration. Negative for sleep disturbance and suicidal ideas. The patient is nervous/anxious.     Objective:  BP (!) 142/80 (BP Location: Left Arm, Patient Position: Sitting, Cuff Size: Normal)   Pulse 76   Temp 97.8 F (36.6 C) (Oral)   Ht 5\' 1"  (1.549 m)   Wt 92 lb (41.7 kg)   SpO2 100%   BMI 17.38 kg/m   BP Readings from Last 3 Encounters:  12/26/16 (!) 142/80  12/08/16 130/80  11/11/16 132/74    Wt Readings from Last 3 Encounters:  12/26/16 92 lb (41.7 kg)  12/08/16 91 lb (41.3 kg)  11/11/16 94 lb (42.6 kg)    Physical Exam  Constitutional: She appears well-developed. No distress.  HENT:  Head: Normocephalic.  Right Ear: External ear normal.  Left Ear: External ear normal.  Nose: Nose normal.  Mouth/Throat: Oropharynx is clear and moist.  Eyes: Conjunctivae are normal. Pupils are equal, round, and reactive to light. Right eye exhibits no discharge. Left eye exhibits no discharge.  Neck: Normal range of motion. Neck supple. No JVD present. No tracheal deviation present. No thyromegaly present.  Cardiovascular: Normal rate, regular rhythm and normal heart sounds.  Pulmonary/Chest: No stridor. No respiratory distress. She has no wheezes.  Abdominal: Soft. Bowel sounds are normal. She exhibits no distension and no mass. There is no tenderness. There is no rebound and no guarding.  Musculoskeletal: She exhibits no edema or tenderness.  Lymphadenopathy:  She has no cervical adenopathy.  Neurological: She displays normal reflexes. No cranial nerve deficit. She exhibits normal muscle tone. Coordination normal.  Skin: No rash noted. No erythema.  Psychiatric: She has a normal mood and affect. Her behavior is normal. Thought content normal.    Lab Results  Component Value Date   WBC 4.9 10/14/2016   HGB 13.6 10/14/2016   HCT 41.6 10/14/2016   PLT 266.0 10/14/2016   GLUCOSE 106 (H) 10/14/2016   CHOL 213 (H) 09/02/2016   TRIG  157.0 (H) 09/02/2016   HDL 47.60 09/02/2016   LDLDIRECT 177.6 12/20/2009   LDLCALC 134 (H) 09/02/2016   ALT 9 10/14/2016   AST 17 10/14/2016   NA 138 10/14/2016   K 3.5 10/14/2016   CL 101 10/14/2016   CREATININE 0.99 10/14/2016   BUN 10 10/14/2016   CO2 34 (H) 10/14/2016   TSH 0.60 09/02/2016    Dg Chest 2 View  Result Date: 10/15/2016 CLINICAL DATA:  Left-sided chest pain for the past 2-3 days. History of hypertension and CHF. EXAM: CHEST  2 VIEW COMPARISON:  PA and lateral chest x-ray of March 13, 2015 FINDINGS: The lungs are mildly hyperinflated and clear there is no pneumothorax or pneumomediastinum or pleural effusion. There is chronic elevation of the posterior aspect of the left hemidiaphragm. The heart and pulmonary vascularity are normal. The mediastinum is normal in width. There is calcification in the wall of the thoracic aorta. There is a sclerotic focus in the body of T12 which is stable. IMPRESSION: Hyperinflation consistent with reactive airway disease or COPD. No pneumonia nor CHF. Thoracic aortic atherosclerosis. Electronically Signed   By: Dickson  [place] M.D.   On: 10/15/2016 08:59    Assessment & Plan:   There are no diagnoses linked to this encounter. I have discontinued Montserrath M. Loper's ipratropium and donepezil. I am also having her maintain her magnesium hydroxide, Cholecalciferol, acetaminophen, KLOR-CON, b complex vitamins, esomeprazole, and LORazepam.  No orders of the defined types were placed in this encounter.    Follow-up: No Follow-up on file.  Walker Kehr, MD

## 2016-12-26 NOTE — Assessment & Plan Note (Addendum)
Pt will try Aricept 5 mg/hs Gummy bears MVI

## 2016-12-26 NOTE — Assessment & Plan Note (Signed)
On KCl 

## 2017-01-02 ENCOUNTER — Ambulatory Visit: Payer: Medicare Other | Admitting: Internal Medicine

## 2017-01-06 ENCOUNTER — Other Ambulatory Visit: Payer: Self-pay | Admitting: Internal Medicine

## 2017-01-06 NOTE — Telephone Encounter (Signed)
Attempted to cal pt but no answer. Pt needs to contact pharmacy for refill. Last prescription was written 10/18 for 3 refills.

## 2017-01-06 NOTE — Telephone Encounter (Signed)
Copied from [place] [id number]. Topic: Quick Communication - Rx Refill/Question >> Jan 06, 2017 11:29 AM Robina Ade, Helene Kelp D wrote: Has the patient contacted their pharmacy? Yes (Agent: If no, request that the patient contact the pharmacy for the refill.) Preferred Pharmacy (with phone number or street name): [id number] - , [place]: Please be advised that RX refills may take up to 3 business days. We ask that you follow-up with your pharmacy. Patient needs refill on her  LORazepam (ATIVAN) 0.5 MG tablet . Patient is completley out.

## 2017-01-30 ENCOUNTER — Ambulatory Visit: Payer: Medicare Other | Admitting: Internal Medicine

## 2017-02-22 ENCOUNTER — Other Ambulatory Visit: Payer: Self-pay | Admitting: Internal Medicine

## 2017-03-06 ENCOUNTER — Ambulatory Visit (INDEPENDENT_AMBULATORY_CARE_PROVIDER_SITE_OTHER): Payer: Medicare Other | Admitting: Internal Medicine

## 2017-03-06 ENCOUNTER — Encounter: Payer: Self-pay | Admitting: Internal Medicine

## 2017-03-06 DIAGNOSIS — F0281 Dementia in other diseases classified elsewhere with behavioral disturbance: Secondary | ICD-10-CM | POA: Diagnosis not present

## 2017-03-06 DIAGNOSIS — G301 Alzheimer's disease with late onset: Secondary | ICD-10-CM | POA: Diagnosis not present

## 2017-03-06 DIAGNOSIS — I10 Essential (primary) hypertension: Secondary | ICD-10-CM

## 2017-03-06 DIAGNOSIS — F419 Anxiety disorder, unspecified: Secondary | ICD-10-CM

## 2017-03-06 DIAGNOSIS — R634 Abnormal weight loss: Secondary | ICD-10-CM

## 2017-03-06 MED ORDER — LORAZEPAM 0.5 MG PO TABS: 1.0000 mg | ORAL_TABLET | Freq: Every evening | ORAL | 3 refills | Status: DC | PRN

## 2017-03-06 NOTE — Progress Notes (Signed)
Subjective:  Patient ID: Lindsey Dickson, female    DOB: 01-Sep-1920  Age: 84 y.o. MRN: [id number]  CC: No chief complaint on file.   HPI Lindsey Dickson presents for memory loss, anxiety. Pt stopped Aricept ?hurt in the stomach... F/u GERD  Outpatient Medications Prior to Visit  Medication Sig Dispense Refill  . acetaminophen (TYLENOL) 325 MG tablet Take 650 mg by mouth every 6 (six) hours as needed.    [name] b complex vitamins tablet Take 1 tablet by mouth daily. 100 tablet 3  . Cholecalciferol 1000 UNITS tablet Take 1,000 Units by mouth daily.      [name] donepezil (ARICEPT) 5 MG tablet Take 1 tablet (5 mg total) by mouth at bedtime. 30 tablet 11  . esomeprazole (NEXIUM) 20 MG capsule Take 1 capsule (20 mg total) by mouth daily as needed. 30 capsule 0  . LORazepam (ATIVAN) 0.5 MG tablet Take 2-4 tablets (1-2 mg total) by mouth at bedtime as needed for anxiety or sleep. 90 tablet 3  . magnesium hydroxide (MILK OF MAGNESIA) 400 MG/5ML suspension Take 7.5 mLs by mouth daily as needed for mild constipation. Reported on 06/25/2015    . potassium chloride (KLOR-CON) 8 MEQ tablet TAKE 1 TABLET BY MOUTH EVERY DAY 30 tablet 11   No facility-administered medications prior to visit.     ROS Review of Systems  Constitutional: Negative for activity change, appetite change, chills, fatigue and unexpected weight change.  HENT: Negative for congestion, mouth sores and sinus pressure.   Eyes: Negative for visual disturbance.  Respiratory: Negative for cough and chest tightness.   Gastrointestinal: Positive for abdominal pain. Negative for nausea.  Genitourinary: Negative for difficulty urinating, frequency and vaginal pain.  Musculoskeletal: Negative for back pain and gait problem.  Skin: Negative for pallor and rash.  Neurological: Negative for dizziness, tremors, weakness, numbness and headaches.  Psychiatric/Behavioral: Positive for confusion, decreased concentration and sleep disturbance. Negative for  suicidal ideas. The patient is nervous/anxious.     Objective:  BP 136/82 (BP Location: Left Arm, Patient Position: Sitting, Cuff Size: Large)   Pulse 77   Temp 98.1 F (36.7 C) (Oral)   Ht 5\' 1"  (1.549 m)   Wt 94 lb (42.6 kg)   SpO2 98%   BMI 17.76 kg/m   BP Readings from Last 3 Encounters:  03/06/17 136/82  12/26/16 (!) 142/80  12/08/16 130/80    Wt Readings from Last 3 Encounters:  03/06/17 94 lb (42.6 kg)  12/26/16 92 lb (41.7 kg)  12/08/16 91 lb (41.3 kg)    Physical Exam  Constitutional: She appears well-developed. No distress.  HENT:  Head: Normocephalic.  Right Ear: External ear normal.  Left Ear: External ear normal.  Nose: Nose normal.  Mouth/Throat: Oropharynx is clear and moist.  Eyes: Conjunctivae are normal. Pupils are equal, round, and reactive to light. Right eye exhibits no discharge. Left eye exhibits no discharge.  Neck: Normal range of motion. Neck supple. No JVD present. No tracheal deviation present. No thyromegaly present.  Cardiovascular: Normal rate, regular rhythm and normal heart sounds.  Pulmonary/Chest: No stridor. No respiratory distress. She has no wheezes.  Abdominal: Soft. Bowel sounds are normal. She exhibits no distension and no mass. There is no tenderness. There is no rebound and no guarding.  Musculoskeletal: She exhibits no edema or tenderness.  Lymphadenopathy:    She has no cervical adenopathy.  Neurological: She displays normal reflexes. No cranial nerve deficit. She exhibits normal muscle tone. Coordination normal.  Skin: No rash noted. No erythema.  Psychiatric: She has a normal mood and affect. Her behavior is normal. Judgment and thought content normal.  alert and cooperative  Lab Results  Component Value Date   WBC 4.9 10/14/2016   HGB 13.6 10/14/2016   HCT 41.6 10/14/2016   PLT 266.0 10/14/2016   GLUCOSE 106 (H) 10/14/2016   CHOL 213 (H) 09/02/2016   TRIG 157.0 (H) 09/02/2016   HDL 47.60 09/02/2016   LDLDIRECT  177.6 12/20/2009   LDLCALC 134 (H) 09/02/2016   ALT 9 10/14/2016   AST 17 10/14/2016   NA 138 10/14/2016   K 3.5 10/14/2016   CL 101 10/14/2016   CREATININE 0.99 10/14/2016   BUN 10 10/14/2016   CO2 34 (H) 10/14/2016   TSH 0.60 09/02/2016    Dg Chest 2 View  Result Date: 10/15/2016 CLINICAL DATA:  Left-sided chest pain for the past 2-3 days. History of hypertension and CHF. EXAM: CHEST  2 VIEW COMPARISON:  PA and lateral chest x-ray of March 13, 2015 FINDINGS: The lungs are mildly hyperinflated and clear there is no pneumothorax or pneumomediastinum or pleural effusion. There is chronic elevation of the posterior aspect of the left hemidiaphragm. The heart and pulmonary vascularity are normal. The mediastinum is normal in width. There is calcification in the wall of the thoracic aorta. There is a sclerotic focus in the body of T12 which is stable. IMPRESSION: Hyperinflation consistent with reactive airway disease or COPD. No pneumonia nor CHF. Thoracic aortic atherosclerosis. Electronically Signed   By: Dickson  [place] M.D.   On: 10/15/2016 08:59    Assessment & Plan:   There are no diagnoses linked to this encounter. I am having Jaquasha M. Yono maintain her magnesium hydroxide, Cholecalciferol, acetaminophen, b complex vitamins, LORazepam, esomeprazole, donepezil, and potassium chloride.  No orders of the defined types were placed in this encounter.    Follow-up: No Follow-up on file.  Walker Kehr, MD

## 2017-03-06 NOTE — Assessment & Plan Note (Signed)
NAS diet 

## 2017-03-06 NOTE — Assessment & Plan Note (Signed)
Lorazepam prn  Potential benefits of a long term benzodiazepines  use as well as potential risks  and complications were explained to the patient and were aknowledged.  

## 2017-03-06 NOTE — Assessment & Plan Note (Signed)
Wt Readings from Last 3 Encounters:  03/06/17 94 lb (42.6 kg)  12/26/16 92 lb (41.7 kg)  12/08/16 91 lb (41.3 kg)

## 2017-03-06 NOTE — Assessment & Plan Note (Signed)
Aricept - low dose 9/18 - pt stopped

## 2017-03-27 ENCOUNTER — Ambulatory Visit: Payer: Medicare Other | Admitting: Internal Medicine

## 2017-04-22 ENCOUNTER — Telehealth: Payer: Self-pay | Admitting: Internal Medicine

## 2017-04-22 NOTE — Telephone Encounter (Signed)
Spoke with Ms. Lindsey Dickson regarding AWV. Patient declined to schedule AWV at this time. SF

## 2017-05-02 ENCOUNTER — Ambulatory Visit: Payer: Medicare Other | Admitting: Family Medicine

## 2017-05-15 ENCOUNTER — Ambulatory Visit: Payer: Self-pay

## 2017-05-15 ENCOUNTER — Other Ambulatory Visit: Payer: Medicare Other

## 2017-05-15 ENCOUNTER — Ambulatory Visit (INDEPENDENT_AMBULATORY_CARE_PROVIDER_SITE_OTHER): Payer: Medicare Other | Admitting: Family

## 2017-05-15 ENCOUNTER — Ambulatory Visit: Payer: Medicare Other | Admitting: Internal Medicine

## 2017-05-15 ENCOUNTER — Encounter: Payer: Self-pay | Admitting: Family

## 2017-05-15 ENCOUNTER — Telehealth: Payer: Self-pay

## 2017-05-15 VITALS — BP 112/70 | HR 98 | Temp 98.0°F | Ht 61.0 in | Wt 93.0 lb

## 2017-05-15 DIAGNOSIS — Z113 Encounter for screening for infections with a predominantly sexual mode of transmission: Secondary | ICD-10-CM

## 2017-05-15 NOTE — Telephone Encounter (Signed)
Created CRM today incase patient's daughter Lindsey Dickson calls back. Mickel Baas called her today and left message for her to return call to clinic regarding non-urgent matter in regards to her mother. Should she return call to clinic, please have our office get Mickel Baas on the phone so she may speak with her.

## 2017-05-15 NOTE — Patient Instructions (Signed)
I have called your daughter, [name], to speak with her about our discussion today.  You can contact [employer] at [phone] to see about other options for apartments.
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
## 2017-05-16 LAB — HIV ANTIBODY (ROUTINE TESTING W REFLEX): HIV: NONREACTIVE

## 2017-05-18 LAB — RPR: RPR: NONREACTIVE

## 2017-05-20 LAB — GC/CHLAMYDIA PROBE AMP
Chlamydia trachomatis, NAA: NEGATIVE
NEISSERIA GONORRHOEAE BY PCR: NEGATIVE

## 2017-05-21 ENCOUNTER — Telehealth: Payer: Self-pay

## 2017-05-21 ENCOUNTER — Ambulatory Visit: Payer: Medicare Other | Admitting: Internal Medicine

## 2017-05-21 NOTE — Telephone Encounter (Signed)
error 

## 2017-05-21 NOTE — Telephone Encounter (Signed)
Patient's daughter returned call to clinic this morning; Number info given below:   Copied from [place]. Topic: Quick Communication - Office Called Patient >> May 15, 2017  4:23 PM Marcina Millard, CMA wrote: Reason for CRM: Mickel Baas called patient's daughter today, [name] and left message for her to return call to office for non-emergent matter. If she returns call to clinic while we are still here please have our office get Mickel Baas so she can speak with her regarding her mother. >> May 21, 2017  9:40 AM Percell Belt A wrote: Daughter called in and returning the call.    Best number  [phone] In the morning is the best time.

## 2017-05-22 NOTE — Telephone Encounter (Signed)
Attempted to reach patient's daughter, [name] again; had to leave VM; will call back early next week.

## 2017-05-22 NOTE — Telephone Encounter (Signed)
Lindsey Dickson is returning call. Best # is [phone]. She will keep phone with her.

## 2017-05-26 ENCOUNTER — Ambulatory Visit (INDEPENDENT_AMBULATORY_CARE_PROVIDER_SITE_OTHER): Payer: Medicare Other | Admitting: Internal Medicine

## 2017-05-26 ENCOUNTER — Encounter: Payer: Self-pay | Admitting: Internal Medicine

## 2017-05-26 ENCOUNTER — Telehealth: Payer: Self-pay | Admitting: Internal Medicine

## 2017-05-26 DIAGNOSIS — G301 Alzheimer's disease with late onset: Secondary | ICD-10-CM

## 2017-05-26 DIAGNOSIS — F0281 Dementia in other diseases classified elsewhere with behavioral disturbance: Secondary | ICD-10-CM

## 2017-05-26 DIAGNOSIS — F02818 Dementia in other diseases classified elsewhere, unspecified severity, with other behavioral disturbance: Secondary | ICD-10-CM

## 2017-05-26 MED ORDER — ARIPIPRAZOLE 2 MG PO TABS: 2.0000 mg | ORAL_TABLET | Freq: Every day | ORAL | 3 refills | Status: DC

## 2017-05-26 NOTE — Assessment & Plan Note (Signed)
2019 psychotic and paranoid features - worse Will try Abilify - low dose Home Care SW and RN consult to look at the options

## 2017-05-26 NOTE — Telephone Encounter (Signed)
Copied from [place] [phone]. Topic: Quick Communication - See Telephone Encounter >> May 26, 2017  1:52 PM Rutherford Nail, [place] wrote: CRM for notification. See Telephone encounter for: 05/26/17. Melissa with [facility] called and states that a referral for nursing and social work was sent. Melissa states that the patient is needing the dementia program that is run through the speech therapy department. She is requesting a revised referral to be for speech therapy and social work for the dementia program. CB#: [phone]
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
## 2017-05-27 NOTE — Telephone Encounter (Signed)
Copied from [place] [phone]. Topic: Quick Communication - See Telephone Encounter >> May 26, 2017  1:52 PM Rutherford Nail, [place] wrote: CRM for notification. See Telephone encounter for: 05/26/17. Melissa with [facility] called and states that a referral for nursing and social work was sent. Melissa states that the patient is needing the dementia program that is run through the speech therapy department. She is requesting a revised referral to be for speech therapy and social work for the dementia program. CB#: [phone] >> May 27, 2017  9:20 AM Boyd Kerbs wrote: Pt. Daughter, [name] called about referral for [facility] to set up appt. And go over options to her.   Hassan Rowan [phone] (h)  [phone] (c)

## 2017-05-28 ENCOUNTER — Telehealth: Payer: Self-pay | Admitting: Internal Medicine

## 2017-05-28 NOTE — Telephone Encounter (Signed)
Went over medication with daughter and let her know her mother as receiving the lowest dose possible

## 2017-05-28 NOTE — Telephone Encounter (Signed)
Copied from [place] [phone]. Topic: General - Other >> May 28, 2017 10:33 AM Margot Ables wrote: Reason for CRM: Requesting a call back from Jodi Mourning, NP regarding a screening decision they have made and would like to discuss with her.

## 2017-05-28 NOTE — Telephone Encounter (Signed)
Copied from [place]. Topic: Quick Communication - See Telephone Encounter >> May 28, 2017  8:46 AM Cleaster Corin, NT wrote: CRM for notification. See Telephone encounter for: 05/28/17.  Pt. Daughter [name] to speak with nurse about med. ARIPiprazole (ABILIFY) 2 MG tablet [[id number]] (black box alerts)

## 2017-05-29 NOTE — Telephone Encounter (Signed)
Left message to call back if further information was needed;

## 2017-06-02 NOTE — Telephone Encounter (Signed)
Referral made 

## 2017-07-02 ENCOUNTER — Ambulatory Visit (INDEPENDENT_AMBULATORY_CARE_PROVIDER_SITE_OTHER): Payer: Medicare Other | Admitting: Internal Medicine

## 2017-07-02 ENCOUNTER — Encounter: Payer: Self-pay | Admitting: Internal Medicine

## 2017-07-02 DIAGNOSIS — M79604 Pain in right leg: Secondary | ICD-10-CM | POA: Diagnosis not present

## 2017-07-02 DIAGNOSIS — I10 Essential (primary) hypertension: Secondary | ICD-10-CM | POA: Diagnosis not present

## 2017-07-02 DIAGNOSIS — G301 Alzheimer's disease with late onset: Secondary | ICD-10-CM

## 2017-07-02 DIAGNOSIS — F0281 Dementia in other diseases classified elsewhere with behavioral disturbance: Secondary | ICD-10-CM | POA: Diagnosis not present

## 2017-07-02 DIAGNOSIS — G8929 Other chronic pain: Secondary | ICD-10-CM | POA: Diagnosis not present

## 2017-07-02 DIAGNOSIS — M5440 Lumbago with sciatica, unspecified side: Secondary | ICD-10-CM

## 2017-07-02 DIAGNOSIS — F02818 Dementia in other diseases classified elsewhere, unspecified severity, with other behavioral disturbance: Secondary | ICD-10-CM

## 2017-07-02 NOTE — Assessment & Plan Note (Signed)
BP Readings from Last 3 Encounters:  07/02/17 128/82  05/26/17 116/78  05/15/17 112/70

## 2017-07-02 NOTE — Assessment & Plan Note (Signed)
Refusing meds

## 2017-07-02 NOTE — Assessment & Plan Note (Signed)
Resolved

## 2017-07-02 NOTE — Patient Instructions (Signed)
Heat, ice, massage, tennis ball for massaging; use Sports cream on it 2-3 times a day   Iliotibial Band Syndrome Iliotibial band syndrome (ITBS) is a condition that often causes knee pain. It can also cause pain in the outside of your hip, thigh, and knee. The iliotibial band is a strip of tissue that runs from the outside of your hip and down your thigh to the outside of your knee. Repeatedly bending and straightening your knee can irritate the iliotibial band. What are the causes? This condition is caused by inflammation and irritation from the friction of the iliotibial band moving over the thigh bone (femur) when you repeatedly bend and straighten your knee. What increases the risk? This condition is more likely to develop in people who:  Frequently change elevation during their workouts.  Run very long distances.  Recently increased the length or intensity of their workouts.  Run downhill often, or just started running downhill.  Ride a bike very far or often.  You may also be at greater risk if you start a new workout routine without first warming up or if you have a job that requires you to bend, squat, or climb frequently. What are the signs or symptoms? Symptoms of this condition include:  Pain along the outside of your knee that may be worse with activity, especially running or going up and down stairs.  A "snapping" sensation over your knee.  Swelling on the outside of your knee.  Pain or a feeling of tightness in your hip.  How is this diagnosed? This condition is diagnosed based on your symptoms, medical history, and physical exam. You may also see a health care provider who specializes in reducing pain and increasing mobility (physical therapist). A physical therapist may do an exam to check your balance, movement, and way of walking or running (gait) to see whether the way you move could contribute to your injury. You may also have tests to measure your strength,  flexibility, and range of motion. How is this treated? Treatment for this condition includes:  Resting and limiting exercise.  Returning to activities gradually.  Doing range-of-motion and strengthening exercises (physical therapy) as told by your health care provider.  Including low-impact activities, such as swimming, in your exercise routine.  Follow these instructions at home:  If directed, apply ice to the injured area. ? Put ice in a plastic bag. ? Place a towel between your skin and the bag. ? Leave the ice on for 20 minutes, 2-3 times per day.  Return to your normal activities as told by your health care provider. Ask your health care provider what activities are safe for you.  Keep all follow-up visits with your health care provider. This is important. Contact a health care provider if:  Your pain does not improve or gets worse despite treatment. This information is not intended to replace advice given to you by your health care provider. Make sure you discuss any questions you have with your health care provider. Document Released: 06/28/2001 Document Revised: 02/08/2016 Document Reviewed: 02/08/2016 Elsevier Interactive Patient Education  [employer].

## 2017-07-02 NOTE — Progress Notes (Signed)
Subjective:  Patient ID: Lindsey Dickson, female    DOB: [birth year]  Age: 84 y.o. MRN: [id number]  CC: No chief complaint on file.   HPI Lindsey Dickson presents for R leg pain of ?duration. The pt is a vague historian No LBP. No falls F/u dementia  Outpatient Medications Prior to Visit  Medication Sig Dispense Refill  . acetaminophen (TYLENOL) 325 MG tablet Take 650 mg by mouth every 6 (six) hours as needed.    . ARIPiprazole (ABILIFY) 2 MG tablet Take 1 tablet (2 mg total) by mouth daily. 30 tablet 3  . b complex vitamins tablet Take 1 tablet by mouth daily. 100 tablet 3  . Cholecalciferol 1000 UNITS tablet Take 1,000 Units by mouth daily.      [name] esomeprazole (NEXIUM) 20 MG capsule Take 1 capsule (20 mg total) by mouth daily as needed. 30 capsule 0  . LORazepam (ATIVAN) 0.5 MG tablet Take 2-4 tablets (1-2 mg total) by mouth at bedtime as needed for anxiety or sleep. 90 tablet 3  . magnesium hydroxide (MILK OF MAGNESIA) 400 MG/5ML suspension Take 7.5 mLs by mouth daily as needed for mild constipation. Reported on 06/25/2015    . potassium chloride (KLOR-CON) 8 MEQ tablet TAKE 1 TABLET BY MOUTH EVERY DAY 30 tablet 11   No facility-administered medications prior to visit.     ROS: Review of Systems  Constitutional: Negative for activity change, appetite change, chills, fatigue and unexpected weight change.  HENT: Negative for congestion, mouth sores and sinus pressure.   Eyes: Negative for visual disturbance.  Respiratory: Negative for cough and chest tightness.   Gastrointestinal: Negative for abdominal pain and nausea.  Genitourinary: Negative for difficulty urinating, frequency and vaginal pain.  Musculoskeletal: Positive for arthralgias and gait problem. Negative for back pain.  Skin: Negative for pallor and rash.  Neurological: Negative for dizziness, tremors, weakness, numbness and headaches.  Psychiatric/Behavioral: Positive for confusion and decreased concentration. Negative for  sleep disturbance.    Objective:  BP 128/82 (BP Location: Left Arm, Patient Position: Sitting, Cuff Size: Normal)   Pulse 75   Temp 97.6 F (36.4 C) (Oral)   Ht 5\' 1"  (1.549 m)   Wt 94 lb (42.6 kg)   SpO2 98%   BMI 17.76 kg/m   BP Readings from Last 3 Encounters:  07/02/17 128/82  05/26/17 116/78  05/15/17 112/70    Wt Readings from Last 3 Encounters:  07/02/17 94 lb (42.6 kg)  05/26/17 93 lb (42.2 kg)  05/15/17 93 lb 0.6 oz (42.2 kg)    Physical Exam  Constitutional: She appears well-developed. No distress.  HENT:  Head: Normocephalic.  Right Ear: External ear normal.  Left Ear: External ear normal.  Nose: Nose normal.  Mouth/Throat: Oropharynx is clear and moist.  Eyes: Pupils are equal, round, and reactive to light. Conjunctivae are normal. Right eye exhibits no discharge. Left eye exhibits no discharge.  Neck: Normal range of motion. Neck supple. No JVD present. No tracheal deviation present. No thyromegaly present.  Cardiovascular: Normal rate, regular rhythm and normal heart sounds.  Pulmonary/Chest: No stridor. No respiratory distress. She has no wheezes.  Abdominal: Soft. Bowel sounds are normal. She exhibits no distension and no mass. There is no tenderness. There is no rebound and no guarding.  Musculoskeletal: She exhibits tenderness. She exhibits no edema.  Lymphadenopathy:    She has no cervical adenopathy.  Neurological: She displays normal reflexes. No cranial nerve deficit. She exhibits normal muscle tone. Coordination  normal.  Skin: No rash noted. No erythema.  Psychiatric: She has a normal mood and affect. Her behavior is normal.  R IT band - tender  I spent total of >20 minutes face-to-face with patient and greater than 50% was spent counseling and or coordinating care - we discussed the need of a massage, noving   Lab Results  Component Value Date   WBC 4.9 10/14/2016   HGB 13.6 10/14/2016   HCT 41.6 10/14/2016   PLT 266.0 10/14/2016    GLUCOSE 106 (H) 10/14/2016   CHOL 213 (H) 09/02/2016   TRIG 157.0 (H) 09/02/2016   HDL 47.60 09/02/2016   LDLDIRECT 177.6 12/20/2009   LDLCALC 134 (H) 09/02/2016   ALT 9 10/14/2016   AST 17 10/14/2016   NA 138 10/14/2016   K 3.5 10/14/2016   CL 101 10/14/2016   CREATININE 0.99 10/14/2016   BUN 10 10/14/2016   CO2 34 (H) 10/14/2016   TSH 0.60 09/02/2016    Dg Chest 2 View  Result Date: 10/15/2016 CLINICAL DATA:  Left-sided chest pain for the past 2-3 days. History of hypertension and CHF. EXAM: CHEST  2 VIEW COMPARISON:  PA and lateral chest x-ray of March 13, 2015 FINDINGS: The lungs are mildly hyperinflated and clear there is no pneumothorax or pneumomediastinum or pleural effusion. There is chronic elevation of the posterior aspect of the left hemidiaphragm. The heart and pulmonary vascularity are normal. The mediastinum is normal in width. There is calcification in the wall of the thoracic aorta. There is a sclerotic focus in the body of T12 which is stable. IMPRESSION: Hyperinflation consistent with reactive airway disease or COPD. No pneumonia nor CHF. Thoracic aortic atherosclerosis. Electronically Signed   By: Dickson  Martinique M.D.   On: 10/15/2016 08:59    Assessment & Plan:   There are no diagnoses linked to this encounter.   No orders of the defined types were placed in this encounter.    Follow-up: No follow-ups on file.  Walker Kehr, MD

## 2017-07-02 NOTE — Assessment & Plan Note (Signed)
  IT band is tender  Heat, ice, massage, tennis ball for massaging; use Sports cream on it 2-3 times a day for Iliotibial Band Syndrome

## 2017-07-07 ENCOUNTER — Ambulatory Visit: Payer: Medicare Other | Admitting: Internal Medicine

## 2017-07-09 ENCOUNTER — Ambulatory Visit (INDEPENDENT_AMBULATORY_CARE_PROVIDER_SITE_OTHER): Payer: Medicare Other | Admitting: Internal Medicine

## 2017-07-09 ENCOUNTER — Encounter: Payer: Self-pay | Admitting: Internal Medicine

## 2017-07-09 DIAGNOSIS — S90122A Contusion of left lesser toe(s) without damage to nail, initial encounter: Secondary | ICD-10-CM

## 2017-07-09 DIAGNOSIS — F419 Anxiety disorder, unspecified: Secondary | ICD-10-CM

## 2017-07-09 DIAGNOSIS — M79604 Pain in right leg: Secondary | ICD-10-CM

## 2017-07-09 DIAGNOSIS — R443 Hallucinations, unspecified: Secondary | ICD-10-CM | POA: Diagnosis not present

## 2017-07-09 DIAGNOSIS — S90129A Contusion of unspecified lesser toe(s) without damage to nail, initial encounter: Secondary | ICD-10-CM | POA: Insufficient documentation

## 2017-07-09 MED ORDER — LORAZEPAM 0.5 MG PO TABS: 1.0000 mg | ORAL_TABLET | Freq: Every evening | ORAL | 3 refills | Status: DC | PRN

## 2017-07-09 NOTE — Assessment & Plan Note (Signed)
Better  

## 2017-07-09 NOTE — Assessment & Plan Note (Signed)
The pt is refusing Abilify

## 2017-07-09 NOTE — Assessment & Plan Note (Signed)
Tape to another toe X ray if worse

## 2017-07-09 NOTE — Assessment & Plan Note (Signed)
Lorazepam prn  Potential benefits of a long term benzodiazepines  use as well as potential risks  and complications were explained to the patient and were aknowledged.  

## 2017-07-09 NOTE — Progress Notes (Signed)
Subjective:  Patient ID: Lindsey Dickson, female    DOB: 1920-07-05  Age: 84 y.o. MRN: [id number]  CC: No chief complaint on file.   HPI Lindsey Dickson presents for R thigh pain - better. F/u HTN, memory loss. C/o L little toe pain x 1 week - ?bumped it  Outpatient Medications Prior to Visit  Medication Sig Dispense Refill  . acetaminophen (TYLENOL) 325 MG tablet Take 650 mg by mouth every 6 (six) hours as needed.    . ARIPiprazole (ABILIFY) 2 MG tablet Take 1 tablet (2 mg total) by mouth daily. 30 tablet 3  . b complex vitamins tablet Take 1 tablet by mouth daily. 100 tablet 3  . Cholecalciferol 1000 UNITS tablet Take 1,000 Units by mouth daily.      [name] esomeprazole (NEXIUM) 20 MG capsule Take 1 capsule (20 mg total) by mouth daily as needed. 30 capsule 0  . LORazepam (ATIVAN) 0.5 MG tablet Take 2-4 tablets (1-2 mg total) by mouth at bedtime as needed for anxiety or sleep. 90 tablet 3  . magnesium hydroxide (MILK OF MAGNESIA) 400 MG/5ML suspension Take 7.5 mLs by mouth daily as needed for mild constipation. Reported on 06/25/2015    . potassium chloride (KLOR-CON) 8 MEQ tablet TAKE 1 TABLET BY MOUTH EVERY DAY 30 tablet 11   No facility-administered medications prior to visit.     ROS: Review of Systems  Constitutional: Negative for activity change, appetite change, chills, fatigue and unexpected weight change.  HENT: Negative for congestion, mouth sores and sinus pressure.   Eyes: Negative for visual disturbance.  Respiratory: Negative for cough and chest tightness.   Gastrointestinal: Negative for abdominal pain and nausea.  Genitourinary: Negative for difficulty urinating, frequency and vaginal pain.  Musculoskeletal: Positive for gait problem. Negative for back pain.  Skin: Negative for pallor and rash.  Neurological: Negative for dizziness, tremors, weakness, numbness and headaches.  Psychiatric/Behavioral: Positive for behavioral problems, confusion and decreased concentration.  Negative for sleep disturbance and suicidal ideas. The patient is nervous/anxious.     Objective:  BP 132/88 (BP Location: Left Arm, Patient Position: Sitting, Cuff Size: Normal)   Pulse 85   Temp 98.4 F (36.9 C) (Oral)   Ht 5\' 1"  (1.549 m)   Wt 93 lb (42.2 kg)   SpO2 97%   BMI 17.57 kg/m   BP Readings from Last 3 Encounters:  07/09/17 132/88  07/02/17 128/82  05/26/17 116/78    Wt Readings from Last 3 Encounters:  07/09/17 93 lb (42.2 kg)  07/02/17 94 lb (42.6 kg)  05/26/17 93 lb (42.2 kg)    Physical Exam  Constitutional: She appears well-developed. No distress.  HENT:  Head: Normocephalic.  Right Ear: External ear normal.  Left Ear: External ear normal.  Nose: Nose normal.  Mouth/Throat: Oropharynx is clear and moist.  Eyes: Pupils are equal, round, and reactive to light. Conjunctivae are normal. Right eye exhibits no discharge. Left eye exhibits no discharge.  Neck: Normal range of motion. Neck supple. No JVD present. No tracheal deviation present. No thyromegaly present.  Cardiovascular: Normal rate, regular rhythm and normal heart sounds.  Pulmonary/Chest: No stridor. No respiratory distress. She has no wheezes.  Abdominal: Soft. Bowel sounds are normal. She exhibits no distension and no mass. There is no tenderness. There is no rebound and no guarding.  Musculoskeletal: She exhibits no edema or tenderness.  Lymphadenopathy:    She has no cervical adenopathy.  Neurological: She displays normal reflexes. No cranial  nerve deficit. She exhibits normal muscle tone. Coordination normal.  Skin: No rash noted. No erythema.  Psychiatric: She has a normal mood and affect. Her behavior is normal. Judgment and thought content normal.    Lab Results  Component Value Date   WBC 4.9 10/14/2016   HGB 13.6 10/14/2016   HCT 41.6 10/14/2016   PLT 266.0 10/14/2016   GLUCOSE 106 (H) 10/14/2016   CHOL 213 (H) 09/02/2016   TRIG 157.0 (H) 09/02/2016   HDL 47.60 09/02/2016    LDLDIRECT 177.6 12/20/2009   LDLCALC 134 (H) 09/02/2016   ALT 9 10/14/2016   AST 17 10/14/2016   NA 138 10/14/2016   K 3.5 10/14/2016   CL 101 10/14/2016   CREATININE 0.99 10/14/2016   BUN 10 10/14/2016   CO2 34 (H) 10/14/2016   TSH 0.60 09/02/2016    Dg Chest 2 View  Result Date: 10/15/2016 CLINICAL DATA:  Left-sided chest pain for the past 2-3 days. History of hypertension and CHF. EXAM: CHEST  2 VIEW COMPARISON:  PA and lateral chest x-ray of March 13, 2015 FINDINGS: The lungs are mildly hyperinflated and clear there is no pneumothorax or pneumomediastinum or pleural effusion. There is chronic elevation of the posterior aspect of the left hemidiaphragm. The heart and pulmonary vascularity are normal. The mediastinum is normal in width. There is calcification in the wall of the thoracic aorta. There is a sclerotic focus in the body of T12 which is stable. IMPRESSION: Hyperinflation consistent with reactive airway disease or COPD. No pneumonia nor CHF. Thoracic aortic atherosclerosis. Electronically Signed   By: Dickson  Lindsey M.D.   On: 10/15/2016 08:59    Assessment & Plan:   There are no diagnoses linked to this encounter.   No orders of the defined types were placed in this encounter.    Follow-up: No follow-ups on file.  Walker Kehr, MD

## 2017-07-14 ENCOUNTER — Other Ambulatory Visit: Payer: Self-pay | Admitting: Family

## 2017-08-24 ENCOUNTER — Other Ambulatory Visit: Payer: Self-pay | Admitting: Internal Medicine

## 2017-08-26 DIAGNOSIS — H524 Presbyopia: Secondary | ICD-10-CM | POA: Diagnosis not present

## 2017-08-26 DIAGNOSIS — H5203 Hypermetropia, bilateral: Secondary | ICD-10-CM | POA: Diagnosis not present

## 2017-08-26 DIAGNOSIS — H04123 Dry eye syndrome of bilateral lacrimal glands: Secondary | ICD-10-CM | POA: Diagnosis not present

## 2017-08-26 DIAGNOSIS — H52203 Unspecified astigmatism, bilateral: Secondary | ICD-10-CM | POA: Diagnosis not present

## 2017-08-26 DIAGNOSIS — Z961 Presence of intraocular lens: Secondary | ICD-10-CM | POA: Diagnosis not present

## 2017-08-31 ENCOUNTER — Telehealth: Payer: Self-pay | Admitting: Internal Medicine

## 2017-08-31 NOTE — Telephone Encounter (Signed)
Please advise 

## 2017-08-31 NOTE — Telephone Encounter (Signed)
Ok to renew [employer]

## 2017-08-31 NOTE — Telephone Encounter (Signed)
Copied from [place] [id number]. Topic: Quick Communication - See Telephone Encounter >> Aug 31, 2017  2:19 PM Blase Mess A wrote: CRM for notification. See Telephone encounter for: 08/31/17. Patient's daughter Lindsey Dickson called stating that her mother Lindsey Dickson got her LORazepam (ATIVAN) 0.5 MG tablet on August 24, 2017.  [name] has misplaced the medicine.  And is going to need another prescription for LORazepam (ATIVAN) 0.5 MG tablet.  Please advise. Call back for [facility] [phone]

## 2017-09-01 MED ORDER — LORAZEPAM 0.5 MG PO TABS: 1.0000 mg | ORAL_TABLET | Freq: Every evening | ORAL | 0 refills | Status: DC | PRN

## 2017-09-01 NOTE — Telephone Encounter (Signed)
RX faxed

## 2017-09-01 NOTE — Telephone Encounter (Signed)
Patient daughter, Hassan Rowan, calling to check on getting the prescription sent to the pharmacy.

## 2017-09-15 DIAGNOSIS — H353133 Nonexudative age-related macular degeneration, bilateral, advanced atrophic without subfoveal involvement: Secondary | ICD-10-CM | POA: Diagnosis not present

## 2017-09-15 DIAGNOSIS — Z961 Presence of intraocular lens: Secondary | ICD-10-CM | POA: Diagnosis not present

## 2017-09-15 DIAGNOSIS — H35371 Puckering of macula, right eye: Secondary | ICD-10-CM | POA: Diagnosis not present

## 2017-09-15 DIAGNOSIS — H26491 Other secondary cataract, right eye: Secondary | ICD-10-CM | POA: Diagnosis not present

## 2017-09-22 ENCOUNTER — Ambulatory Visit: Payer: Self-pay | Admitting: Internal Medicine

## 2017-09-22 NOTE — Telephone Encounter (Signed)
Pt c/o right breast and chest pain underneath right breath. She stated that she rolled off the bed Friday. Pt stated that she tried aspirin and epsom salt bath and alcohol and salve to the right breast and chest. Pt denies bruising, shortness of breath, or cuts or bruises. Pain is moderate in severity. Pt advised to ice the area and try a dose of Tylenol. Care advice given and pt given appointment for tomorrow with PCP.  Reason for Disposition . [1] High-risk adult (e.g., age > 49, osteoporosis, chronic steroid use) AND [2] still hurts  Answer Assessment - Initial Assessment Questions 1. MECHANISM: "How did the injury happen?"     Fell off bed  2. ONSET: "When did the injury happen?" (Minutes or hours ago)     Friday 3. LOCATION: "Where on the chest is the injury located?"      Right breast, chest hurts underneath breast from under breast to the right side 4. APPEARANCE: "What does the injury look like?"     Pt stated no bruising or cuts 5. BLEEDING: "Is there any bleeding now? If so, ask: How long has it been bleeding?"     no 6. SEVERITY: "Any difficulty with breathing?"     no 7. SIZE: For cuts, bruises, or swelling, ask: "How large is it?" (e.g., inches or centimeters)     n/a 8. PAIN: "Is there pain?" If so, ask: "How bad is the pain?"   (e.g., Scale 1-10; or mild, moderate, severe)     moderate 9. TETANUS: For any breaks in the skin, ask: "When was the last tetanus booster?"     n/a 10. PREGNANCY: "Is there any chance you are pregnant?" "When was your last menstrual period?"  Protocols used: CHEST INJURY-A-AH

## 2017-09-23 ENCOUNTER — Ambulatory Visit (INDEPENDENT_AMBULATORY_CARE_PROVIDER_SITE_OTHER)
Admission: RE | Admit: 2017-09-23 | Discharge: 2017-09-23 | Disposition: A | Discharge status: Home / Self Care | Payer: Medicare Other | Source: Ambulatory Visit | Attending: Internal Medicine | Admitting: Internal Medicine

## 2017-09-23 ENCOUNTER — Encounter: Payer: Self-pay | Admitting: Internal Medicine

## 2017-09-23 ENCOUNTER — Ambulatory Visit (INDEPENDENT_AMBULATORY_CARE_PROVIDER_SITE_OTHER): Payer: Medicare Other | Admitting: Internal Medicine

## 2017-09-23 DIAGNOSIS — S20211A Contusion of right front wall of thorax, initial encounter: Secondary | ICD-10-CM | POA: Diagnosis not present

## 2017-09-23 DIAGNOSIS — F0281 Dementia in other diseases classified elsewhere with behavioral disturbance: Secondary | ICD-10-CM | POA: Diagnosis not present

## 2017-09-23 DIAGNOSIS — I10 Essential (primary) hypertension: Secondary | ICD-10-CM

## 2017-09-23 DIAGNOSIS — G301 Alzheimer's disease with late onset: Secondary | ICD-10-CM | POA: Diagnosis not present

## 2017-09-23 DIAGNOSIS — S20219A Contusion of unspecified front wall of thorax, initial encounter: Secondary | ICD-10-CM | POA: Insufficient documentation

## 2017-09-23 DIAGNOSIS — F02818 Dementia in other diseases classified elsewhere, unspecified severity, with other behavioral disturbance: Secondary | ICD-10-CM

## 2017-09-23 NOTE — Progress Notes (Signed)
Subjective:  Patient ID: Lindsey Dickson, female    DOB: January 30, 1920  Age: 84 y.o. MRN: [id number]  CC: No chief complaint on file.   HPI [name] presents for CP on the R- fell off her bed on Fri last week F/u dementia, HTN  Outpatient Medications Prior to Visit  Medication Sig Dispense Refill  . acetaminophen (TYLENOL) 325 MG tablet Take 650 mg by mouth every 6 (six) hours as needed.    . ARIPiprazole (ABILIFY) 2 MG tablet Take 1 tablet (2 mg total) by mouth daily. 30 tablet 3  . b complex vitamins tablet Take 1 tablet by mouth daily. 100 tablet 3  . Cholecalciferol 1000 UNITS tablet Take 1,000 Units by mouth daily.      [name] esomeprazole (NEXIUM) 20 MG capsule Take 1 capsule (20 mg total) by mouth daily as needed. 30 capsule 0  . LORazepam (ATIVAN) 0.5 MG tablet Take 2-4 tablets (1-2 mg total) by mouth at bedtime as needed for anxiety or sleep. 90 tablet 0  . magnesium hydroxide (MILK OF MAGNESIA) 400 MG/5ML suspension Take 7.5 mLs by mouth daily as needed for mild constipation. Reported on 06/25/2015    . potassium chloride (KLOR-CON) 8 MEQ tablet TAKE 1 TABLET BY MOUTH EVERY DAY 30 tablet 11   No facility-administered medications prior to visit.     ROS: Review of Systems  Constitutional: Negative for activity change, appetite change, chills, fatigue and unexpected weight change.  HENT: Negative for congestion, mouth sores and sinus pressure.   Eyes: Negative for visual disturbance.  Respiratory: Negative for cough, chest tightness and shortness of breath.   Cardiovascular: Positive for chest pain.  Gastrointestinal: Negative for abdominal pain and nausea.  Genitourinary: Negative for difficulty urinating, frequency and vaginal pain.  Musculoskeletal: Negative for back pain and gait problem.  Skin: Negative for pallor and rash.  Neurological: Negative for dizziness, tremors, weakness, numbness and headaches.  Psychiatric/Behavioral: Positive for decreased concentration and  sleep disturbance. Negative for confusion and suicidal ideas.    Objective:  BP 110/78 (BP Location: Left Arm, Patient Position: Sitting, Cuff Size: Normal)   Pulse 83   Ht 5\' 1"  (1.549 m)   Wt 93 lb 12 oz (42.5 kg)   SpO2 96%   BMI 17.71 kg/m   BP Readings from Last 3 Encounters:  09/23/17 110/78  07/09/17 132/88  07/02/17 128/82    Wt Readings from Last 3 Encounters:  09/23/17 93 lb 12 oz (42.5 kg)  07/09/17 93 lb (42.2 kg)  07/02/17 94 lb (42.6 kg)    Physical Exam  Constitutional: She appears well-developed. No distress.  HENT:  Head: Normocephalic.  Right Ear: External ear normal.  Left Ear: External ear normal.  Nose: Nose normal.  Mouth/Throat: Oropharynx is clear and moist.  Eyes: Pupils are equal, round, and reactive to light. Conjunctivae are normal. Right eye exhibits no discharge. Left eye exhibits no discharge.  Neck: Normal range of motion. Neck supple. No JVD present. No tracheal deviation present. No thyromegaly present.  Cardiovascular: Normal rate, regular rhythm and normal heart sounds.  Pulmonary/Chest: No stridor. No respiratory distress. She has no wheezes.  Abdominal: Soft. Bowel sounds are normal. She exhibits no distension and no mass. There is no tenderness. There is no rebound and no guarding.  Musculoskeletal: She exhibits tenderness. She exhibits no edema.  Lymphadenopathy:    She has no cervical adenopathy.  Neurological: She displays normal reflexes. No cranial nerve deficit. She exhibits normal muscle tone.  Coordination normal.  Skin: No rash noted. No erythema.  Psychiatric: She has a normal mood and affect. Her behavior is normal. Judgment and thought content normal.  a/c R lat ribs tender w/palpation  Lab Results  Component Value Date   WBC 4.9 10/14/2016   HGB 13.6 10/14/2016   HCT 41.6 10/14/2016   PLT 266.0 10/14/2016   GLUCOSE 106 (H) 10/14/2016   CHOL 213 (H) 09/02/2016   TRIG 157.0 (H) 09/02/2016   HDL 47.60 09/02/2016    LDLDIRECT 177.6 12/20/2009   LDLCALC 134 (H) 09/02/2016   ALT 9 10/14/2016   AST 17 10/14/2016   NA 138 10/14/2016   K 3.5 10/14/2016   CL 101 10/14/2016   CREATININE 0.99 10/14/2016   BUN 10 10/14/2016   CO2 34 (H) 10/14/2016   TSH 0.60 09/02/2016    Dg Chest 2 View  Result Date: 10/15/2016 CLINICAL DATA:  Left-sided chest pain for the past 2-3 days. History of hypertension and CHF. EXAM: CHEST  2 VIEW COMPARISON:  PA and lateral chest x-ray of March 13, 2015 FINDINGS: The lungs are mildly hyperinflated and clear there is no pneumothorax or pneumomediastinum or pleural effusion. There is chronic elevation of the posterior aspect of the left hemidiaphragm. The heart and pulmonary vascularity are normal. The mediastinum is normal in width. There is calcification in the wall of the thoracic aorta. There is a sclerotic focus in the body of T12 which is stable. IMPRESSION: Hyperinflation consistent with reactive airway disease or COPD. No pneumonia nor CHF. Thoracic aortic atherosclerosis. Electronically Signed   By: Dickson  Martin[place] M.D.   On: 10/15/2016 08:59    Assessment & Plan:   There are no diagnoses linked to this encounter.   No orders of the defined types were placed in this encounter.    Follow-up: No follow-ups on file.  Walker Kehr, MD

## 2017-09-23 NOTE — Patient Instructions (Addendum)
Please lower your bed if possible

## 2017-09-23 NOTE — Assessment & Plan Note (Signed)
No change 

## 2017-09-23 NOTE — Assessment & Plan Note (Signed)
R side - fell off her bed on Fri last week Ice X ray

## 2017-09-23 NOTE — Assessment & Plan Note (Signed)
BP Readings from Last 3 Encounters:  09/23/17 110/78  07/09/17 132/88  07/02/17 128/82

## 2017-09-25 ENCOUNTER — Telehealth: Payer: Self-pay

## 2017-09-25 NOTE — Telephone Encounter (Signed)
Daughter notified 

## 2017-09-25 NOTE — Telephone Encounter (Signed)
Normal. No rib fx Thx

## 2017-09-25 NOTE — Telephone Encounter (Signed)
Please inform on xray results.  Copied from [place] [phone]. Topic: General - Other >> Sep 24, 2017  2:04 PM Valla Leaver wrote: Reason for CRM: Patients daughter Hassan Rowan calling for x-ray results. >> Sep 25, 2017  8:29 AM Scherrie Gerlach wrote: Daughter calling back for results of xray. Please call back

## 2017-09-29 ENCOUNTER — Ambulatory Visit: Payer: Medicare Other | Admitting: Family

## 2017-09-30 ENCOUNTER — Ambulatory Visit: Payer: Medicare Other | Admitting: Internal Medicine

## 2017-10-12 ENCOUNTER — Ambulatory Visit (INDEPENDENT_AMBULATORY_CARE_PROVIDER_SITE_OTHER): Payer: Medicare Other | Admitting: Internal Medicine

## 2017-10-12 ENCOUNTER — Encounter: Payer: Self-pay | Admitting: Internal Medicine

## 2017-10-12 DIAGNOSIS — G301 Alzheimer's disease with late onset: Secondary | ICD-10-CM | POA: Diagnosis not present

## 2017-10-12 DIAGNOSIS — F0281 Dementia in other diseases classified elsewhere with behavioral disturbance: Secondary | ICD-10-CM

## 2017-10-12 DIAGNOSIS — F02818 Dementia in other diseases classified elsewhere, unspecified severity, with other behavioral disturbance: Secondary | ICD-10-CM

## 2017-10-12 MED ORDER — LORAZEPAM 0.5 MG PO TABS: 0.5000 mg | ORAL_TABLET | Freq: Every evening | ORAL | 0 refills | Status: DC | PRN

## 2017-10-12 NOTE — Assessment & Plan Note (Signed)
Pt has psychotic and paranoid features - worse Abilify - not taking Aricept - low dose  - pt stopped Calling police at times, ambulance Not taking any meds except for Lorazepam prn

## 2017-10-12 NOTE — Progress Notes (Signed)
Subjective:  Patient ID: Lindsey Dickson, female    DOB: [birth year]  Age: 84 y.o. MRN: [id number]  CC: No chief complaint on file.   HPI TYAUNA LACAZE presents for vaginal itching - "something bit me" F/u dementia, anxiety, insomnia. Calling police at times, ambulance Not taking any meds except for Lorazepam prn  Comes w/dtr  Outpatient Medications Prior to Visit  Medication Sig Dispense Refill  . acetaminophen (TYLENOL) 325 MG tablet Take 650 mg by mouth every 6 (six) hours as needed.    . ARIPiprazole (ABILIFY) 2 MG tablet Take 1 tablet (2 mg total) by mouth daily. 30 tablet 3  . b complex vitamins tablet Take 1 tablet by mouth daily. 100 tablet 3  . Cholecalciferol 1000 UNITS tablet Take 1,000 Units by mouth daily.      [name] esomeprazole (NEXIUM) 20 MG capsule Take 1 capsule (20 mg total) by mouth daily as needed. 30 capsule 0  . LORazepam (ATIVAN) 0.5 MG tablet Take 2-4 tablets (1-2 mg total) by mouth at bedtime as needed for anxiety or sleep. 90 tablet 0  . magnesium hydroxide (MILK OF MAGNESIA) 400 MG/5ML suspension Take 7.5 mLs by mouth daily as needed for mild constipation. Reported on 06/25/2015    . potassium chloride (KLOR-CON) 8 MEQ tablet TAKE 1 TABLET BY MOUTH EVERY DAY 30 tablet 11   No facility-administered medications prior to visit.     ROS: Review of Systems  Constitutional: Negative for activity change, appetite change, chills, fatigue and unexpected weight change.  HENT: Negative for congestion, mouth sores and sinus pressure.   Eyes: Negative for visual disturbance.  Respiratory: Negative for cough and chest tightness.   Gastrointestinal: Negative for abdominal pain and nausea.  Genitourinary: Negative for difficulty urinating, frequency and vaginal pain.  Musculoskeletal: Negative for back pain and gait problem.  Skin: Negative for pallor and rash.  Neurological: Negative for dizziness, tremors, weakness, numbness and headaches.  Psychiatric/Behavioral: Positive  for confusion and decreased concentration. Negative for sleep disturbance and suicidal ideas. The patient is nervous/anxious.     Objective:  BP 138/84 (BP Location: Left Arm, Patient Position: Sitting, Cuff Size: Normal)   Pulse 82   Temp 97.9 F (36.6 C) (Oral)   Ht 5\' 1"  (1.549 m)   Wt 93 lb (42.2 kg)   SpO2 98%   BMI 17.57 kg/m   BP Readings from Last 3 Encounters:  10/12/17 138/84  09/23/17 110/78  07/09/17 132/88    Wt Readings from Last 3 Encounters:  10/12/17 93 lb (42.2 kg)  09/23/17 93 lb 12 oz (42.5 kg)  07/09/17 93 lb (42.2 kg)    Physical Exam  Constitutional: She appears well-developed. No distress.  HENT:  Head: Normocephalic.  Right Ear: External ear normal.  Left Ear: External ear normal.  Nose: Nose normal.  Mouth/Throat: Oropharynx is clear and moist.  Eyes: Pupils are equal, round, and reactive to light. Conjunctivae are normal. Right eye exhibits no discharge. Left eye exhibits no discharge.  Neck: Normal range of motion. Neck supple. No JVD present. No tracheal deviation present. No thyromegaly present.  Cardiovascular: Normal rate, regular rhythm and normal heart sounds.  Pulmonary/Chest: No stridor. No respiratory distress. She has no wheezes.  Abdominal: Soft. Bowel sounds are normal. She exhibits no distension and no mass. There is no tenderness. There is no rebound and no guarding.  Musculoskeletal: She exhibits no edema or tenderness.  Lymphadenopathy:    She has no cervical adenopathy.  Neurological: She  displays normal reflexes. No cranial nerve deficit. She exhibits normal muscle tone. Coordination normal.  Skin: No rash noted. No erythema.  Psychiatric: She has a normal mood and affect. Her behavior is normal. Judgment and thought content normal.  alert, cooperative Genitals w/atrophy; perianal area -- all WNL  FTF >20 min discussing dementia  Lab Results  Component Value Date   WBC 4.9 10/14/2016   HGB 13.6 10/14/2016   HCT 41.6  10/14/2016   PLT 266.0 10/14/2016   GLUCOSE 106 (H) 10/14/2016   CHOL 213 (H) 09/02/2016   TRIG 157.0 (H) 09/02/2016   HDL 47.60 09/02/2016   LDLDIRECT 177.6 12/20/2009   LDLCALC 134 (H) 09/02/2016   ALT 9 10/14/2016   AST 17 10/14/2016   NA 138 10/14/2016   K 3.5 10/14/2016   CL 101 10/14/2016   CREATININE 0.99 10/14/2016   BUN 10 10/14/2016   CO2 34 (H) 10/14/2016   TSH 0.60 09/02/2016    Dg Ribs Unilateral Right  Result Date: 09/24/2017 CLINICAL DATA:  Right anterior chest pain. Fall for 4 days ago. Contusion right chest wall, initial encounter. EXAM: RIGHT RIBS - 2 VIEW COMPARISON:  Chest radiograph 10/14/2016 FINDINGS: Right ribs are intact without a displaced fracture. Negative for right pneumothorax. No gross abnormality to the right shoulder. Stable sclerotic area in the T12 vertebral body. IMPRESSION: No acute abnormality. Electronically Signed   By: Markus Daft M.D.   On: 09/24/2017 08:19    Assessment & Plan:   There are no diagnoses linked to this encounter.   No orders of the defined types were placed in this encounter.    Follow-up: No follow-ups on file.  Walker Kehr, MD

## 2017-10-13 ENCOUNTER — Ambulatory Visit: Payer: Self-pay | Admitting: Internal Medicine

## 2017-10-13 ENCOUNTER — Ambulatory Visit: Payer: Medicare Other | Admitting: Internal Medicine

## 2017-10-13 NOTE — Telephone Encounter (Signed)
Please advise 

## 2017-10-13 NOTE — Telephone Encounter (Signed)
Pt who was seen yesterday in office, called to report a "bulge " to her right stomach- unable to clarify exact location. Pt stated that she noted the bulge 2 weeks ago.  Unsure if the bulge is located to her right lower quadrant or somewhere near her stomach. Pt referred the location as the right bottom side of her stomach. Pt stated that the pain comes and goes. Pt stated the pain is mild and radiates "down to my urine place." Pt denies burning with urination, frequency. Getting the information was unclear of her pain and any further details. Pt has Alzheimer's disease per chart review. Unable to get any further detail. Per OV note, there was not any notation of a bulge on her abdomen.  Called daughter Marion Downer [phone]) and updated her on what information her mother was able to give to me. Daughter stated that she accompanied pt to the doctors office and examination room and stated that there was not a bulge. Daughter stated that "pt does this" - regarding calling doctor or needing to go to the doctor for physical symptoms that are not present. Daughter reminded me that her Mother has Alzheimer's.  Informed daughter that I would send a note back to the office to let her provider know of the pt's complaint. Asked to call daughter with advice or need for an appointment.  Reason for Disposition . Age > 60 years . [1] MILD pain (e.g., does not interfere with normal activities) AND [2] pain comes and goes (cramps) AND [3] present > 48 hours  Answer Assessment - Initial Assessment Questions 1. LOCATION: "Where does it hurt?"      Right lower abdomen referred to "my urine place." 2. RADIATION: "Does the pain shoot anywhere else?" (e.g., chest, back)     no 3. ONSET: "When did the pain begin?" (e.g., minutes, hours or days ago)      2 weeks ago 4. SUDDEN: "Gradual or sudden onset?"     gradually 5. PATTERN "Does the pain come and go, or is it constant?"    - If constant: "Is it getting better,  staying the same, or worsening?"      (Note: Constant means the pain never goes away completely; most serious pain is constant and it progresses)     - If intermittent: "How long does it last?" "Do you have pain now?"     (Note: Intermittent means the pain goes away completely between bouts)     intermittent 6. SEVERITY: "How bad is the pain?"  (e.g., Scale 1-10; mild, moderate, or severe)   - MILD (1-3): doesn't interfere with normal activities, abdomen soft and not tender to touch    - MODERATE (4-7): interferes with normal activities or awakens from sleep, tender to touch    - SEVERE (8-10): excruciating pain, doubled over, unable to do any normal activities   mild 7. RECURRENT SYMPTOM: "Have you ever had this type of abdominal pain before?" If so, ask: "When was the last time?" and "What happened that time?"      no 8. CAUSE: "What do you think is causing the abdominal pain?"     Pt doesn't know 9. RELIEVING/AGGRAVATING FACTORS: "What makes it better or worse?" (e.g., movement, antacids, bowel movement)     Pt stated I don't know 10. OTHER SYMPTOMS: "Has there been any vomiting, diarrhea, constipation, or urine problems?" no 11. PREGNANCY: "Is there any chance you are pregnant?" "When was your last menstrual period?"  n/a  Protocols used: ABDOMINAL PAIN [facility]

## 2017-10-14 NOTE — Telephone Encounter (Signed)
Pls inform dtr -most likely it is an age-related vaginal prolapse due to weakness of pelvic muscles.  If it continues to bother the patient we can refer her to see a gynecologist.  Thank you

## 2017-10-14 NOTE — Telephone Encounter (Signed)
pts daughter notified.

## 2017-11-27 DIAGNOSIS — H26491 Other secondary cataract, right eye: Secondary | ICD-10-CM | POA: Diagnosis not present

## 2017-12-03 ENCOUNTER — Other Ambulatory Visit: Payer: Self-pay | Admitting: Internal Medicine

## 2017-12-07 NOTE — Telephone Encounter (Signed)
Patient's daughter, Hassan Rowan called to check the status of her refill for Lorazepam.

## 2017-12-23 ENCOUNTER — Ambulatory Visit (INDEPENDENT_AMBULATORY_CARE_PROVIDER_SITE_OTHER): Payer: Medicare Other | Admitting: Internal Medicine

## 2017-12-23 ENCOUNTER — Encounter: Payer: Self-pay | Admitting: Internal Medicine

## 2017-12-23 DIAGNOSIS — R443 Hallucinations, unspecified: Secondary | ICD-10-CM

## 2017-12-23 DIAGNOSIS — F0281 Dementia in other diseases classified elsewhere with behavioral disturbance: Secondary | ICD-10-CM | POA: Diagnosis not present

## 2017-12-23 DIAGNOSIS — R4689 Other symptoms and signs involving appearance and behavior: Secondary | ICD-10-CM | POA: Diagnosis not present

## 2017-12-23 DIAGNOSIS — F02818 Dementia in other diseases classified elsewhere, unspecified severity, with other behavioral disturbance: Secondary | ICD-10-CM

## 2017-12-23 DIAGNOSIS — G301 Alzheimer's disease with late onset: Secondary | ICD-10-CM

## 2017-12-23 NOTE — Assessment & Plan Note (Signed)
Worse D/w dtr - the pt needs an assisted living admission w/memory care unit SW consult Pt is refusing meds

## 2017-12-23 NOTE — Addendum Note (Signed)
Addended by: Karren Cobble on: 12/23/2017 03:19 PM   Modules accepted: Orders

## 2017-12-23 NOTE — Progress Notes (Signed)
Subjective:  Patient ID: Lindsey Dickson, female    DOB: 1920/10/10  Age: 84 y.o. MRN: [id number]  CC: No chief complaint on file.   HPI Lindsey Dickson presents for Alzheimer's - worse. Paranoid, confused, prone to wondering Pt is refusing to take Abilify Comes w/dtr   Outpatient Medications Prior to Visit  Medication Sig Dispense Refill  . acetaminophen (TYLENOL) 325 MG tablet Take 650 mg by mouth every 6 (six) hours as needed.    . ARIPiprazole (ABILIFY) 2 MG tablet Take 1 tablet (2 mg total) by mouth daily. 30 tablet 3  . b complex vitamins tablet Take 1 tablet by mouth daily. 100 tablet 3  . Cholecalciferol 1000 UNITS tablet Take 1,000 Units by mouth daily.      [name] esomeprazole (NEXIUM) 20 MG capsule Take 1 capsule (20 mg total) by mouth daily as needed. 30 capsule 0  . LORazepam (ATIVAN) 0.5 MG tablet TAKE 1-2 TABLETS BY MOUTH AT BEDTIME AS NEEDED FOR ANXIETY OR SLEEP. 180 tablet 0  . magnesium hydroxide (MILK OF MAGNESIA) 400 MG/5ML suspension Take 7.5 mLs by mouth daily as needed for mild constipation. Reported on 06/25/2015    . potassium chloride (KLOR-CON) 8 MEQ tablet TAKE 1 TABLET BY MOUTH EVERY DAY 30 tablet 11   No facility-administered medications prior to visit.     ROS: Review of Systems  Constitutional: Negative for activity change, appetite change, chills, fatigue and unexpected weight change.  HENT: Negative for congestion, mouth sores and sinus pressure.   Eyes: Negative for visual disturbance.  Respiratory: Negative for cough and chest tightness.   Gastrointestinal: Negative for abdominal pain and nausea.  Genitourinary: Negative for difficulty urinating, frequency and vaginal pain.  Musculoskeletal: Negative for back pain and gait problem.  Skin: Negative for pallor and rash.  Neurological: Negative for dizziness, tremors, weakness, numbness and headaches.  Psychiatric/Behavioral: Positive for confusion, decreased concentration, dysphoric mood and  hallucinations. Negative for sleep disturbance and suicidal ideas. The patient is nervous/anxious.     Objective:  BP 136/84 (BP Location: Left Arm, Patient Position: Sitting, Cuff Size: Normal)   Pulse 78   Temp 98 F (36.7 C) (Oral)   Ht 5\' 1"  (1.549 m)   Wt 93 lb (42.2 kg)   SpO2 97%   BMI 17.57 kg/m   BP Readings from Last 3 Encounters:  12/23/17 136/84  10/12/17 138/84  09/23/17 110/78    Wt Readings from Last 3 Encounters:  12/23/17 93 lb (42.2 kg)  10/12/17 93 lb (42.2 kg)  09/23/17 93 lb 12 oz (42.5 kg)    Physical Exam  Constitutional: She appears well-developed. No distress.  HENT:  Head: Normocephalic.  Right Ear: External ear normal.  Left Ear: External ear normal.  Nose: Nose normal.  Mouth/Throat: Oropharynx is clear and moist.  Eyes: Pupils are equal, round, and reactive to light. Conjunctivae are normal. Right eye exhibits no discharge. Left eye exhibits no discharge.  Neck: Normal range of motion. Neck supple. No JVD present. No tracheal deviation present. No thyromegaly present.  Cardiovascular: Normal rate, regular rhythm and normal heart sounds.  Pulmonary/Chest: No stridor. No respiratory distress. She has no wheezes.  Abdominal: Soft. Bowel sounds are normal. She exhibits no distension and no mass. There is no tenderness. There is no rebound and no guarding.  Musculoskeletal: She exhibits no edema or tenderness.  Lymphadenopathy:    She has no cervical adenopathy.  Neurological: She displays normal reflexes. No cranial nerve deficit. She exhibits  normal muscle tone. Coordination normal.  Skin: No rash noted. No erythema.  Psychiatric: She has a normal mood and affect. Her behavior is normal. Judgment and thought content normal.  confused  FTF>20 min discussing the case   Lab Results  Component Value Date   WBC 4.9 10/14/2016   HGB 13.6 10/14/2016   HCT 41.6 10/14/2016   PLT 266.0 10/14/2016   GLUCOSE 106 (H) 10/14/2016   CHOL 213 (H)  09/02/2016   TRIG 157.0 (H) 09/02/2016   HDL 47.60 09/02/2016   LDLDIRECT 177.6 12/20/2009   LDLCALC 134 (H) 09/02/2016   ALT 9 10/14/2016   AST 17 10/14/2016   NA 138 10/14/2016   K 3.5 10/14/2016   CL 101 10/14/2016   CREATININE 0.99 10/14/2016   BUN 10 10/14/2016   CO2 34 (H) 10/14/2016   TSH 0.60 09/02/2016    Dg Ribs Unilateral Right  Result Date: 09/24/2017 CLINICAL DATA:  Right anterior chest pain. Fall for 4 days ago. Contusion right chest wall, initial encounter. EXAM: RIGHT RIBS - 2 VIEW COMPARISON:  Chest radiograph 10/14/2016 FINDINGS: Right ribs are intact without a displaced fracture. Negative for right pneumothorax. No gross abnormality to the right shoulder. Stable sclerotic area in the T12 vertebral body. IMPRESSION: No acute abnormality. Electronically Signed   By: Markus Daft M.D.   On: 09/24/2017 08:19    Assessment & Plan:   There are no diagnoses linked to this encounter.   No orders of the defined types were placed in this encounter.    Follow-up: No follow-ups on file.  Walker Kehr, MD

## 2017-12-23 NOTE — Addendum Note (Signed)
Addended by: Karren Cobble on: 12/23/2017 03:17 PM   Modules accepted: Orders

## 2017-12-23 NOTE — Assessment & Plan Note (Signed)
  D/w dtr - the pt needs an assisted living admission w/memory care unit SW consult Pt is refusing meds

## 2018-02-03 ENCOUNTER — Other Ambulatory Visit (INDEPENDENT_AMBULATORY_CARE_PROVIDER_SITE_OTHER): Payer: Medicare Other

## 2018-02-03 ENCOUNTER — Ambulatory Visit (INDEPENDENT_AMBULATORY_CARE_PROVIDER_SITE_OTHER): Payer: Medicare Other | Admitting: Internal Medicine

## 2018-02-03 ENCOUNTER — Encounter: Payer: Self-pay | Admitting: Internal Medicine

## 2018-02-03 DIAGNOSIS — I10 Essential (primary) hypertension: Secondary | ICD-10-CM

## 2018-02-03 DIAGNOSIS — F0281 Dementia in other diseases classified elsewhere with behavioral disturbance: Secondary | ICD-10-CM

## 2018-02-03 DIAGNOSIS — G301 Alzheimer's disease with late onset: Secondary | ICD-10-CM | POA: Diagnosis not present

## 2018-02-03 DIAGNOSIS — F419 Anxiety disorder, unspecified: Secondary | ICD-10-CM | POA: Diagnosis not present

## 2018-02-03 DIAGNOSIS — R634 Abnormal weight loss: Secondary | ICD-10-CM

## 2018-02-03 DIAGNOSIS — F02818 Dementia in other diseases classified elsewhere, unspecified severity, with other behavioral disturbance: Secondary | ICD-10-CM

## 2018-02-03 LAB — BASIC METABOLIC PANEL
BUN: 8 mg/dL (ref 6–23)
CO2: 29 mEq/L (ref 19–32)
Calcium: 9.3 mg/dL (ref 8.4–10.5)
Chloride: 103 mEq/L (ref 96–112)
Creatinine, Ser: 0.92 mg/dL (ref 0.40–1.20)
GFR: 60.16 mL/min (ref >60.00)
Glucose, Bld: 129 mg/dL — ABNORMAL HIGH (ref 70–99)
POTASSIUM: 3.4 meq/L — AB (ref 3.5–5.1)
Sodium: 139 mEq/L (ref 135–145)

## 2018-02-03 LAB — CBC WITH DIFFERENTIAL/PLATELET
Basophils Absolute: 0.1 10*3/uL (ref 0.0–0.1)
Basophils Relative: 1.2 % (ref 0.0–3.0)
Eosinophils Absolute: 0.2 10*3/uL (ref 0.0–0.7)
Eosinophils Relative: 3.7 % (ref 0.0–5.0)
HCT: 40.7 % (ref 36.0–46.0)
Hemoglobin: 13.4 g/dL (ref 12.0–15.0)
Lymphocytes Relative: 30.2 % (ref 12.0–46.0)
Lymphs Abs: 1.6 10*3/uL (ref 0.7–4.0)
MCHC: 33.1 g/dL (ref 30.0–36.0)
MCV: 85.3 fl (ref 78.0–100.0)
Monocytes Absolute: 0.7 10*3/uL (ref 0.1–1.0)
Monocytes Relative: 12.7 % — ABNORMAL HIGH (ref 3.0–12.0)
Neutro Abs: 2.8 10*3/uL (ref 1.4–7.7)
Neutrophils Relative %: 52.2 % (ref 43.0–77.0)
PLATELETS: 266 10*3/uL (ref 150.0–400.0)
RBC: 4.77 Mil/uL (ref 3.87–5.11)
RDW: 14.2 % (ref 11.5–15.5)
WBC: 5.3 10*3/uL (ref 4.0–10.5)

## 2018-02-03 LAB — HEPATIC FUNCTION PANEL
ALT: 9 U/L (ref 0–35)
AST: 16 U/L (ref 0–37)
Albumin: 3.6 g/dL (ref 3.5–5.2)
Alkaline Phosphatase: 104 U/L (ref 39–117)
BILIRUBIN DIRECT: 0.1 mg/dL (ref 0.0–0.3)
TOTAL PROTEIN: 7.4 g/dL (ref 6.0–8.3)
Total Bilirubin: 0.5 mg/dL (ref 0.2–1.2)

## 2018-02-03 LAB — TSH: TSH: 0.65 u[IU]/mL (ref 0.35–4.50)

## 2018-02-03 NOTE — Assessment & Plan Note (Signed)
Wt Readings from Last 3 Encounters:  02/03/18 94 lb (42.6 kg)  12/23/17 93 lb (42.2 kg)  10/12/17 93 lb (42.2 kg)

## 2018-02-03 NOTE — Assessment & Plan Note (Signed)
BP Readings from Last 3 Encounters:  02/03/18 134/82  12/23/17 136/84  10/12/17 138/84

## 2018-02-03 NOTE — Assessment & Plan Note (Signed)
Start Abilify.

## 2018-02-03 NOTE — Assessment & Plan Note (Signed)
Lorazepam prn  Potential benefits of a long term benzodiazepines  use as well as potential risks  and complications were explained to the patient and were aknowledged.  

## 2018-02-03 NOTE — Progress Notes (Signed)
Subjective:  Patient ID: Lindsey Dickson, female    DOB: Feb 20, 1920  Age: 85 y.o. MRN: [id number]  CC: No chief complaint on file.   HPI Lindsey Dickson presents for dementia, anxiety and insomnia. Not taking Abilify  Outpatient Medications Prior to Visit  Medication Sig Dispense Refill  . acetaminophen (TYLENOL) 325 MG tablet Take 650 mg by mouth every 6 (six) hours as needed.    . ARIPiprazole (ABILIFY) 2 MG tablet Take 1 tablet (2 mg total) by mouth daily. 30 tablet 3  . b complex vitamins tablet Take 1 tablet by mouth daily. 100 tablet 3  . Cholecalciferol 1000 UNITS tablet Take 1,000 Units by mouth daily.      [name] esomeprazole (NEXIUM) 20 MG capsule Take 1 capsule (20 mg total) by mouth daily as needed. 30 capsule 0  . LORazepam (ATIVAN) 0.5 MG tablet TAKE 1-2 TABLETS BY MOUTH AT BEDTIME AS NEEDED FOR ANXIETY OR SLEEP. 180 tablet 0  . magnesium hydroxide (MILK OF MAGNESIA) 400 MG/5ML suspension Take 7.5 mLs by mouth daily as needed for mild constipation. Reported on 06/25/2015    . potassium chloride (KLOR-CON) 8 MEQ tablet TAKE 1 TABLET BY MOUTH EVERY DAY 30 tablet 11   No facility-administered medications prior to visit.     ROS: Review of Systems  Constitutional: Negative.  Negative for activity change, appetite change, chills, diaphoresis, fatigue, fever and unexpected weight change.  HENT: Negative for congestion, ear pain, facial swelling, hearing loss, mouth sores, nosebleeds, postnasal drip, rhinorrhea, sinus pressure, sneezing, sore throat, tinnitus and trouble swallowing.   Eyes: Negative for pain, discharge, redness, itching and visual disturbance.  Respiratory: Negative for cough, chest tightness, shortness of breath, wheezing and stridor.   Cardiovascular: Negative for chest pain, palpitations and leg swelling.  Gastrointestinal: Negative for abdominal distention, anal bleeding, blood in stool, constipation, diarrhea, nausea and rectal pain.  Genitourinary: Negative for  difficulty urinating, dysuria, flank pain, frequency, genital sores, hematuria, pelvic pain, urgency, vaginal bleeding and vaginal discharge.  Musculoskeletal: Negative for arthralgias, back pain, gait problem, joint swelling, neck pain and neck stiffness.  Skin: Negative.  Negative for rash.  Neurological: Negative for dizziness, tremors, seizures, syncope, speech difficulty, weakness, numbness and headaches.  Hematological: Negative for adenopathy. Does not bruise/bleed easily.  Psychiatric/Behavioral: Positive for confusion, decreased concentration and dysphoric mood. Negative for behavioral problems, sleep disturbance and suicidal ideas. The patient is nervous/anxious.     Objective:  BP 134/82 (BP Location: Left Arm, Patient Position: Sitting, Cuff Size: Normal)   Pulse 78   Temp 97.6 F (36.4 C) (Oral)   Ht 5\' 1"  (1.549 m)   Wt 94 lb (42.6 kg)   SpO2 98%   BMI 17.76 kg/m   BP Readings from Last 3 Encounters:  02/03/18 134/82  12/23/17 136/84  10/12/17 138/84    Wt Readings from Last 3 Encounters:  02/03/18 94 lb (42.6 kg)  12/23/17 93 lb (42.2 kg)  10/12/17 93 lb (42.2 kg)    Physical Exam Constitutional:      General: She is not in acute distress.    Appearance: She is well-developed.  HENT:     Head: Normocephalic.     Right Ear: External ear normal.     Left Ear: External ear normal.     Nose: Nose normal.  Eyes:     General:        Right eye: No discharge.        Left eye: No discharge.  Conjunctiva/sclera: Conjunctivae normal.     Pupils: Pupils are equal, round, and reactive to light.  Neck:     Musculoskeletal: Normal range of motion and neck supple.     Thyroid: No thyromegaly.     Vascular: No JVD.     Trachea: No tracheal deviation.  Cardiovascular:     Rate and Rhythm: Normal rate and regular rhythm.     Heart sounds: Normal heart sounds.  Pulmonary:     Effort: No respiratory distress.     Breath sounds: No stridor. No wheezing.    Abdominal:     General: Bowel sounds are normal. There is no distension.     Palpations: Abdomen is soft. There is no mass.     Tenderness: There is no abdominal tenderness. There is no guarding or rebound.  Musculoskeletal:        General: No tenderness.  Lymphadenopathy:     Cervical: No cervical adenopathy.  Skin:    Findings: No erythema or rash.  Neurological:     Cranial Nerves: No cranial nerve deficit.     Motor: No abnormal muscle tone.     Coordination: Coordination normal.     Deep Tendon Reflexes: Reflexes normal.  Psychiatric:        Behavior: Behavior normal.        Thought Content: Thought content normal.        Judgment: Judgment normal.   alert, cooperative  Lab Results  Component Value Date   WBC 4.9 10/14/2016   HGB 13.6 10/14/2016   HCT 41.6 10/14/2016   PLT 266.0 10/14/2016   GLUCOSE 106 (H) 10/14/2016   CHOL 213 (H) 09/02/2016   TRIG 157.0 (H) 09/02/2016   HDL 47.60 09/02/2016   LDLDIRECT 177.6 12/20/2009   LDLCALC 134 (H) 09/02/2016   ALT 9 10/14/2016   AST 17 10/14/2016   NA 138 10/14/2016   K 3.5 10/14/2016   CL 101 10/14/2016   CREATININE 0.99 10/14/2016   BUN 10 10/14/2016   CO2 34 (H) 10/14/2016   TSH 0.60 09/02/2016    Dg Ribs Unilateral Right  Result Date: 09/24/2017 CLINICAL DATA:  Right anterior chest pain. Fall for 4 days ago. Contusion right chest wall, initial encounter. EXAM: RIGHT RIBS - 2 VIEW COMPARISON:  Chest radiograph 10/14/2016 FINDINGS: Right ribs are intact without a displaced fracture. Negative for right pneumothorax. No gross abnormality to the right shoulder. Stable sclerotic area in the T12 vertebral body. IMPRESSION: No acute abnormality. Electronically Signed   By: Markus Daft M.D.   On: 09/24/2017 08:19    Assessment & Plan:   There are no diagnoses linked to this encounter.   No orders of the defined types were placed in this encounter.    Follow-up: No follow-ups on file.  Walker Kehr, MD

## 2018-03-06 ENCOUNTER — Other Ambulatory Visit: Payer: Self-pay | Admitting: Internal Medicine

## 2018-03-10 ENCOUNTER — Other Ambulatory Visit: Payer: Self-pay | Admitting: Internal Medicine

## 2018-03-10 NOTE — Telephone Encounter (Signed)
Copied from [place] [id number]. Topic: Quick Communication - Rx Refill/Question >> Mar 10, 2018 12:09 PM [place], [place] D wrote: Medication: LORazepam (ATIVAN) 0.5 MG tablet / Pt's daughter called and stated the pharmacy would not refill rx until 04/08/18. Dr. Alain Marion sent in refill on 03/09/18. She does not know why they will not fill it. Please advise.  Has the patient contacted their pharmacy? Yes.   (Agent: If no, request that the patient contact the pharmacy for the refill.) (Agent: If yes, when and what did the pharmacy advise?)  Preferred Pharmacy (with phone number or street name): [id number] - [place], [place] [phone] (Phone) [phone] (Fax)  Agent: Please be advised that RX refills may take up to 3 business days. We ask that you follow-up with your pharmacy.

## 2018-03-10 NOTE — Telephone Encounter (Signed)
Requested medication (s) are due for refill today -no  Requested medication (s) are on the active medication list -yes  Future visit scheduled - yes  Last refill: 03/09/17  Notes to clinic: patient is having trouble getting her medication- sent for review  Requested Prescriptions  Pending Prescriptions Disp Refills   LORazepam (ATIVAN) 0.5 MG tablet 180 tablet 1     Not Delegated - Psychiatry:  Anxiolytics/Hypnotics Failed - 03/10/2018 12:16 PM      Failed - This refill cannot be delegated      Failed - Urine Drug Screen completed in last 360 days.      Passed - Valid encounter within last 6 months    Recent Outpatient Visits          1 month ago Essential hypertension   [place] Plotnikov, Evie Lacks, MD   2 months ago Late onset Alzheimer's disease with behavioral disturbance ([place])   [facility] [facility], Evie Lacks, MD   4 months ago Late onset Alzheimer's disease with behavioral disturbance   [place] Plotnikov, Evie Lacks, MD   5 months ago Contusion of right chest wall, initial encounter   [place] Plotnikov, Evie Lacks, MD   8 months ago [facility], Evie Lacks, MD      Future Appointments            In 1 month Plotnikov, Evie Lacks, MD [place], [facility]            Requested Prescriptions  Pending Prescriptions Disp Refills   LORazepam (ATIVAN) 0.5 MG tablet 180 tablet 1     Not Delegated - Psychiatry:  Anxiolytics/Hypnotics Failed - 03/10/2018 12:16 PM      Failed - This refill cannot be delegated      Failed - Urine Drug Screen completed in last 360 days.      Passed - Valid encounter within last 6 months    Recent Outpatient Visits          1 month ago Essential hypertension   [place] Plotnikov, Evie Lacks, MD   2 months ago Late onset  Alzheimer's disease with behavioral disturbance ([place])   [facility], Evie Lacks, MD   4 months ago Late onset Alzheimer's disease with behavioral disturbance   [place], [place]   5 months ago Contusion of right chest wall, initial encounter   [place], Evie Lacks, MD   8 months ago [place], MD      Future Appointments            In 1 month Plotnikov, Evie Lacks, MD [place], [place]

## 2018-03-11 NOTE — Telephone Encounter (Signed)
Please advise 

## 2018-03-11 NOTE — Telephone Encounter (Signed)
Pt called stating the pharmacy stated she is not due for her refill of this drug until 3.19.20, this was verified with the database, I made sure to tell Hassan Rowan to make sure the patient is patient a maximum of 2 pills a day and no more she expressed understanding and would like to know what to do in the meantime for the patients medication. Please advise

## 2018-03-12 NOTE — Telephone Encounter (Signed)
Hassan Rowan calling back and states that the rx bottle did state 180 count.

## 2018-03-12 NOTE — Telephone Encounter (Signed)
Pt daughter Santiago Glad is on the phone stating that her Mother need this medicine today that she has been off of medicine since Tuesday she wouid like to speak with [employer] assistant

## 2018-03-12 NOTE — Telephone Encounter (Signed)
Spoke to Kirk and she stated that pt is out of her lorazepam. She wanted to know if early fill could be called into the pharmacy. She stated that the pharmacy did not give her mother all of her medication and that she only was given #90 like the last prescription that was sent in.  I informed that I would need to call the pharmacy to verify since the information that she is giving me is different than the information on the database. Spoke to pharmacy and they confirmed that #180 on 01/07/18.   Called Hassan Rowan back and stated that the pharmacy record was as above and informed that the pharmacy needed the bottles so that they could investigate whether there was a shortage.

## 2018-03-16 NOTE — Telephone Encounter (Signed)
Daughter has called back.  Would like to know if early refill will be approved.  Please follow up in regard.

## 2018-03-16 NOTE — Telephone Encounter (Signed)
Please sign RX

## 2018-03-16 NOTE — Telephone Encounter (Signed)
Ok to refill Dtr needs to monitor and dispense Rx in a mediset box Thx

## 2018-03-18 MED ORDER — LORAZEPAM 0.5 MG PO TABS: ORAL_TABLET | ORAL | 0 refills | Status: DC

## 2018-04-13 ENCOUNTER — Ambulatory Visit: Payer: Medicare Other | Admitting: Family

## 2018-05-06 ENCOUNTER — Ambulatory Visit: Payer: Medicare Other | Admitting: Internal Medicine

## 2018-07-27 ENCOUNTER — Encounter: Payer: Self-pay | Admitting: Internal Medicine

## 2018-07-27 ENCOUNTER — Ambulatory Visit (INDEPENDENT_AMBULATORY_CARE_PROVIDER_SITE_OTHER): Payer: Medicare Other | Admitting: Internal Medicine

## 2018-07-27 ENCOUNTER — Other Ambulatory Visit (INDEPENDENT_AMBULATORY_CARE_PROVIDER_SITE_OTHER): Payer: Medicare Other

## 2018-07-27 ENCOUNTER — Other Ambulatory Visit: Payer: Self-pay | Admitting: Internal Medicine

## 2018-07-27 ENCOUNTER — Other Ambulatory Visit: Payer: Self-pay

## 2018-07-27 VITALS — BP 132/84 | HR 83 | Temp 98.1°F | Ht 61.0 in | Wt 91.0 lb

## 2018-07-27 DIAGNOSIS — G301 Alzheimer's disease with late onset: Secondary | ICD-10-CM

## 2018-07-27 DIAGNOSIS — R4689 Other symptoms and signs involving appearance and behavior: Secondary | ICD-10-CM

## 2018-07-27 DIAGNOSIS — F419 Anxiety disorder, unspecified: Secondary | ICD-10-CM | POA: Diagnosis not present

## 2018-07-27 DIAGNOSIS — F0281 Dementia in other diseases classified elsewhere with behavioral disturbance: Secondary | ICD-10-CM

## 2018-07-27 DIAGNOSIS — Z0000 Encounter for general adult medical examination without abnormal findings: Secondary | ICD-10-CM | POA: Diagnosis not present

## 2018-07-27 DIAGNOSIS — I10 Essential (primary) hypertension: Secondary | ICD-10-CM

## 2018-07-27 DIAGNOSIS — M79604 Pain in right leg: Secondary | ICD-10-CM

## 2018-07-27 LAB — URINALYSIS
Bilirubin Urine: NEGATIVE
Hgb urine dipstick: NEGATIVE
Ketones, ur: NEGATIVE
Leukocytes,Ua: NEGATIVE
Nitrite: NEGATIVE
Specific Gravity, Urine: 1.015 (ref 1.000–1.030)
Total Protein, Urine: NEGATIVE
Urine Glucose: NEGATIVE
Urobilinogen, UA: 0.2 (ref 0.0–1.0)
pH: 7.5 (ref 5.0–8.0)

## 2018-07-27 LAB — CBC WITH DIFFERENTIAL/PLATELET
Basophils Absolute: 0.1 10*3/uL (ref 0.0–0.1)
Basophils Relative: 1.2 % (ref 0.0–3.0)
Eosinophils Absolute: 0.3 10*3/uL (ref 0.0–0.7)
Eosinophils Relative: 5.3 % — ABNORMAL HIGH (ref 0.0–5.0)
HCT: 42.4 % (ref 36.0–46.0)
Hemoglobin: 13.9 g/dL (ref 12.0–15.0)
Lymphocytes Relative: 39 % (ref 12.0–46.0)
Lymphs Abs: 2 10*3/uL (ref 0.7–4.0)
MCHC: 32.8 g/dL (ref 30.0–36.0)
MCV: 86.2 fl (ref 78.0–100.0)
Monocytes Absolute: 0.6 10*3/uL (ref 0.1–1.0)
Monocytes Relative: 11.4 % (ref 3.0–12.0)
Neutro Abs: 2.2 10*3/uL (ref 1.4–7.7)
Neutrophils Relative %: 43.1 % (ref 43.0–77.0)
Platelets: 249 10*3/uL (ref 150.0–400.0)
RBC: 4.91 Mil/uL (ref 3.87–5.11)
RDW: 14 % (ref 11.5–15.5)
WBC: 5.1 10*3/uL (ref 4.0–10.5)

## 2018-07-27 LAB — BASIC METABOLIC PANEL
BUN: 6 mg/dL (ref 6–23)
CO2: 26 mEq/L (ref 19–32)
Calcium: 9 mg/dL (ref 8.4–10.5)
Chloride: 105 mEq/L (ref 96–112)
Creatinine, Ser: 0.83 mg/dL (ref 0.40–1.20)
GFR: 63.67 mL/min (ref >60.00)
Glucose, Bld: 104 mg/dL — ABNORMAL HIGH (ref 70–99)
Potassium: 3.3 mEq/L — ABNORMAL LOW (ref 3.5–5.1)
Sodium: 141 mEq/L (ref 135–145)

## 2018-07-27 LAB — HEPATIC FUNCTION PANEL
ALT: 7 U/L (ref 0–35)
AST: 15 U/L (ref 0–37)
Albumin: 3.7 g/dL (ref 3.5–5.2)
Alkaline Phosphatase: 104 U/L (ref 39–117)
Bilirubin, Direct: 0.1 mg/dL (ref 0.0–0.3)
Total Bilirubin: 0.4 mg/dL (ref 0.2–1.2)
Total Protein: 7.4 g/dL (ref 6.0–8.3)

## 2018-07-27 LAB — TSH: TSH: 1.04 u[IU]/mL (ref 0.35–4.50)

## 2018-07-27 MED ORDER — POTASSIUM CHLORIDE ER 8 MEQ PO TBCR: 8.0000 meq | EXTENDED_RELEASE_TABLET | Freq: Every day | ORAL | 3 refills | Status: DC

## 2018-07-27 NOTE — Assessment & Plan Note (Signed)
Pt ran out out of Lorazepam 1 mo ago - doing better w/o it.Marland KitchenMarland Kitchen

## 2018-07-27 NOTE — Assessment & Plan Note (Signed)
We discussed age appropriate health related issues, including available/recomended screening tests and vaccinations. We discussed a need for adhering to healthy diet and exercise. Labs were ordered to be later reviewed . All questions were answered. Declined shingles shot, PAP, colonoscopy

## 2018-07-27 NOTE — Patient Instructions (Addendum)
LORazepam (ATIVAN) 0.5 MG tablet             Start: 03/18/2018 Ord/Sold: 03/18/2018 (O) Pharmacy: [id number] [place] [facility] [place], [place] Report Taking:  Long-term:  Med Dose History ChangeReorderDiscontinue     Summary: TAKE 1-2 TABLETS BY MOUTH AT BEDTIME AS NEEDED FOR ANXIETY OR SLEEP., Normal     Patient Sig: TAKE 1-2 TABLETS BY MOUTH AT BEDTIME AS NEEDED FOR ANXIETY OR SLEEP.     Ordered on: 03/18/2018     Authorized by: Cassandria Anger     Dispense: 180 tablet     Refills: 0 ordered     Admin Instructions: TAKE 1-2 TABLETS BY MOUTH AT BEDTIME AS NEEDED FOR ANXIETY OR SLEEP.     Note to Pharmacy: Not to exceed 5 additional fills before 06/06/2018     Patient not taking. Reported on 07/27/2018      Medical screening examination/treatment/procedure(s) were performed by non-physician practitioner and as supervising physician I was immediately available for consultation/collaboration. I agree with above. Lew Dawes, MD

## 2018-07-27 NOTE — Assessment & Plan Note (Signed)
Not on Rx 

## 2018-07-27 NOTE — Assessment & Plan Note (Signed)
Not taking meds

## 2018-07-27 NOTE — Progress Notes (Signed)
Subjective:  Patient ID: Lindsey Dickson, female    DOB: 10/09/1920  Age: 85 y.o. MRN: [id number]  CC: No chief complaint on file.   HPI Lindsey Dickson presents for dementia, anxiety Pt ran out out of Lorazepam 1 mo ago - doing better w/o it... C/po periodic R leg pain Well exam Not taking meds  Outpatient Medications Prior to Visit  Medication Sig Dispense Refill  . acetaminophen (TYLENOL) 325 MG tablet Take 650 mg by mouth every 6 (six) hours as needed.    . ARIPiprazole (ABILIFY) 2 MG tablet Take 1 tablet (2 mg total) by mouth daily. (Patient not taking: Reported on 07/27/2018) 30 tablet 3  . b complex vitamins tablet Take 1 tablet by mouth daily. (Patient not taking: Reported on 07/27/2018) 100 tablet 3  . Cholecalciferol 1000 UNITS tablet Take 1,000 Units by mouth daily.      [name] esomeprazole (NEXIUM) 20 MG capsule Take 1 capsule (20 mg total) by mouth daily as needed. (Patient not taking: Reported on 07/27/2018) 30 capsule 0  . KLOR-CON 8 MEQ tablet TAKE 1 TABLET BY MOUTH EVERY DAY (Patient not taking: Reported on 07/27/2018) 90 tablet 3  . LORazepam (ATIVAN) 0.5 MG tablet TAKE 1-2 TABLETS BY MOUTH AT BEDTIME AS NEEDED FOR ANXIETY OR SLEEP. (Patient not taking: Reported on 07/27/2018) 180 tablet 0  . magnesium hydroxide (MILK OF MAGNESIA) 400 MG/5ML suspension Take 7.5 mLs by mouth daily as needed for mild constipation. Reported on 06/25/2015     No facility-administered medications prior to visit.     ROS: Review of Systems  Constitutional: Negative for activity change, appetite change, chills, fatigue and unexpected weight change.  HENT: Negative for congestion, mouth sores and sinus pressure.   Eyes: Negative for visual disturbance.  Respiratory: Negative for cough and chest tightness.   Gastrointestinal: Negative for abdominal pain and nausea.  Genitourinary: Negative for difficulty urinating, frequency and vaginal pain.  Musculoskeletal: Positive for arthralgias. Negative for back pain  and gait problem.  Skin: Negative for pallor and rash.  Neurological: Negative for dizziness, tremors, weakness, numbness and headaches.  Psychiatric/Behavioral: Positive for confusion and decreased concentration. Negative for sleep disturbance.    Objective:  BP 132/84 (BP Location: Left Arm, Patient Position: Sitting, Cuff Size: Normal)   Pulse 83   Temp 98.1 F (36.7 C) (Oral)   Ht 5\' 1"  (1.549 m)   Wt 91 lb (41.3 kg)   SpO2 98%   BMI 17.19 kg/m   BP Readings from Last 3 Encounters:  07/27/18 132/84  02/03/18 134/82  12/23/17 136/84    Wt Readings from Last 3 Encounters:  07/27/18 91 lb (41.3 kg)  02/03/18 94 lb (42.6 kg)  12/23/17 93 lb (42.2 kg)    Physical Exam Constitutional:      General: She is not in acute distress.    Appearance: She is well-developed.  HENT:     Head: Normocephalic.     Right Ear: External ear normal.     Left Ear: External ear normal.     Nose: Nose normal.  Eyes:     General:        Right eye: No discharge.        Left eye: No discharge.     Conjunctiva/sclera: Conjunctivae normal.     Pupils: Pupils are equal, round, and reactive to light.  Neck:     Musculoskeletal: Normal range of motion and neck supple.     Thyroid: No thyromegaly.  Vascular: No JVD.     Trachea: No tracheal deviation.  Cardiovascular:     Rate and Rhythm: Normal rate and regular rhythm.     Heart sounds: Normal heart sounds.  Pulmonary:     Effort: No respiratory distress.     Breath sounds: No stridor. No wheezing.  Abdominal:     General: Bowel sounds are normal. There is no distension.     Palpations: Abdomen is soft. There is no mass.     Tenderness: There is no abdominal tenderness. There is no guarding or rebound.  Musculoskeletal:        General: No tenderness.  Lymphadenopathy:     Cervical: No cervical adenopathy.  Skin:    Findings: No erythema or rash.  Neurological:     Mental Status: She is disoriented.     Cranial Nerves: No  cranial nerve deficit.     Motor: No abnormal muscle tone.     Coordination: Coordination normal.     Gait: Gait normal.     Deep Tendon Reflexes: Reflexes normal.  Psychiatric:        Behavior: Behavior normal.        Thought Content: Thought content normal.    LS, BLEs - NT Lab Results  Component Value Date   WBC 5.3 02/03/2018   HGB 13.4 02/03/2018   HCT 40.7 02/03/2018   PLT 266.0 02/03/2018   GLUCOSE 129 (H) 02/03/2018   CHOL 213 (H) 09/02/2016   TRIG 157.0 (H) 09/02/2016   HDL 47.60 09/02/2016   LDLDIRECT 177.6 12/20/2009   LDLCALC 134 (H) 09/02/2016   ALT 9 02/03/2018   AST 16 02/03/2018   NA 139 02/03/2018   K 3.4 (L) 02/03/2018   CL 103 02/03/2018   CREATININE 0.92 02/03/2018   BUN 8 02/03/2018   CO2 29 02/03/2018   TSH 0.65 02/03/2018    Dg Ribs Unilateral Right  Result Date: 09/24/2017 CLINICAL DATA:  Right anterior chest pain. Fall for 4 days ago. Contusion right chest wall, initial encounter. EXAM: RIGHT RIBS - 2 VIEW COMPARISON:  Chest radiograph 10/14/2016 FINDINGS: Right ribs are intact without a displaced fracture. Negative for right pneumothorax. No gross abnormality to the right shoulder. Stable sclerotic area in the T12 vertebral body. IMPRESSION: No acute abnormality. Electronically Signed   By: Markus Daft M.D.   On: 09/24/2017 08:19    Assessment & Plan:   There are no diagnoses linked to this encounter.   No orders of the defined types were placed in this encounter.    Follow-up: No follow-ups on file.  Walker Kehr, MD

## 2018-08-18 ENCOUNTER — Telehealth: Payer: Self-pay | Admitting: Internal Medicine

## 2018-08-18 NOTE — Telephone Encounter (Signed)
Daughter calling to ask if pt would be a candidate for the medication Exelon.

## 2018-08-19 NOTE — Telephone Encounter (Signed)
We have tried to use Exelon type medications in the past.  The problem was compliance.  The patient refused to take meds Thanks

## 2018-08-20 MED ORDER — RIVASTIGMINE 4.6 MG/24HR TD PT24: 4.6000 mg | MEDICATED_PATCH | Freq: Every day | TRANSDERMAL | 12 refills | Status: AC

## 2018-08-20 NOTE — Telephone Encounter (Signed)
Patients daughter called back.  I gave her Dr. Reola Calkins response.  Daughter states that yes, pt has not been compliant but states she seen a patch form of the exelon.  Would like to know if this would be a consideration.

## 2018-08-20 NOTE — Telephone Encounter (Signed)
Left message for Hassan Rowan to call back to inform of below.

## 2018-08-20 NOTE — Telephone Encounter (Signed)
No problem.  [facility] Exelon [facility]  Change daily.  Thanks

## 2018-08-23 NOTE — Telephone Encounter (Signed)
Patients daughter informed

## 2018-08-25 ENCOUNTER — Telehealth: Payer: Self-pay

## 2018-08-25 NOTE — Telephone Encounter (Signed)
Exelon PA patch PA approved through 01/20/19.

## 2018-08-25 NOTE — Telephone Encounter (Signed)
PA started on CoverMyMeds [id number]

## 2018-10-14 ENCOUNTER — Ambulatory Visit: Payer: Self-pay | Admitting: Internal Medicine

## 2018-10-14 NOTE — Telephone Encounter (Signed)
Pt's daughter Hassan Rowan returned call to triage. States "There is nothing wrong with her, it's her dementia." Made aware [place]  just wanted to alert her of pt's call. Verbalizes understanding. Does not wish to make appt for pt.

## 2018-10-14 NOTE — Telephone Encounter (Signed)
Pt calling requesting appt to "Bring juice in to have checked, my caregiver puts something in it." States she drinks grape juice every morning that makes her sleepy, sometimes "Drunk."  States she does not take any medications.  Pt states she does not want caregiver coming to her home. "I think she puts liquor in my juice."  States she has not tried to drink a different beverage instead of the grape juice. "I've been drinking it for years." Pt denies headache, dizziness, no other symptoms.  TN attempted to reach daughter, Hassan Rowan, on [place]. Left VM to call back.  Pt has H/O Alzheimers.   Attempted to reach practice, recording.  Assured pt [place] would route to practice for Dr. Judeen Hammans review.  Daughter's #  [phone]  Please advise.

## 2018-10-14 NOTE — Telephone Encounter (Signed)
noted 

## 2018-12-10 DIAGNOSIS — H353123 Nonexudative age-related macular degeneration, left eye, advanced atrophic without subfoveal involvement: Secondary | ICD-10-CM | POA: Diagnosis not present

## 2018-12-10 DIAGNOSIS — H353211 Exudative age-related macular degeneration, right eye, with active choroidal neovascularization: Secondary | ICD-10-CM | POA: Diagnosis not present

## 2018-12-10 DIAGNOSIS — H35371 Puckering of macula, right eye: Secondary | ICD-10-CM | POA: Diagnosis not present

## 2018-12-10 DIAGNOSIS — Z961 Presence of intraocular lens: Secondary | ICD-10-CM | POA: Diagnosis not present

## 2018-12-10 IMAGING — DX DG RIBS 2V*R*
2 series · 2 of 2 positions shown · non-contrast
Comparison: Chest radiograph 10/14/2016

CLINICAL DATA: Right anterior chest pain. Fall for 4 days ago.
Contusion right chest wall, initial encounter.

EXAM:
RIGHT RIBS - 2 VIEW

[rib pa]
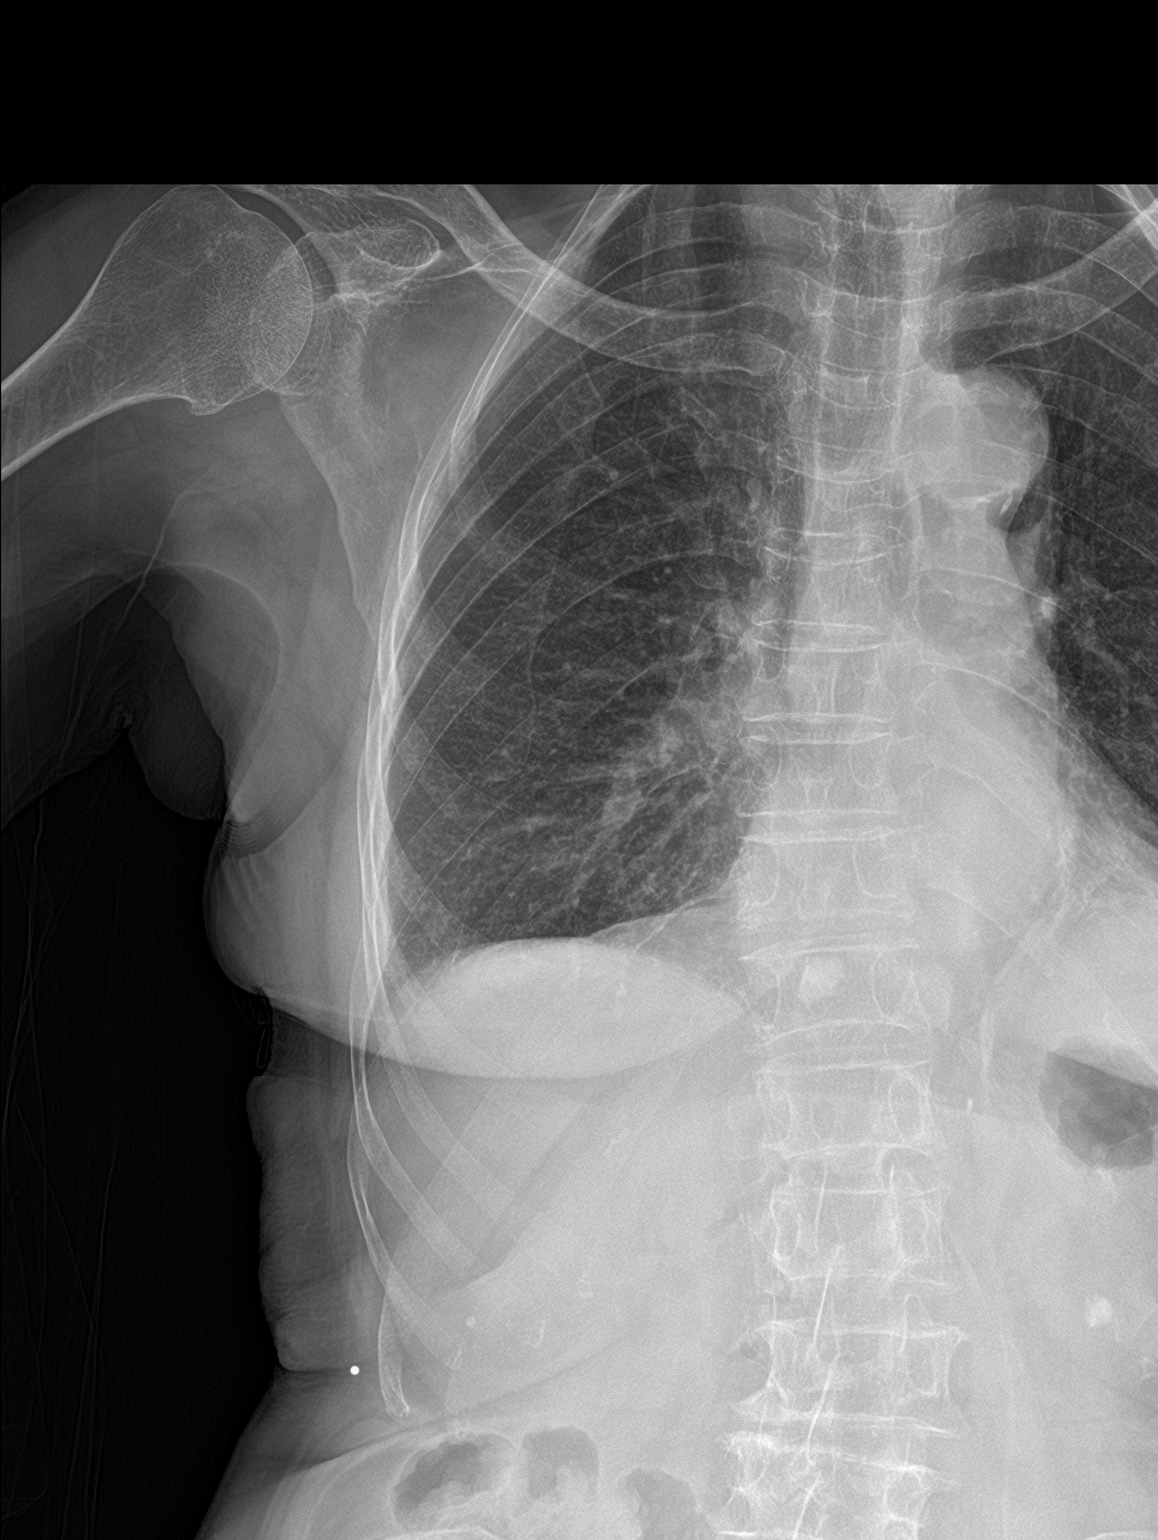

[rib obl]
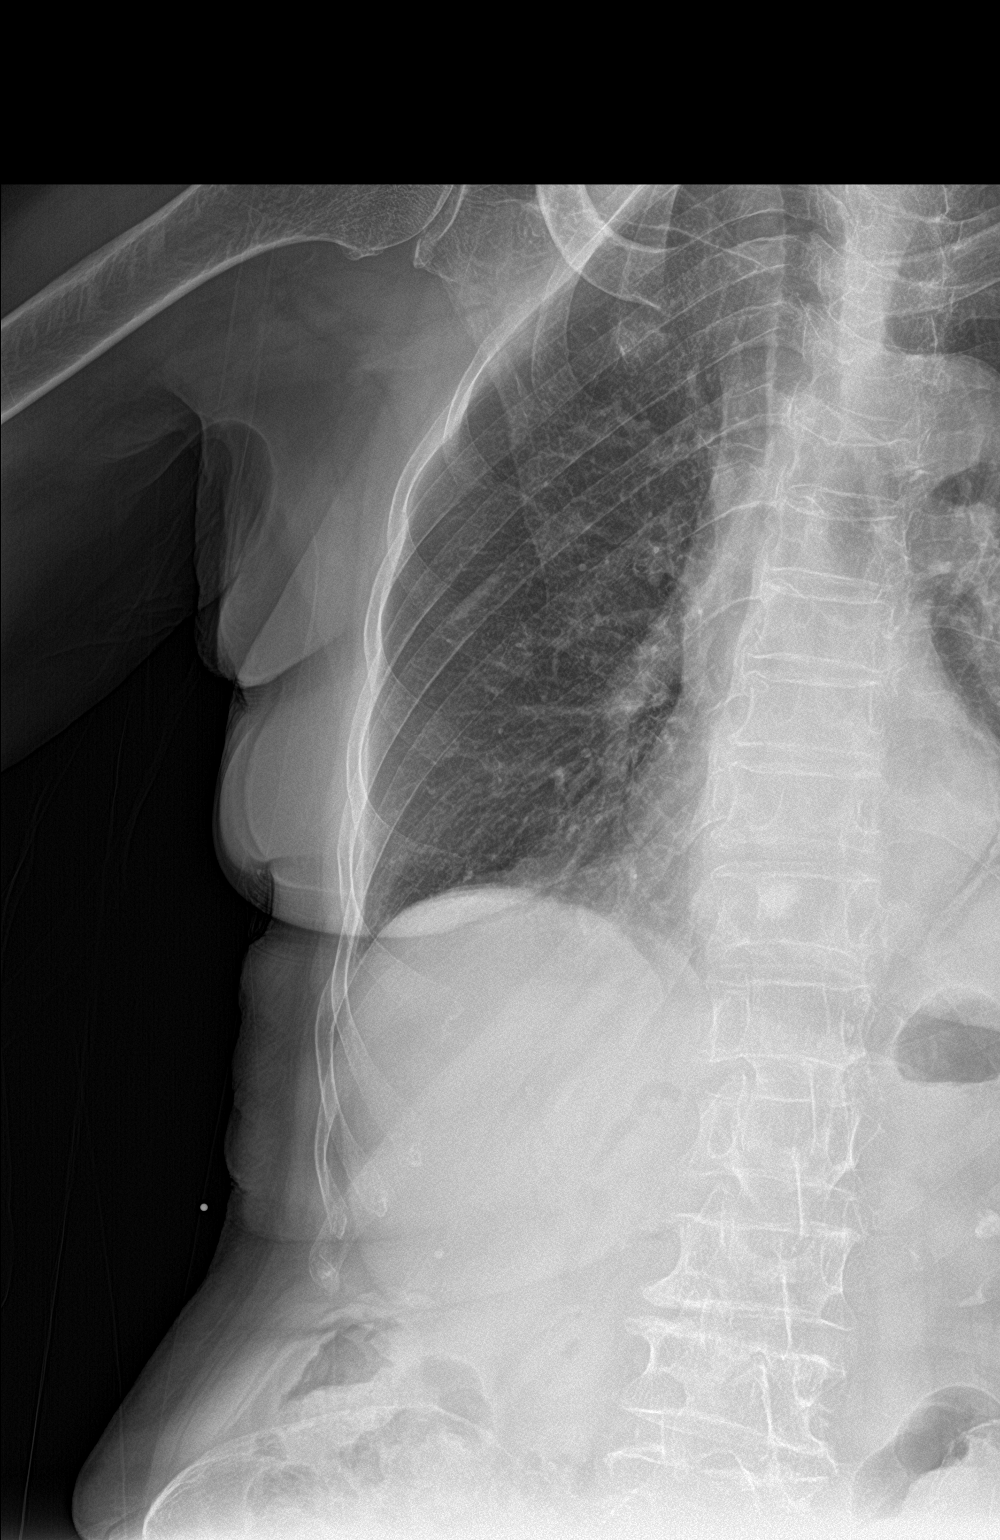

[2 of 2 positions shown; findings below may reference images not displayed]

FINDINGS: Right ribs are intact without a displaced fracture. Negative for
right pneumothorax. No gross abnormality to the right shoulder.
Stable sclerotic area in the T12 vertebral body.
IMPRESSION: No acute abnormality.

## 2019-01-07 DIAGNOSIS — H353211 Exudative age-related macular degeneration, right eye, with active choroidal neovascularization: Secondary | ICD-10-CM | POA: Diagnosis not present

## 2019-02-02 ENCOUNTER — Other Ambulatory Visit: Payer: Self-pay

## 2019-02-02 ENCOUNTER — Ambulatory Visit (INDEPENDENT_AMBULATORY_CARE_PROVIDER_SITE_OTHER): Payer: Medicare Other | Admitting: Internal Medicine

## 2019-02-02 ENCOUNTER — Encounter: Payer: Self-pay | Admitting: Internal Medicine

## 2019-02-02 DIAGNOSIS — H6123 Impacted cerumen, bilateral: Secondary | ICD-10-CM | POA: Diagnosis not present

## 2019-02-02 DIAGNOSIS — I10 Essential (primary) hypertension: Secondary | ICD-10-CM

## 2019-02-02 DIAGNOSIS — G301 Alzheimer's disease with late onset: Secondary | ICD-10-CM

## 2019-02-02 DIAGNOSIS — F0281 Dementia in other diseases classified elsewhere with behavioral disturbance: Secondary | ICD-10-CM

## 2019-02-02 NOTE — Progress Notes (Signed)
Subjective:  Patient ID: Lindsey Dickson, female    DOB: April 03, 1920  Age: 86 y.o. MRN: [id number]  CC: No chief complaint on file.   HPI Lindsey Dickson presents for dementia C/o hearing loss Dtr lives 69 mi away - they were not able to use a patch  Outpatient Medications Prior to Visit  Medication Sig Dispense Refill  . acetaminophen (TYLENOL) 325 MG tablet Take 650 mg by mouth every 6 (six) hours as needed.    [name] b complex vitamins tablet Take 1 tablet by mouth daily. 100 tablet 3  . Cholecalciferol 1000 UNITS tablet Take 1,000 Units by mouth daily.      . magnesium hydroxide (MILK OF MAGNESIA) 400 MG/5ML suspension Take 7.5 mLs by mouth daily as needed for mild constipation. Reported on 06/25/2015    . potassium chloride (KLOR-CON) 8 MEQ tablet Take 1 tablet (8 mEq total) by mouth daily. 90 tablet 3  . rivastigmine (EXELON) 4.6 mg/24hr Place 1 patch (4.6 mg total) onto the skin daily. 30 patch 12   No facility-administered medications prior to visit.    ROS: Review of Systems  Constitutional: Negative for activity change, appetite change, chills, fatigue and unexpected weight change.  HENT: Positive for hearing loss. Negative for congestion, mouth sores and sinus pressure.   Eyes: Negative for visual disturbance.  Respiratory: Negative for cough and chest tightness.   Gastrointestinal: Negative for abdominal pain and nausea.  Genitourinary: Negative for difficulty urinating, frequency and vaginal pain.  Musculoskeletal: Negative for back pain and gait problem.  Skin: Negative for pallor and rash.  Neurological: Negative for dizziness, tremors, weakness, numbness and headaches.  Psychiatric/Behavioral: Positive for behavioral problems, confusion and decreased concentration. Negative for agitation, sleep disturbance and suicidal ideas. The patient is not nervous/anxious.     Objective:  BP 130/84 (BP Location: Left Arm, Patient Position: Sitting, Cuff Size: Normal)   Pulse 77    Temp 97.9 F (36.6 C) (Oral)   Ht 5\' 1"  (1.549 m)   Wt 92 lb (41.7 kg)   SpO2 99%   BMI 17.38 kg/m   BP Readings from Last 3 Encounters:  02/02/19 130/84  07/27/18 132/84  02/03/18 134/82    Wt Readings from Last 3 Encounters:  02/02/19 92 lb (41.7 kg)  07/27/18 91 lb (41.3 kg)  02/03/18 94 lb (42.6 kg)    Physical Exam Constitutional:      General: She is not in acute distress.    Appearance: She is well-developed.  HENT:     Head: Normocephalic.     Right Ear: There is impacted cerumen.     Left Ear: There is impacted cerumen.     Nose: Nose normal.  Eyes:     General:        Right eye: No discharge.        Left eye: No discharge.     Conjunctiva/sclera: Conjunctivae normal.     Pupils: Pupils are equal, round, and reactive to light.  Neck:     Thyroid: No thyromegaly.     Vascular: No JVD.     Trachea: No tracheal deviation.  Cardiovascular:     Rate and Rhythm: Normal rate and regular rhythm.     Heart sounds: Normal heart sounds.  Pulmonary:     Effort: No respiratory distress.     Breath sounds: No stridor. No wheezing.  Abdominal:     General: Bowel sounds are normal. There is no distension.     Palpations:  Abdomen is soft. There is no mass.     Tenderness: There is no abdominal tenderness. There is no guarding or rebound.  Musculoskeletal:        General: No tenderness.     Cervical back: Normal range of motion and neck supple.  Lymphadenopathy:     Cervical: No cervical adenopathy.  Skin:    Findings: No erythema or rash.  Neurological:     Cranial Nerves: No cranial nerve deficit.     Motor: No abnormal muscle tone.     Coordination: Coordination normal.     Deep Tendon Reflexes: Reflexes normal.  Psychiatric:        Behavior: Behavior normal.        Thought Content: Thought content normal.        Judgment: Judgment normal.   B wax   Procedure Note :     Procedure :  Ear irrigation right and left ears   Indication:  Cerumen impaction  right and left ears   Risks, including pain, dizziness, eardrum perforation, bleeding, infection and others as well as benefits were explained to the patient in detail. Verbal consent was obtained and the patient agreed to proceed.    We used "The Elephant Ear Irrigation Device" filled with lukewarm water for irrigation. A large amount wax was recovered from both ears. Procedure has also required manual wax removal/instrumentation with an ear wax curette and ear forceps on the right and left ears.   Tolerated well. Complications: None.   Postprocedure instructions :  Call if problems.    Lab Results  Component Value Date   WBC 5.1 07/27/2018   HGB 13.9 07/27/2018   HCT 42.4 07/27/2018   PLT 249.0 07/27/2018   GLUCOSE 104 (H) 07/27/2018   CHOL 213 (H) 09/02/2016   TRIG 157.0 (H) 09/02/2016   HDL 47.60 09/02/2016   LDLDIRECT 177.6 12/20/2009   LDLCALC 134 (H) 09/02/2016   ALT 7 07/27/2018   AST 15 07/27/2018   NA 141 07/27/2018   K 3.3 (L) 07/27/2018   CL 105 07/27/2018   CREATININE 0.83 07/27/2018   BUN 6 07/27/2018   CO2 26 07/27/2018   TSH 1.04 07/27/2018    DG Ribs Unilateral Right  Result Date: 09/24/2017 CLINICAL DATA:  Right anterior chest pain. Fall for 4 days ago. Contusion right chest wall, initial encounter. EXAM: RIGHT RIBS - 2 VIEW COMPARISON:  Chest radiograph 10/14/2016 FINDINGS: Right ribs are intact without a displaced fracture. Negative for right pneumothorax. No gross abnormality to the right shoulder. Stable sclerotic area in the T12 vertebral body. IMPRESSION: No acute abnormality. Electronically Signed   By: Markus Daft M.D.   On: 09/24/2017 08:19    Assessment & Plan:   There are no diagnoses linked to this encounter.   No orders of the defined types were placed in this encounter.    Follow-up: No follow-ups on file.  Walker Kehr, MD

## 2019-02-02 NOTE — Assessment & Plan Note (Signed)
  BP Readings from Last 3 Encounters:  02/02/19 130/84  07/27/18 132/84  02/03/18 134/82

## 2019-02-02 NOTE — Assessment & Plan Note (Signed)
Dtr lives 11 mi away - they were not able to use a patch

## 2019-02-02 NOTE — Assessment & Plan Note (Signed)
See procedure 

## 2019-02-08 DIAGNOSIS — H353211 Exudative age-related macular degeneration, right eye, with active choroidal neovascularization: Secondary | ICD-10-CM | POA: Diagnosis not present

## 2019-03-30 ENCOUNTER — Encounter: Payer: Self-pay | Admitting: Internal Medicine

## 2019-03-30 ENCOUNTER — Ambulatory Visit (INDEPENDENT_AMBULATORY_CARE_PROVIDER_SITE_OTHER): Payer: Medicare Other | Admitting: Internal Medicine

## 2019-03-30 ENCOUNTER — Other Ambulatory Visit: Payer: Self-pay

## 2019-03-30 DIAGNOSIS — H9193 Unspecified hearing loss, bilateral: Secondary | ICD-10-CM

## 2019-03-30 DIAGNOSIS — H9190 Unspecified hearing loss, unspecified ear: Secondary | ICD-10-CM | POA: Insufficient documentation

## 2019-03-30 DIAGNOSIS — F02818 Dementia in other diseases classified elsewhere, unspecified severity, with other behavioral disturbance: Secondary | ICD-10-CM

## 2019-03-30 DIAGNOSIS — G301 Alzheimer's disease with late onset: Secondary | ICD-10-CM

## 2019-03-30 DIAGNOSIS — H6123 Impacted cerumen, bilateral: Secondary | ICD-10-CM | POA: Diagnosis not present

## 2019-03-30 DIAGNOSIS — I10 Essential (primary) hypertension: Secondary | ICD-10-CM | POA: Diagnosis not present

## 2019-03-30 DIAGNOSIS — F0281 Dementia in other diseases classified elsewhere with behavioral disturbance: Secondary | ICD-10-CM

## 2019-03-30 NOTE — Assessment & Plan Note (Signed)
L>R  Refractory wax ENT ref

## 2019-03-30 NOTE — Assessment & Plan Note (Signed)
ENT ref 

## 2019-03-30 NOTE — Assessment & Plan Note (Signed)
Unchanged

## 2019-03-30 NOTE — Progress Notes (Signed)
Subjective:  Patient ID: Lindsey Dickson, female    DOB: [birth year]  Age: 86 y.o. MRN: [id number]  CC: No chief complaint on file.   HPI Lindsey Dickson presents for HTN, dementia  C/o hearing loss L ear  Outpatient Medications Prior to Visit  Medication Sig Dispense Refill  . acetaminophen (TYLENOL) 325 MG tablet Take 650 mg by mouth every 6 (six) hours as needed.    [name] b complex vitamins tablet Take 1 tablet by mouth daily. 100 tablet 3  . Cholecalciferol 1000 UNITS tablet Take 1,000 Units by mouth daily.      . magnesium hydroxide (MILK OF MAGNESIA) 400 MG/5ML suspension Take 7.5 mLs by mouth daily as needed for mild constipation. Reported on 06/25/2015    . potassium chloride (KLOR-CON) 8 MEQ tablet Take 1 tablet (8 mEq total) by mouth daily. 90 tablet 3  . rivastigmine (EXELON) 4.6 mg/24hr Place 1 patch (4.6 mg total) onto the skin daily. 30 patch 12   No facility-administered medications prior to visit.    ROS: Review of Systems  Constitutional: Positive for appetite change. Negative for activity change, chills, fatigue and unexpected weight change.  HENT: Positive for hearing loss. Negative for congestion, mouth sores, sinus pressure, sore throat and trouble swallowing.   Eyes: Negative for visual disturbance.  Respiratory: Negative for cough and chest tightness.   Gastrointestinal: Negative for abdominal pain and nausea.  Genitourinary: Negative for difficulty urinating, frequency and vaginal pain.  Musculoskeletal: Negative for back pain and gait problem.  Skin: Negative for pallor and rash.  Neurological: Negative for dizziness, tremors, weakness, numbness and headaches.  Psychiatric/Behavioral: Positive for confusion. Negative for hallucinations, sleep disturbance and suicidal ideas. The patient is nervous/anxious.     Objective:  BP 126/82 (BP Location: Left Arm, Patient Position: Sitting, Cuff Size: Normal)   Pulse 79   Temp 98.2 F (36.8 C) (Oral)   Ht 5\' 1"  (1.549 m)    Wt 94 lb (42.6 kg)   SpO2 98%   BMI 17.76 kg/m   BP Readings from Last 3 Encounters:  03/30/19 126/82  02/02/19 130/84  07/27/18 132/84    Wt Readings from Last 3 Encounters:  03/30/19 94 lb (42.6 kg)  02/02/19 92 lb (41.7 kg)  07/27/18 91 lb (41.3 kg)    Physical Exam Constitutional:      General: She is not in acute distress.    Appearance: Normal appearance. She is well-developed and normal weight.  HENT:     Head: Normocephalic.     Right Ear: External ear normal.     Left Ear: External ear normal.     Nose: Nose normal.  Eyes:     General:        Right eye: No discharge.        Left eye: No discharge.     Conjunctiva/sclera: Conjunctivae normal.     Pupils: Pupils are equal, round, and reactive to light.  Neck:     Thyroid: No thyromegaly.     Vascular: No JVD.     Trachea: No tracheal deviation.  Cardiovascular:     Rate and Rhythm: Normal rate and regular rhythm.     Heart sounds: Normal heart sounds.  Pulmonary:     Effort: No respiratory distress.     Breath sounds: No stridor. No wheezing.  Abdominal:     General: Bowel sounds are normal. There is no distension.     Palpations: Abdomen is soft. There is no mass.  Tenderness: There is no abdominal tenderness. There is no guarding or rebound.  Musculoskeletal:        General: No tenderness.     Cervical back: Normal range of motion and neck supple.  Lymphadenopathy:     Cervical: No cervical adenopathy.  Skin:    Findings: No erythema or rash.  Neurological:     Cranial Nerves: No cranial nerve deficit.     Motor: No abnormal muscle tone.     Coordination: Coordination normal.     Deep Tendon Reflexes: Reflexes normal.  Psychiatric:        Mood and Affect: Mood normal.        Behavior: Behavior normal.    B wax Alert, cooperative Thin but well nourished  Lab Results  Component Value Date   WBC 5.1 07/27/2018   HGB 13.9 07/27/2018   HCT 42.4 07/27/2018   PLT 249.0 07/27/2018    GLUCOSE 104 (H) 07/27/2018   CHOL 213 (H) 09/02/2016   TRIG 157.0 (H) 09/02/2016   HDL 47.60 09/02/2016   LDLDIRECT 177.6 12/20/2009   LDLCALC 134 (H) 09/02/2016   ALT 7 07/27/2018   AST 15 07/27/2018   NA 141 07/27/2018   K 3.3 (L) 07/27/2018   CL 105 07/27/2018   CREATININE 0.83 07/27/2018   BUN 6 07/27/2018   CO2 26 07/27/2018   TSH 1.04 07/27/2018    DG Ribs Unilateral Right  Result Date: 09/24/2017 CLINICAL DATA:  Right anterior chest pain. Fall for 4 days ago. Contusion right chest wall, initial encounter. EXAM: RIGHT RIBS - 2 VIEW COMPARISON:  Chest radiograph 10/14/2016 FINDINGS: Right ribs are intact without a displaced fracture. Negative for right pneumothorax. No gross abnormality to the right shoulder. Stable sclerotic area in the T12 vertebral body. IMPRESSION: No acute abnormality. Electronically Signed   By: Markus Daft M.D.   On: 09/24/2017 08:19    Assessment & Plan:    Lindsey Kehr, MD

## 2019-03-30 NOTE — Assessment & Plan Note (Signed)
BP Readings from Last 3 Encounters:  03/30/19 126/82  02/02/19 130/84  07/27/18 132/84

## 2019-04-08 DIAGNOSIS — H353211 Exudative age-related macular degeneration, right eye, with active choroidal neovascularization: Secondary | ICD-10-CM | POA: Diagnosis not present

## 2019-04-08 DIAGNOSIS — H35371 Puckering of macula, right eye: Secondary | ICD-10-CM | POA: Diagnosis not present

## 2019-04-08 DIAGNOSIS — Z961 Presence of intraocular lens: Secondary | ICD-10-CM | POA: Diagnosis not present

## 2019-04-08 DIAGNOSIS — H353123 Nonexudative age-related macular degeneration, left eye, advanced atrophic without subfoveal involvement: Secondary | ICD-10-CM | POA: Diagnosis not present

## 2019-05-03 DIAGNOSIS — H93293 Other abnormal auditory perceptions, bilateral: Secondary | ICD-10-CM | POA: Diagnosis not present

## 2019-05-03 DIAGNOSIS — H6123 Impacted cerumen, bilateral: Secondary | ICD-10-CM | POA: Diagnosis not present

## 2019-05-20 DIAGNOSIS — H35371 Puckering of macula, right eye: Secondary | ICD-10-CM | POA: Diagnosis not present

## 2019-05-20 DIAGNOSIS — H353211 Exudative age-related macular degeneration, right eye, with active choroidal neovascularization: Secondary | ICD-10-CM | POA: Diagnosis not present

## 2019-05-20 DIAGNOSIS — Z961 Presence of intraocular lens: Secondary | ICD-10-CM | POA: Diagnosis not present

## 2019-05-20 DIAGNOSIS — H353123 Nonexudative age-related macular degeneration, left eye, advanced atrophic without subfoveal involvement: Secondary | ICD-10-CM | POA: Diagnosis not present

## 2019-05-23 DIAGNOSIS — H524 Presbyopia: Secondary | ICD-10-CM | POA: Diagnosis not present

## 2019-05-23 DIAGNOSIS — H52203 Unspecified astigmatism, bilateral: Secondary | ICD-10-CM | POA: Diagnosis not present

## 2019-05-23 DIAGNOSIS — H5203 Hypermetropia, bilateral: Secondary | ICD-10-CM | POA: Diagnosis not present

## 2019-06-02 ENCOUNTER — Ambulatory Visit (INDEPENDENT_AMBULATORY_CARE_PROVIDER_SITE_OTHER): Payer: Medicare Other | Admitting: Internal Medicine

## 2019-06-02 ENCOUNTER — Ambulatory Visit: Payer: Medicare Other | Admitting: Internal Medicine

## 2019-06-02 ENCOUNTER — Other Ambulatory Visit: Payer: Self-pay

## 2019-06-02 ENCOUNTER — Encounter: Payer: Self-pay | Admitting: Internal Medicine

## 2019-06-02 DIAGNOSIS — I10 Essential (primary) hypertension: Secondary | ICD-10-CM

## 2019-06-02 DIAGNOSIS — K5901 Slow transit constipation: Secondary | ICD-10-CM | POA: Diagnosis not present

## 2019-06-02 DIAGNOSIS — H9193 Unspecified hearing loss, bilateral: Secondary | ICD-10-CM | POA: Diagnosis not present

## 2019-06-02 DIAGNOSIS — R159 Full incontinence of feces: Secondary | ICD-10-CM | POA: Diagnosis not present

## 2019-06-02 NOTE — Assessment & Plan Note (Signed)
New D/c enemas due to stool incontinence Has not started her Exelon yet

## 2019-06-02 NOTE — Progress Notes (Signed)
Subjective:  Patient ID: Lindsey Dickson, female    DOB: January 01, 1921  Age: 86 y.o. MRN: [id number]  CC: No chief complaint on file.   HPI   Lindsey Dickson presents for dementia, confusion, HTN f/u C/o bowel accidents - using douches for enemas with unknown frequency.  Pt had been using MOM for years  Has not started her Exelon yet   Outpatient Medications Prior to Visit  Medication Sig Dispense Refill  . acetaminophen (TYLENOL) 325 MG tablet Take 650 mg by mouth every 6 (six) hours as needed.    [name] b complex vitamins tablet Take 1 tablet by mouth daily. 100 tablet 3  . Cholecalciferol 1000 UNITS tablet Take 1,000 Units by mouth daily.      . magnesium hydroxide (MILK OF MAGNESIA) 400 MG/5ML suspension Take 7.5 mLs by mouth daily as needed for mild constipation. Reported on 06/25/2015    . potassium chloride (KLOR-CON) 8 MEQ tablet Take 1 tablet (8 mEq total) by mouth daily. 90 tablet 3  . rivastigmine (EXELON) 4.6 mg/24hr Place 1 patch (4.6 mg total) onto the skin daily. 30 patch 12   No facility-administered medications prior to visit.    ROS: Review of Systems  Constitutional: Negative for activity change, appetite change, chills, fatigue and unexpected weight change.  HENT: Negative for congestion, mouth sores and sinus pressure.   Eyes: Negative for visual disturbance.  Respiratory: Negative for cough and chest tightness.   Gastrointestinal: Positive for constipation. Negative for abdominal pain and nausea.  Genitourinary: Negative for difficulty urinating, frequency and vaginal pain.  Musculoskeletal: Negative for back pain and gait problem.  Skin: Negative for pallor and rash.  Neurological: Negative for dizziness, tremors, weakness, numbness and headaches.  Psychiatric/Behavioral: Positive for confusion. Negative for sleep disturbance and suicidal ideas.    Objective:  BP (!) 164/78 (BP Location: Left Arm, Patient Position: Sitting, Cuff Size: Normal)   Pulse 82   Temp  98.4 F (36.9 C) (Oral)   Ht 5\' 1"  (1.549 m)   Wt 93 lb (42.2 kg)   SpO2 99%   BMI 17.57 kg/m   BP Readings from Last 3 Encounters:  06/02/19 (!) 164/78  03/30/19 126/82  02/02/19 130/84    Wt Readings from Last 3 Encounters:  06/02/19 93 lb (42.2 kg)  03/30/19 94 lb (42.6 kg)  02/02/19 92 lb (41.7 kg)    Physical Exam Constitutional:      General: She is not in acute distress.    Appearance: She is well-developed.  HENT:     Head: Normocephalic.     Right Ear: External ear normal.     Left Ear: External ear normal.     Nose: Nose normal.  Eyes:     General:        Right eye: No discharge.        Left eye: No discharge.     Conjunctiva/sclera: Conjunctivae normal.     Pupils: Pupils are equal, round, and reactive to light.  Neck:     Thyroid: No thyromegaly.     Vascular: No JVD.     Trachea: No tracheal deviation.  Cardiovascular:     Rate and Rhythm: Normal rate and regular rhythm.     Heart sounds: Normal heart sounds.  Pulmonary:     Effort: No respiratory distress.     Breath sounds: No stridor. No wheezing.  Abdominal:     General: Bowel sounds are normal. There is no distension.  Palpations: Abdomen is soft. There is no mass.     Tenderness: There is no abdominal tenderness. There is no guarding or rebound.  Musculoskeletal:        General: No tenderness.     Cervical back: Normal range of motion and neck supple.  Lymphadenopathy:     Cervical: No cervical adenopathy.  Skin:    Findings: No erythema or rash.  Neurological:     Cranial Nerves: No cranial nerve deficit.     Motor: No abnormal muscle tone.     Coordination: Coordination normal.     Deep Tendon Reflexes: Reflexes normal.  Psychiatric:        Behavior: Behavior normal.        Thought Content: Thought content normal.     Lab Results  Component Value Date   WBC 5.1 07/27/2018   HGB 13.9 07/27/2018   HCT 42.4 07/27/2018   PLT 249.0 07/27/2018   GLUCOSE 104 (H) 07/27/2018    CHOL 213 (H) 09/02/2016   TRIG 157.0 (H) 09/02/2016   HDL 47.60 09/02/2016   LDLDIRECT 177.6 12/20/2009   LDLCALC 134 (H) 09/02/2016   ALT 7 07/27/2018   AST 15 07/27/2018   NA 141 07/27/2018   K 3.3 (L) 07/27/2018   CL 105 07/27/2018   CREATININE 0.83 07/27/2018   BUN 6 07/27/2018   CO2 26 07/27/2018   TSH 1.04 07/27/2018    DG Ribs Unilateral Right  Result Date: 09/24/2017 CLINICAL DATA:  Right anterior chest pain. Fall for 4 days ago. Contusion right chest wall, initial encounter. EXAM: RIGHT RIBS - 2 VIEW COMPARISON:  Chest radiograph 10/14/2016 FINDINGS: Right ribs are intact without a displaced fracture. Negative for right pneumothorax. No gross abnormality to the right shoulder. Stable sclerotic area in the T12 vertebral body. IMPRESSION: No acute abnormality. Electronically Signed   By: Markus Daft M.D.   On: 09/24/2017 08:19    Assessment & Plan:   There are no diagnoses linked to this encounter.   No orders of the defined types were placed in this encounter.    Follow-up: No follow-ups on file.  Walker Kehr, MD

## 2019-06-02 NOTE — Assessment & Plan Note (Signed)
D/c enemas due to stool incontinence Has not started her Exelon yet

## 2019-06-02 NOTE — Assessment & Plan Note (Signed)
Better after wax removal - dr Constance Holster

## 2019-06-02 NOTE — Assessment & Plan Note (Signed)
NAS diet 

## 2019-06-06 ENCOUNTER — Telehealth: Payer: Self-pay | Admitting: Internal Medicine

## 2019-06-06 NOTE — Telephone Encounter (Signed)
Hassan Rowan Lea-daughter called reference getting her mom into an assisted living facility and needs an FL2 completed.  Provider: Plot[place]

## 2019-06-06 NOTE — Telephone Encounter (Signed)
Spoke with daughter she is unsure where she wants to send her mother at this time. She is going to think about things and call around to some places and let us know if and when she needs the FL2.

## 2019-06-09 NOTE — Telephone Encounter (Signed)
New message:   Pt's daughter is calling and is stating she needs a standard FL2 form filled out to even inquire about getting her mother placed anywhere. Please advise when it is ready to pick up.

## 2019-06-14 NOTE — Telephone Encounter (Signed)
LVM for Lindsey Dickson to call back and I have some follow up questions to go over with her about FL2.

## 2019-06-14 NOTE — Telephone Encounter (Signed)
Spoke with daughter, Lindsey Dickson has been completed & Placed in providers box to review and sign.

## 2019-06-16 NOTE — Telephone Encounter (Signed)
Signed, Copy sent to scan.   Daughter informed and will pick up original.

## 2019-06-27 NOTE — Progress Notes (Signed)
Subjective:    Patient ID: Lindsey Dickson, female    DOB: 12-24-1920, 86 y.o.   MRN: [id number]  HPI The patient is here for an acute visit.  She is here with her daughter who provides the history because of her advanced dementia.  She is currently not taking any medication.  She refuses to take anything.  Frequent urination:  The past few weeks she has not been eating much and has been urinating frequently.  Her daughter also notes that for a while she was taking Fleet enemas and MiraLAX and was having frequent bowel movements/diarrhea.  She has complained of her stomach hurting and not feeling well at times.  She is not able to say if her urine has a different appearance or is cloudy.  There has been no known fever.  Her daughter does not live with her mother and no further information is available.    Her daughter states her memory varies, but there has not been an acute worsening.  Reviewed PCPs last note.  Dementia, confusion or worsening as of last month.   Medications and allergies reviewed with patient and updated if appropriate.  Patient Active Problem List   Diagnosis Date Noted  . Stool incontinence 06/02/2019  . Hearing loss 03/30/2019  . Chest wall contusion 09/23/2017  . Toe contusion 07/09/2017  . Leg pain, diffuse, right 07/02/2017  . Bursitis, hip 11/11/2016  . Chest pain, atypical 10/14/2016  . Alzheimer disease ([place]) 10/01/2016  . Decreased hearing of both ears 09/03/2015  . Sinusitis, acute 10/03/2014  . Headache 12/28/2013  . Non-compliant behavior 08/16/2013  . Cerumen impaction 02/03/2013  . Rash and nonspecific skin eruption 04/29/2012  . Nausea with vomiting 04/29/2012  . Hallucination 04/14/2012  . Rash 10/17/2011  . Anxiety 10/17/2011  . Well adult exam 07/25/2011  . Weight loss 06/11/2011  . Stress 12/30/2010  . ABDOMINAL BLOATING 02/15/2010  . Abdominal pain, epigastric 09/19/2009  . NECK PAIN 09/05/2009  . BACK PAIN, THORACIC REGION  09/05/2009  . FALL, HX OF 09/05/2009  . HYPOKALEMIA 04/11/2009  . SHOULDER PAIN 04/17/2008  . Edema 10/15/2007  . CHEST WALL PAIN 08/31/2007  . Constipation 05/04/2007  . CYSTITIS 04/16/2007  . THYROID NODULE 03/26/2007  . Essential hypertension 03/26/2007  . Unspecified cardiovascular disease 03/26/2007  . GASTRITIS 03/26/2007  . NEPHROLITHIASIS 03/26/2007  . Insomnia 03/26/2007  . Dyslipidemia 09/12/2006  . Depression 09/12/2006  . Congestive heart failure ([place]) 09/12/2006  . ALLERGIC RHINITIS 09/12/2006  . GERD 09/12/2006  . IBS 09/12/2006  . LOW BACK PAIN 09/12/2006  . COLONIC POLYPS, HX OF 09/12/2006    Current Outpatient Medications on File Prior to Visit  Medication Sig Dispense Refill  . acetaminophen (TYLENOL) 325 MG tablet Take 650 mg by mouth every 6 (six) hours as needed.    [name] b complex vitamins tablet Take 1 tablet by mouth daily. 100 tablet 3  . Cholecalciferol 1000 UNITS tablet Take 1,000 Units by mouth daily.      . magnesium hydroxide (MILK OF MAGNESIA) 400 MG/5ML suspension Take 7.5 mLs by mouth daily as needed for mild constipation. Reported on 06/25/2015    . potassium chloride (KLOR-CON) 8 MEQ tablet Take 1 tablet (8 mEq total) by mouth daily. 90 tablet 3  . rivastigmine (EXELON) 4.6 mg/24hr Place 1 patch (4.6 mg total) onto the skin daily. 30 patch 12   No current facility-administered medications on file prior to visit.    Past Medical History:  Diagnosis  Date  . Allergic rhinitis   . CHF (congestive heart failure) ([place])   . Colon polyps   . Constipation, chronic   . Depression   . Diverticulitis   . GERD (gastroesophageal reflux disease)   . Hyperlipidemia   . Hypertension   . IBS (irritable bowel syndrome)   . LBP (low back pain)     Past Surgical History:  Procedure Laterality Date  . APPENDECTOMY    . CATARACT EXTRACTION    . TUBAL LIGATION      Social History   Socioeconomic History  . Marital status: Single    Spouse name: Not  on file  . Number of children: Not on file  . Years of education: Not on file  . Highest education level: Not on file  Occupational History  . Not on file  Tobacco Use  . Smoking status: Never Smoker  . Smokeless tobacco: Never Used  Substance and Sexual Activity  . Alcohol use: No  . Drug use: No  . Sexual activity: Never  Other Topics Concern  . Not on file  Social History Narrative  . Not on file   Social Determinants of Health   Financial Resource Strain:   . Difficulty of Paying Living Expenses:   Food Insecurity:   . Worried About Charity fundraiser in the Last Year:   . Arboriculturist in the Last Year:   Transportation Needs:   . Film/video editor (Medical):   [name] Lack of Transportation (Non-Medical):   Physical Activity:   . Days of Exercise per Week:   . Minutes of Exercise per Session:   Stress:   . Feeling of Stress :   Social Connections:   . Frequency of Communication with Friends and Family:   . Frequency of Social Gatherings with Friends and Family:   . Attends Religious Services:   . Active Member of Clubs or Organizations:   . Attends Archivist Meetings:   [name] Marital Status:     Family History  Problem Relation Age of Onset  . Hypertension Mother   . Lung cancer Brother   . Cancer Brother        lung    Review of Systems  Constitutional: Positive for appetite change (dec). Negative for chills and fever.  Gastrointestinal: Positive for abdominal pain and constipation.  Genitourinary: Positive for frequency.       Objective:   Vitals:   06/28/19 1047  BP: 136/80  Pulse: 86  Temp: 98.1 F (36.7 C)  SpO2: 96%   BP Readings from Last 3 Encounters:  06/28/19 136/80  06/02/19 (!) 164/78  03/30/19 126/82   Wt Readings from Last 3 Encounters:  06/28/19 91 lb 9.6 oz (41.5 kg)  06/02/19 93 lb (42.2 kg)  03/30/19 94 lb (42.6 kg)   Body mass index is 17.31 kg/m.   Physical Exam Constitutional:      General: She is  not in acute distress.    Appearance: Normal appearance. She is not ill-appearing, toxic-appearing or diaphoretic.  HENT:     Head: Normocephalic and atraumatic.  Cardiovascular:     Rate and Rhythm: Normal rate and regular rhythm.     Heart sounds: Murmur present.  Pulmonary:     Effort: Pulmonary effort is normal. No respiratory distress.     Breath sounds: No wheezing.  Abdominal:     Palpations: Abdomen is soft. There is no mass.     Tenderness: There is abdominal  tenderness (suprapubic - mild). There is no guarding or rebound.  Musculoskeletal:     Right lower leg: No edema.     Left lower leg: No edema.  Skin:    General: Skin is warm and dry.  Neurological:     Mental Status: She is alert.        Last blood work from 07/2018 reviewed.  No recent urine cultures.  Lab Results  Component Value Date   WBC 5.1 07/27/2018   HGB 13.9 07/27/2018   HCT 42.4 07/27/2018   PLT 249.0 07/27/2018   GLUCOSE 104 (H) 07/27/2018   CHOL 213 (H) 09/02/2016   TRIG 157.0 (H) 09/02/2016   HDL 47.60 09/02/2016   LDLDIRECT 177.6 12/20/2009   LDLCALC 134 (H) 09/02/2016   ALT 7 07/27/2018   AST 15 07/27/2018   NA 141 07/27/2018   K 3.3 (L) 07/27/2018   CL 105 07/27/2018   CREATININE 0.83 07/27/2018   BUN 6 07/27/2018   CO2 26 07/27/2018   TSH 1.04 07/27/2018      Assessment & Plan:    See Problem List for Assessment and Plan of chronic medical problems.    This visit occurred during the SARS-CoV-2 public health emergency.  Safety protocols were in place, including screening questions prior to the visit, additional usage of staff PPE, and extensive cleaning of exam room while observing appropriate contact time as indicated for disinfecting solutions.

## 2019-06-28 ENCOUNTER — Ambulatory Visit (INDEPENDENT_AMBULATORY_CARE_PROVIDER_SITE_OTHER): Payer: Medicare Other | Admitting: Internal Medicine

## 2019-06-28 ENCOUNTER — Other Ambulatory Visit: Payer: Self-pay

## 2019-06-28 ENCOUNTER — Encounter: Payer: Self-pay | Admitting: Internal Medicine

## 2019-06-28 VITALS — BP 136/80 | HR 86 | Temp 98.1°F | Ht 61.0 in | Wt 91.6 lb

## 2019-06-28 DIAGNOSIS — K5901 Slow transit constipation: Secondary | ICD-10-CM | POA: Diagnosis not present

## 2019-06-28 DIAGNOSIS — R10819 Abdominal tenderness, unspecified site: Secondary | ICD-10-CM | POA: Diagnosis not present

## 2019-06-28 DIAGNOSIS — R350 Frequency of micturition: Secondary | ICD-10-CM

## 2019-06-28 LAB — COMPREHENSIVE METABOLIC PANEL
ALT: 10 U/L (ref 0–35)
AST: 19 U/L (ref 0–37)
Albumin: 3.5 g/dL (ref 3.5–5.2)
Alkaline Phosphatase: 93 U/L (ref 39–117)
BUN: 8 mg/dL (ref 6–23)
CO2: 31 mEq/L (ref 19–32)
Calcium: 9.3 mg/dL (ref 8.4–10.5)
Chloride: 101 mEq/L (ref 96–112)
Creatinine, Ser: 0.84 mg/dL (ref 0.40–1.20)
GFR: 62.68 mL/min (ref >60.00)
Glucose, Bld: 107 mg/dL — ABNORMAL HIGH (ref 70–99)
Potassium: 3.1 mEq/L — ABNORMAL LOW (ref 3.5–5.1)
Sodium: 139 mEq/L (ref 135–145)
Total Bilirubin: 0.8 mg/dL (ref 0.2–1.2)
Total Protein: 7 g/dL (ref 6.0–8.3)

## 2019-06-28 LAB — CBC WITH DIFFERENTIAL/PLATELET
Basophils Absolute: 0 10*3/uL (ref 0.0–0.1)
Basophils Relative: 0.9 % (ref 0.0–3.0)
Eosinophils Absolute: 0.1 10*3/uL (ref 0.0–0.7)
Eosinophils Relative: 1.4 % (ref 0.0–5.0)
HCT: 42.1 % (ref 36.0–46.0)
Hemoglobin: 13.9 g/dL (ref 12.0–15.0)
Lymphocytes Relative: 29.4 % (ref 12.0–46.0)
Lymphs Abs: 1.3 10*3/uL (ref 0.7–4.0)
MCHC: 33.1 g/dL (ref 30.0–36.0)
MCV: 86.5 fl (ref 78.0–100.0)
Monocytes Absolute: 0.6 10*3/uL (ref 0.1–1.0)
Monocytes Relative: 14.1 % — ABNORMAL HIGH (ref 3.0–12.0)
Neutro Abs: 2.5 10*3/uL (ref 1.4–7.7)
Neutrophils Relative %: 54.2 % (ref 43.0–77.0)
Platelets: 252 10*3/uL (ref 150.0–400.0)
RBC: 4.87 Mil/uL (ref 3.87–5.11)
RDW: 14 % (ref 11.5–15.5)
WBC: 4.5 10*3/uL (ref 4.0–10.5)

## 2019-06-28 NOTE — Assessment & Plan Note (Signed)
Chronic Recently she was using enemas and over-the-counter medications to help her go to the bathroom, which caused diarrhea and incontinence Her daughter did take all of this out of the house and her stools are better, but not completely back to normal

## 2019-06-28 NOTE — Assessment & Plan Note (Signed)
Acute Experiencing frequent urination associated with decreased appetite, lower abdominal pain History is very limited Need to rule out UTI-UA, urine culture ordered We will also check CBC, CMP Will hold off on prescribing medication until the results of the urine come back

## 2019-06-28 NOTE — Assessment & Plan Note (Signed)
Acute She does have some suprapubic tenderness on exam and has complained about some abdominal pain Has had some urinary frequency, decreased appetite ?  Related to UTI versus possible chronic constipation Tender on exam without rebound or guarding Will check UA, urine culture, CBC, CMP Depending on the results and if her symptoms improve/worsen may need to consider further evaluation

## 2019-06-28 NOTE — Patient Instructions (Addendum)
  Blood work was ordered.     Medications reviewed and updated.  Changes include : none       Please call if there is no improvement in your symptoms.

## 2019-06-30 ENCOUNTER — Telehealth: Payer: Self-pay | Admitting: Internal Medicine

## 2019-06-30 NOTE — Telephone Encounter (Signed)
Patient's daughter informed of 06/28/19 lab results. See labs.

## 2019-06-30 NOTE — Telephone Encounter (Signed)
    Please return call to daughter to discuss lab results

## 2019-07-04 ENCOUNTER — Ambulatory Visit: Payer: Medicare Other | Admitting: Internal Medicine

## 2019-07-07 ENCOUNTER — Telehealth: Payer: Self-pay | Admitting: Internal Medicine

## 2019-07-07 NOTE — Telephone Encounter (Signed)
New Message:    Pt's daughter is calling and states she is trying to get the pt into an assisted living home. She states she is needing to discuss what all is needed. She states she has had the FL2 filled out but states they are stating something about x-rays and certain shots. Please advise.

## 2019-07-07 NOTE — Telephone Encounter (Signed)
I spoke to patient's daughter, Hassan Rowan. She states the 2 facilities she is considering for her mother is requiring signed FL2 in last 30 days, TB testing, labs, meds/allergies, problem list. Patient has no immunizations on file. OV scheduled 07/13/19 to get everything up to date so patient can move in to facility.   *Note: # 11 on FL2 form signed 06/14/19 needs to be updated to state "Domiciliary" and the other box needs to state "secured"

## 2019-07-13 ENCOUNTER — Ambulatory Visit (INDEPENDENT_AMBULATORY_CARE_PROVIDER_SITE_OTHER): Payer: Medicare Other

## 2019-07-13 ENCOUNTER — Encounter: Payer: Self-pay | Admitting: Internal Medicine

## 2019-07-13 ENCOUNTER — Ambulatory Visit (INDEPENDENT_AMBULATORY_CARE_PROVIDER_SITE_OTHER): Payer: Medicare Other | Admitting: Internal Medicine

## 2019-07-13 ENCOUNTER — Other Ambulatory Visit: Payer: Self-pay

## 2019-07-13 VITALS — BP 168/90 | HR 88 | Temp 98.2°F | Ht 61.0 in | Wt 87.0 lb

## 2019-07-13 DIAGNOSIS — Z23 Encounter for immunization: Secondary | ICD-10-CM | POA: Diagnosis not present

## 2019-07-13 DIAGNOSIS — R634 Abnormal weight loss: Secondary | ICD-10-CM

## 2019-07-13 NOTE — Progress Notes (Signed)
Subjective:  Patient ID: Lindsey Dickson, female    DOB: 1920/02/18  Age: 86 y.o. MRN: [id number]  CC: No chief complaint on file.   HPI Lindsey Dickson presents for FTT, wt loss, dementia Needs FL2 form filled out for assisted living Refusing to eat at times  Outpatient Medications Prior to Visit  Medication Sig Dispense Refill  . magnesium hydroxide (MILK OF MAGNESIA) 400 MG/5ML suspension Take 7.5 mLs by mouth daily as needed for mild constipation. Reported on 06/25/2015    . acetaminophen (TYLENOL) 325 MG tablet Take 650 mg by mouth every 6 (six) hours as needed. (Patient not taking: Reported on 07/13/2019)    . b complex vitamins tablet Take 1 tablet by mouth daily. (Patient not taking: Reported on 07/13/2019) 100 tablet 3  . Cholecalciferol 1000 UNITS tablet Take 1,000 Units by mouth daily.   (Patient not taking: Reported on 07/13/2019)    . potassium chloride (KLOR-CON) 8 MEQ tablet Take 1 tablet (8 mEq total) by mouth daily. (Patient not taking: Reported on 07/13/2019) 90 tablet 3  . rivastigmine (EXELON) 4.6 mg/24hr Place 1 patch (4.6 mg total) onto the skin daily. (Patient not taking: Reported on 07/13/2019) 30 patch 12   No facility-administered medications prior to visit.    ROS: Review of Systems  Constitutional: Positive for fatigue and unexpected weight change. Negative for activity change, appetite change and chills.  HENT: Negative for congestion, mouth sores and sinus pressure.   Eyes: Negative for visual disturbance.  Respiratory: Negative for cough and chest tightness.   Cardiovascular: Negative for chest pain.  Gastrointestinal: Negative for abdominal distention, abdominal pain and nausea.  Genitourinary: Negative for difficulty urinating, frequency and vaginal pain.  Musculoskeletal: Negative for back pain and gait problem.  Skin: Negative for pallor and rash.  Neurological: Negative for dizziness, tremors, weakness, numbness and headaches.  Psychiatric/Behavioral:  Positive for confusion and decreased concentration. Negative for sleep disturbance and suicidal ideas. The patient is not nervous/anxious.     Objective:  BP (!) 168/90 (BP Location: Left Arm, Patient Position: Sitting, Cuff Size: Small)   Pulse 88   Temp 98.2 F (36.8 C) (Oral)   Ht 5\' 1"  (1.549 m)   Wt 87 lb (39.5 kg)   SpO2 96%   BMI 16.44 kg/m   BP Readings from Last 3 Encounters:  07/13/19 (!) 168/90  06/28/19 136/80  06/02/19 (!) 164/78    Wt Readings from Last 3 Encounters:  07/13/19 87 lb (39.5 kg)  06/28/19 91 lb 9.6 oz (41.5 kg)  06/02/19 93 lb (42.2 kg)    Physical Exam Constitutional:      General: She is not in acute distress.    Appearance: She is well-developed.  HENT:     Head: Normocephalic.     Right Ear: External ear normal.     Left Ear: External ear normal.     Nose: Nose normal.  Eyes:     General:        Right eye: No discharge.        Left eye: No discharge.     Conjunctiva/sclera: Conjunctivae normal.     Pupils: Pupils are equal, round, and reactive to light.  Neck:     Thyroid: No thyromegaly.     Vascular: No JVD.     Trachea: No tracheal deviation.  Cardiovascular:     Rate and Rhythm: Normal rate and regular rhythm.     Heart sounds: Normal heart sounds.  Pulmonary:  Effort: No respiratory distress.     Breath sounds: No stridor. No wheezing.  Abdominal:     General: Bowel sounds are normal. There is no distension.     Palpations: Abdomen is soft. There is no mass.     Tenderness: There is no abdominal tenderness. There is no guarding or rebound.  Musculoskeletal:        General: No tenderness.     Cervical back: Normal range of motion and neck supple.  Lymphadenopathy:     Cervical: No cervical adenopathy.  Skin:    Findings: No erythema or rash.  Neurological:     Mental Status: She is disoriented.     Cranial Nerves: No cranial nerve deficit.     Motor: No abnormal muscle tone.     Coordination: Coordination normal.      Deep Tendon Reflexes: Reflexes normal.  Psychiatric:        Behavior: Behavior normal.        Thought Content: Thought content normal.        Judgment: Judgment normal.    Thin  FL2 filled out Time > 30 min  Lab Results  Component Value Date   WBC 4.5 06/28/2019   HGB 13.9 06/28/2019   HCT 42.1 06/28/2019   PLT 252.0 06/28/2019   GLUCOSE 107 (H) 06/28/2019   CHOL 213 (H) 09/02/2016   TRIG 157.0 (H) 09/02/2016   HDL 47.60 09/02/2016   LDLDIRECT 177.6 12/20/2009   LDLCALC 134 (H) 09/02/2016   ALT 10 06/28/2019   AST 19 06/28/2019   NA 139 06/28/2019   K 3.1 (L) 06/28/2019   CL 101 06/28/2019   CREATININE 0.84 06/28/2019   BUN 8 06/28/2019   CO2 31 06/28/2019   TSH 1.04 07/27/2018    DG Ribs Unilateral Right  Result Date: 09/24/2017 CLINICAL DATA:  Right anterior chest pain. Fall for 4 days ago. Contusion right chest wall, initial encounter. EXAM: RIGHT RIBS - 2 VIEW COMPARISON:  Chest radiograph 10/14/2016 FINDINGS: Right ribs are intact without a displaced fracture. Negative for right pneumothorax. No gross abnormality to the right shoulder. Stable sclerotic area in the T12 vertebral body. IMPRESSION: No acute abnormality. Electronically Signed   By: Markus Daft M.D.   On: 09/24/2017 08:19    Assessment & Plan:   There are no diagnoses linked to this encounter.   No orders of the defined types were placed in this encounter.    Follow-up: No follow-ups on file.  Walker Kehr, MD

## 2019-07-18 NOTE — Telephone Encounter (Signed)
    Daughter, Hassan Rowan requesting call to discuss forms

## 2019-07-18 NOTE — Telephone Encounter (Signed)
Called patient's daughter, no answer

## 2019-07-19 DIAGNOSIS — H35371 Puckering of macula, right eye: Secondary | ICD-10-CM | POA: Diagnosis not present

## 2019-07-19 DIAGNOSIS — Z961 Presence of intraocular lens: Secondary | ICD-10-CM | POA: Diagnosis not present

## 2019-07-19 DIAGNOSIS — H353123 Nonexudative age-related macular degeneration, left eye, advanced atrophic without subfoveal involvement: Secondary | ICD-10-CM | POA: Diagnosis not present

## 2019-07-19 DIAGNOSIS — H353211 Exudative age-related macular degeneration, right eye, with active choroidal neovascularization: Secondary | ICD-10-CM | POA: Diagnosis not present

## 2019-07-29 NOTE — Telephone Encounter (Signed)
Per 07/13/19 OV, FL2 forms filled out by PCP

## 2019-08-04 DIAGNOSIS — I10 Essential (primary) hypertension: Secondary | ICD-10-CM | POA: Diagnosis not present

## 2019-08-04 DIAGNOSIS — G301 Alzheimer's disease with late onset: Secondary | ICD-10-CM | POA: Diagnosis not present

## 2019-08-04 DIAGNOSIS — R2681 Unsteadiness on feet: Secondary | ICD-10-CM | POA: Diagnosis not present

## 2019-08-04 DIAGNOSIS — I509 Heart failure, unspecified: Secondary | ICD-10-CM | POA: Diagnosis not present

## 2019-08-08 DIAGNOSIS — Z79899 Other long term (current) drug therapy: Secondary | ICD-10-CM | POA: Diagnosis not present

## 2019-08-08 DIAGNOSIS — E038 Other specified hypothyroidism: Secondary | ICD-10-CM | POA: Diagnosis not present

## 2019-08-08 DIAGNOSIS — E7849 Other hyperlipidemia: Secondary | ICD-10-CM | POA: Diagnosis not present

## 2019-08-08 DIAGNOSIS — E119 Type 2 diabetes mellitus without complications: Secondary | ICD-10-CM | POA: Diagnosis not present

## 2019-08-08 DIAGNOSIS — D518 Other vitamin B12 deficiency anemias: Secondary | ICD-10-CM | POA: Diagnosis not present

## 2019-08-11 DIAGNOSIS — R451 Restlessness and agitation: Secondary | ICD-10-CM | POA: Diagnosis not present

## 2019-08-11 DIAGNOSIS — R109 Unspecified abdominal pain: Secondary | ICD-10-CM | POA: Diagnosis not present

## 2019-08-11 DIAGNOSIS — G301 Alzheimer's disease with late onset: Secondary | ICD-10-CM | POA: Diagnosis not present

## 2019-08-12 DIAGNOSIS — N390 Urinary tract infection, site not specified: Secondary | ICD-10-CM | POA: Diagnosis not present

## 2019-08-12 DIAGNOSIS — R350 Frequency of micturition: Secondary | ICD-10-CM | POA: Diagnosis not present

## 2019-08-12 DIAGNOSIS — N200 Calculus of kidney: Secondary | ICD-10-CM | POA: Diagnosis not present

## 2019-08-18 DIAGNOSIS — I509 Heart failure, unspecified: Secondary | ICD-10-CM | POA: Diagnosis not present

## 2019-08-18 DIAGNOSIS — G301 Alzheimer's disease with late onset: Secondary | ICD-10-CM | POA: Diagnosis not present

## 2019-08-18 DIAGNOSIS — R451 Restlessness and agitation: Secondary | ICD-10-CM | POA: Diagnosis not present

## 2019-08-18 DIAGNOSIS — I10 Essential (primary) hypertension: Secondary | ICD-10-CM | POA: Diagnosis not present

## 2019-08-18 DIAGNOSIS — R600 Localized edema: Secondary | ICD-10-CM | POA: Diagnosis not present

## 2019-08-26 DIAGNOSIS — I110 Hypertensive heart disease with heart failure: Secondary | ICD-10-CM | POA: Diagnosis not present

## 2019-08-26 DIAGNOSIS — Z9181 History of falling: Secondary | ICD-10-CM | POA: Diagnosis not present

## 2019-08-26 DIAGNOSIS — I509 Heart failure, unspecified: Secondary | ICD-10-CM | POA: Diagnosis not present

## 2019-08-26 DIAGNOSIS — R296 Repeated falls: Secondary | ICD-10-CM | POA: Diagnosis not present

## 2019-08-26 DIAGNOSIS — R2681 Unsteadiness on feet: Secondary | ICD-10-CM | POA: Diagnosis not present

## 2019-08-26 DIAGNOSIS — G309 Alzheimer's disease, unspecified: Secondary | ICD-10-CM | POA: Diagnosis not present

## 2019-08-26 DIAGNOSIS — M6281 Muscle weakness (generalized): Secondary | ICD-10-CM | POA: Diagnosis not present

## 2019-08-29 DIAGNOSIS — I110 Hypertensive heart disease with heart failure: Secondary | ICD-10-CM | POA: Diagnosis not present

## 2019-08-29 DIAGNOSIS — I509 Heart failure, unspecified: Secondary | ICD-10-CM | POA: Diagnosis not present

## 2019-08-29 DIAGNOSIS — Z9181 History of falling: Secondary | ICD-10-CM | POA: Diagnosis not present

## 2019-08-29 DIAGNOSIS — R296 Repeated falls: Secondary | ICD-10-CM | POA: Diagnosis not present

## 2019-08-29 DIAGNOSIS — G309 Alzheimer's disease, unspecified: Secondary | ICD-10-CM | POA: Diagnosis not present

## 2019-08-29 DIAGNOSIS — M6281 Muscle weakness (generalized): Secondary | ICD-10-CM | POA: Diagnosis not present

## 2019-08-29 DIAGNOSIS — R2681 Unsteadiness on feet: Secondary | ICD-10-CM | POA: Diagnosis not present

## 2019-09-01 DIAGNOSIS — M6281 Muscle weakness (generalized): Secondary | ICD-10-CM | POA: Diagnosis not present

## 2019-09-01 DIAGNOSIS — R2681 Unsteadiness on feet: Secondary | ICD-10-CM | POA: Diagnosis not present

## 2019-09-01 DIAGNOSIS — R296 Repeated falls: Secondary | ICD-10-CM | POA: Diagnosis not present

## 2019-09-01 DIAGNOSIS — Z9181 History of falling: Secondary | ICD-10-CM | POA: Diagnosis not present

## 2019-09-01 DIAGNOSIS — G309 Alzheimer's disease, unspecified: Secondary | ICD-10-CM | POA: Diagnosis not present

## 2019-09-01 DIAGNOSIS — I110 Hypertensive heart disease with heart failure: Secondary | ICD-10-CM | POA: Diagnosis not present

## 2019-09-01 DIAGNOSIS — I509 Heart failure, unspecified: Secondary | ICD-10-CM | POA: Diagnosis not present

## 2019-09-05 ENCOUNTER — Other Ambulatory Visit: Payer: Self-pay

## 2019-09-05 ENCOUNTER — Encounter: Payer: Self-pay | Admitting: Internal Medicine

## 2019-09-05 ENCOUNTER — Ambulatory Visit (INDEPENDENT_AMBULATORY_CARE_PROVIDER_SITE_OTHER): Payer: Medicare Other | Admitting: Internal Medicine

## 2019-09-05 VITALS — BP 110/68 | HR 94 | Temp 98.4°F | Ht 61.0 in | Wt 89.0 lb

## 2019-09-05 DIAGNOSIS — R634 Abnormal weight loss: Secondary | ICD-10-CM

## 2019-09-05 DIAGNOSIS — F0281 Dementia in other diseases classified elsewhere with behavioral disturbance: Secondary | ICD-10-CM | POA: Diagnosis not present

## 2019-09-05 DIAGNOSIS — I10 Essential (primary) hypertension: Secondary | ICD-10-CM | POA: Diagnosis not present

## 2019-09-05 DIAGNOSIS — G301 Alzheimer's disease with late onset: Secondary | ICD-10-CM | POA: Diagnosis not present

## 2019-09-05 DIAGNOSIS — F02818 Dementia in other diseases classified elsewhere, unspecified severity, with other behavioral disturbance: Secondary | ICD-10-CM

## 2019-09-05 LAB — BASIC METABOLIC PANEL WITH GFR
BUN: 9 mg/dL (ref 7–25)
CO2: 26 mmol/L (ref 20–32)
Calcium: 9.2 mg/dL (ref 8.6–10.4)
Chloride: 105 mmol/L (ref 98–110)
Creat: 0.81 mg/dL (ref 0.60–0.88)
GFR, Est African American: 71 mL/min/{1.73_m2} (ref >=60)
GFR, Est Non African American: 61 mL/min/{1.73_m2} (ref >=60)
Glucose, Bld: 131 mg/dL — ABNORMAL HIGH (ref 65–99)
Potassium: 4.1 mmol/L (ref 3.5–5.3)
Sodium: 140 mmol/L (ref 135–146)

## 2019-09-05 LAB — TSH: TSH: 0.45 mIU/L (ref 0.40–4.50)

## 2019-09-05 NOTE — Assessment & Plan Note (Signed)
Labs  Wt Readings from Last 3 Encounters:  09/05/19 89 lb (40.4 kg)  07/13/19 87 lb (39.5 kg)  06/28/19 91 lb 9.6 oz (41.5 kg)  Pt moved to Assisted Living in 7/21

## 2019-09-05 NOTE — Assessment & Plan Note (Signed)
Pt moved to Assisted Living in 7/21

## 2019-09-05 NOTE — Progress Notes (Signed)
Subjective:  Patient ID: Lindsey Dickson, female    DOB: July 06, 1920  Age: 86 y.o. MRN: [id number]  CC: No chief complaint on file.   HPI Lindsey Dickson presents for dementia, HTN Pt moved to [place] in 7/21  Outpatient Medications Prior to Visit  Medication Sig Dispense Refill  . acetaminophen (TYLENOL) 325 MG tablet Take 650 mg by mouth every 6 (six) hours as needed.     [name] b complex vitamins tablet Take 1 tablet by mouth daily. 100 tablet 3  . Cholecalciferol 1000 UNITS tablet Take 1,000 Units by mouth daily.      . magnesium hydroxide (MILK OF MAGNESIA) 400 MG/5ML suspension Take 7.5 mLs by mouth daily as needed for mild constipation. Reported on 06/25/2015    . potassium chloride (KLOR-CON) 8 MEQ tablet Take 1 tablet (8 mEq total) by mouth daily. 90 tablet 3  . rivastigmine (EXELON) 4.6 mg/24hr Place 1 patch (4.6 mg total) onto the skin daily. 30 patch 12   No facility-administered medications prior to visit.    ROS: Review of Systems  Constitutional: Positive for unexpected weight change. Negative for activity change, appetite change, chills and fatigue.  HENT: Negative for congestion, mouth sores and sinus pressure.   Eyes: Negative for visual disturbance.  Respiratory: Negative for cough and chest tightness.   Gastrointestinal: Negative for abdominal pain and nausea.  Genitourinary: Negative for difficulty urinating, frequency and vaginal pain.  Musculoskeletal: Negative for back pain and gait problem.  Skin: Negative for pallor and rash.  Neurological: Negative for dizziness, tremors, weakness, numbness and headaches.  Psychiatric/Behavioral: Positive for behavioral problems, confusion and decreased concentration. Negative for sleep disturbance. The patient is nervous/anxious.     Objective:  BP 110/68 (BP Location: Left Arm, Patient Position: Sitting, Cuff Size: Small)   Pulse 94   Temp 98.4 F (36.9 C) (Oral)   Ht 5\' 1"  (1.549 m)   Wt 89 lb (40.4 kg)   SpO2 98%    BMI 16.82 kg/m   BP Readings from Last 3 Encounters:  09/05/19 110/68  07/13/19 (!) 168/90  06/28/19 136/80    Wt Readings from Last 3 Encounters:  09/05/19 89 lb (40.4 kg)  07/13/19 87 lb (39.5 kg)  06/28/19 91 lb 9.6 oz (41.5 kg)    Physical Exam Constitutional:      General: She is not in acute distress.    Appearance: She is well-developed.  HENT:     Head: Normocephalic.     Right Ear: External ear normal.     Left Ear: External ear normal.     Nose: Nose normal.  Eyes:     General:        Right eye: No discharge.        Left eye: No discharge.     Conjunctiva/sclera: Conjunctivae normal.     Pupils: Pupils are equal, round, and reactive to light.  Neck:     Thyroid: No thyromegaly.     Vascular: No JVD.     Trachea: No tracheal deviation.  Cardiovascular:     Rate and Rhythm: Normal rate and regular rhythm.     Heart sounds: Normal heart sounds.  Pulmonary:     Effort: No respiratory distress.     Breath sounds: No stridor. No wheezing.  Abdominal:     General: Bowel sounds are normal. There is no distension.     Palpations: Abdomen is soft. There is no mass.     Tenderness: There is  no abdominal tenderness. There is no guarding or rebound.  Musculoskeletal:        General: No tenderness.     Cervical back: Normal range of motion and neck supple.  Lymphadenopathy:     Cervical: No cervical adenopathy.  Skin:    Findings: No erythema or rash.  Neurological:     Mental Status: She is disoriented.     Cranial Nerves: No cranial nerve deficit.     Motor: No abnormal muscle tone.     Coordination: Coordination normal.     Deep Tendon Reflexes: Reflexes normal.  Psychiatric:        Behavior: Behavior normal.        Thought Content: Thought content normal.        Judgment: Judgment normal.     Lab Results  Component Value Date   WBC 4.5 06/28/2019   HGB 13.9 06/28/2019   HCT 42.1 06/28/2019   PLT 252.0 06/28/2019   GLUCOSE 107 (H) 06/28/2019    CHOL 213 (H) 09/02/2016   TRIG 157.0 (H) 09/02/2016   HDL 47.60 09/02/2016   LDLDIRECT 177.6 12/20/2009   LDLCALC 134 (H) 09/02/2016   ALT 10 06/28/2019   AST 19 06/28/2019   NA 139 06/28/2019   K 3.1 (L) 06/28/2019   CL 101 06/28/2019   CREATININE 0.84 06/28/2019   BUN 8 06/28/2019   CO2 31 06/28/2019   TSH 1.04 07/27/2018    DG Ribs Unilateral Right  Result Date: 09/24/2017 CLINICAL DATA:  Right anterior chest pain. Fall for 4 days ago. Contusion right chest wall, initial encounter. EXAM: RIGHT RIBS - 2 VIEW COMPARISON:  Chest radiograph 10/14/2016 FINDINGS: Right ribs are intact without a displaced fracture. Negative for right pneumothorax. No gross abnormality to the right shoulder. Stable sclerotic area in the T12 vertebral body. IMPRESSION: No acute abnormality. Electronically Signed   By: Markus Daft M.D.   On: 09/24/2017 08:19    Assessment & Plan:     Follow-up: No follow-ups on file.  Walker Kehr, MD

## 2019-09-07 DIAGNOSIS — R2681 Unsteadiness on feet: Secondary | ICD-10-CM | POA: Diagnosis not present

## 2019-09-07 DIAGNOSIS — M6281 Muscle weakness (generalized): Secondary | ICD-10-CM | POA: Diagnosis not present

## 2019-09-07 DIAGNOSIS — Z9181 History of falling: Secondary | ICD-10-CM | POA: Diagnosis not present

## 2019-09-07 DIAGNOSIS — G309 Alzheimer's disease, unspecified: Secondary | ICD-10-CM | POA: Diagnosis not present

## 2019-09-07 DIAGNOSIS — R296 Repeated falls: Secondary | ICD-10-CM | POA: Diagnosis not present

## 2019-09-07 DIAGNOSIS — I110 Hypertensive heart disease with heart failure: Secondary | ICD-10-CM | POA: Diagnosis not present

## 2019-09-07 DIAGNOSIS — I509 Heart failure, unspecified: Secondary | ICD-10-CM | POA: Diagnosis not present

## 2019-09-09 DIAGNOSIS — I509 Heart failure, unspecified: Secondary | ICD-10-CM | POA: Diagnosis not present

## 2019-09-09 DIAGNOSIS — G309 Alzheimer's disease, unspecified: Secondary | ICD-10-CM | POA: Diagnosis not present

## 2019-09-09 DIAGNOSIS — M6281 Muscle weakness (generalized): Secondary | ICD-10-CM | POA: Diagnosis not present

## 2019-09-09 DIAGNOSIS — Z9181 History of falling: Secondary | ICD-10-CM | POA: Diagnosis not present

## 2019-09-09 DIAGNOSIS — I110 Hypertensive heart disease with heart failure: Secondary | ICD-10-CM | POA: Diagnosis not present

## 2019-09-09 DIAGNOSIS — R296 Repeated falls: Secondary | ICD-10-CM | POA: Diagnosis not present

## 2019-09-09 DIAGNOSIS — R2681 Unsteadiness on feet: Secondary | ICD-10-CM | POA: Diagnosis not present

## 2019-09-12 DIAGNOSIS — M6281 Muscle weakness (generalized): Secondary | ICD-10-CM | POA: Diagnosis not present

## 2019-09-12 DIAGNOSIS — R2681 Unsteadiness on feet: Secondary | ICD-10-CM | POA: Diagnosis not present

## 2019-09-12 DIAGNOSIS — G309 Alzheimer's disease, unspecified: Secondary | ICD-10-CM | POA: Diagnosis not present

## 2019-09-12 DIAGNOSIS — I110 Hypertensive heart disease with heart failure: Secondary | ICD-10-CM | POA: Diagnosis not present

## 2019-09-12 DIAGNOSIS — R296 Repeated falls: Secondary | ICD-10-CM | POA: Diagnosis not present

## 2019-09-12 DIAGNOSIS — I509 Heart failure, unspecified: Secondary | ICD-10-CM | POA: Diagnosis not present

## 2019-09-12 DIAGNOSIS — Z9181 History of falling: Secondary | ICD-10-CM | POA: Diagnosis not present

## 2019-09-14 DIAGNOSIS — I110 Hypertensive heart disease with heart failure: Secondary | ICD-10-CM | POA: Diagnosis not present

## 2019-09-14 DIAGNOSIS — R2681 Unsteadiness on feet: Secondary | ICD-10-CM | POA: Diagnosis not present

## 2019-09-14 DIAGNOSIS — G309 Alzheimer's disease, unspecified: Secondary | ICD-10-CM | POA: Diagnosis not present

## 2019-09-14 DIAGNOSIS — I509 Heart failure, unspecified: Secondary | ICD-10-CM | POA: Diagnosis not present

## 2019-09-14 DIAGNOSIS — Z9181 History of falling: Secondary | ICD-10-CM | POA: Diagnosis not present

## 2019-09-14 DIAGNOSIS — R296 Repeated falls: Secondary | ICD-10-CM | POA: Diagnosis not present

## 2019-09-14 DIAGNOSIS — M6281 Muscle weakness (generalized): Secondary | ICD-10-CM | POA: Diagnosis not present

## 2019-09-15 DIAGNOSIS — I10 Essential (primary) hypertension: Secondary | ICD-10-CM | POA: Diagnosis not present

## 2019-09-15 DIAGNOSIS — H1013 Acute atopic conjunctivitis, bilateral: Secondary | ICD-10-CM | POA: Diagnosis not present

## 2019-09-15 DIAGNOSIS — M79601 Pain in right arm: Secondary | ICD-10-CM | POA: Diagnosis not present

## 2019-09-15 DIAGNOSIS — I509 Heart failure, unspecified: Secondary | ICD-10-CM | POA: Diagnosis not present

## 2019-09-19 DIAGNOSIS — I110 Hypertensive heart disease with heart failure: Secondary | ICD-10-CM | POA: Diagnosis not present

## 2019-09-19 DIAGNOSIS — Z9181 History of falling: Secondary | ICD-10-CM | POA: Diagnosis not present

## 2019-09-19 DIAGNOSIS — R2681 Unsteadiness on feet: Secondary | ICD-10-CM | POA: Diagnosis not present

## 2019-09-19 DIAGNOSIS — G309 Alzheimer's disease, unspecified: Secondary | ICD-10-CM | POA: Diagnosis not present

## 2019-09-19 DIAGNOSIS — I509 Heart failure, unspecified: Secondary | ICD-10-CM | POA: Diagnosis not present

## 2019-09-19 DIAGNOSIS — M6281 Muscle weakness (generalized): Secondary | ICD-10-CM | POA: Diagnosis not present

## 2019-09-19 DIAGNOSIS — R296 Repeated falls: Secondary | ICD-10-CM | POA: Diagnosis not present

## 2019-09-21 DIAGNOSIS — R296 Repeated falls: Secondary | ICD-10-CM | POA: Diagnosis not present

## 2019-09-21 DIAGNOSIS — I110 Hypertensive heart disease with heart failure: Secondary | ICD-10-CM | POA: Diagnosis not present

## 2019-09-21 DIAGNOSIS — R2681 Unsteadiness on feet: Secondary | ICD-10-CM | POA: Diagnosis not present

## 2019-09-21 DIAGNOSIS — M6281 Muscle weakness (generalized): Secondary | ICD-10-CM | POA: Diagnosis not present

## 2019-09-21 DIAGNOSIS — I509 Heart failure, unspecified: Secondary | ICD-10-CM | POA: Diagnosis not present

## 2019-09-21 DIAGNOSIS — Z9181 History of falling: Secondary | ICD-10-CM | POA: Diagnosis not present

## 2019-09-21 DIAGNOSIS — G309 Alzheimer's disease, unspecified: Secondary | ICD-10-CM | POA: Diagnosis not present

## 2019-09-22 DIAGNOSIS — R451 Restlessness and agitation: Secondary | ICD-10-CM | POA: Diagnosis not present

## 2019-09-22 DIAGNOSIS — G301 Alzheimer's disease with late onset: Secondary | ICD-10-CM | POA: Diagnosis not present

## 2019-09-25 DIAGNOSIS — I110 Hypertensive heart disease with heart failure: Secondary | ICD-10-CM | POA: Diagnosis not present

## 2019-09-25 DIAGNOSIS — M6281 Muscle weakness (generalized): Secondary | ICD-10-CM | POA: Diagnosis not present

## 2019-09-25 DIAGNOSIS — R2681 Unsteadiness on feet: Secondary | ICD-10-CM | POA: Diagnosis not present

## 2019-09-25 DIAGNOSIS — R296 Repeated falls: Secondary | ICD-10-CM | POA: Diagnosis not present

## 2019-09-25 DIAGNOSIS — I509 Heart failure, unspecified: Secondary | ICD-10-CM | POA: Diagnosis not present

## 2019-09-25 DIAGNOSIS — G309 Alzheimer's disease, unspecified: Secondary | ICD-10-CM | POA: Diagnosis not present

## 2019-09-25 DIAGNOSIS — Z9181 History of falling: Secondary | ICD-10-CM | POA: Diagnosis not present

## 2019-09-28 DIAGNOSIS — I110 Hypertensive heart disease with heart failure: Secondary | ICD-10-CM | POA: Diagnosis not present

## 2019-09-28 DIAGNOSIS — G309 Alzheimer's disease, unspecified: Secondary | ICD-10-CM | POA: Diagnosis not present

## 2019-09-28 DIAGNOSIS — M6281 Muscle weakness (generalized): Secondary | ICD-10-CM | POA: Diagnosis not present

## 2019-09-28 DIAGNOSIS — Z9181 History of falling: Secondary | ICD-10-CM | POA: Diagnosis not present

## 2019-09-28 DIAGNOSIS — R2681 Unsteadiness on feet: Secondary | ICD-10-CM | POA: Diagnosis not present

## 2019-09-28 DIAGNOSIS — R296 Repeated falls: Secondary | ICD-10-CM | POA: Diagnosis not present

## 2019-09-28 DIAGNOSIS — I509 Heart failure, unspecified: Secondary | ICD-10-CM | POA: Diagnosis not present

## 2019-09-29 DIAGNOSIS — G301 Alzheimer's disease with late onset: Secondary | ICD-10-CM | POA: Diagnosis not present

## 2019-09-29 DIAGNOSIS — N390 Urinary tract infection, site not specified: Secondary | ICD-10-CM | POA: Diagnosis not present

## 2019-09-29 DIAGNOSIS — M79601 Pain in right arm: Secondary | ICD-10-CM | POA: Diagnosis not present

## 2019-09-29 DIAGNOSIS — R451 Restlessness and agitation: Secondary | ICD-10-CM | POA: Diagnosis not present

## 2019-10-04 DIAGNOSIS — Z9181 History of falling: Secondary | ICD-10-CM | POA: Diagnosis not present

## 2019-10-04 DIAGNOSIS — I509 Heart failure, unspecified: Secondary | ICD-10-CM | POA: Diagnosis not present

## 2019-10-04 DIAGNOSIS — R2681 Unsteadiness on feet: Secondary | ICD-10-CM | POA: Diagnosis not present

## 2019-10-04 DIAGNOSIS — G309 Alzheimer's disease, unspecified: Secondary | ICD-10-CM | POA: Diagnosis not present

## 2019-10-04 DIAGNOSIS — I110 Hypertensive heart disease with heart failure: Secondary | ICD-10-CM | POA: Diagnosis not present

## 2019-10-04 DIAGNOSIS — M6281 Muscle weakness (generalized): Secondary | ICD-10-CM | POA: Diagnosis not present

## 2019-10-04 DIAGNOSIS — R296 Repeated falls: Secondary | ICD-10-CM | POA: Diagnosis not present

## 2019-10-05 DIAGNOSIS — Z743 Need for continuous supervision: Secondary | ICD-10-CM | POA: Diagnosis not present

## 2019-10-05 DIAGNOSIS — M25572 Pain in left ankle and joints of left foot: Secondary | ICD-10-CM | POA: Diagnosis not present

## 2019-10-05 DIAGNOSIS — H353133 Nonexudative age-related macular degeneration, bilateral, advanced atrophic without subfoveal involvement: Secondary | ICD-10-CM | POA: Diagnosis not present

## 2019-10-05 DIAGNOSIS — R404 Transient alteration of awareness: Secondary | ICD-10-CM | POA: Diagnosis not present

## 2019-10-05 DIAGNOSIS — R9082 White matter disease, unspecified: Secondary | ICD-10-CM | POA: Diagnosis not present

## 2019-10-05 DIAGNOSIS — G9389 Other specified disorders of brain: Secondary | ICD-10-CM | POA: Diagnosis not present

## 2019-10-05 DIAGNOSIS — I6782 Cerebral ischemia: Secondary | ICD-10-CM | POA: Diagnosis not present

## 2019-10-05 DIAGNOSIS — G939 Disorder of brain, unspecified: Secondary | ICD-10-CM | POA: Diagnosis not present

## 2019-10-05 DIAGNOSIS — I499 Cardiac arrhythmia, unspecified: Secondary | ICD-10-CM | POA: Diagnosis not present

## 2019-10-05 DIAGNOSIS — R0902 Hypoxemia: Secondary | ICD-10-CM | POA: Diagnosis not present

## 2019-10-05 DIAGNOSIS — G9689 Other specified disorders of central nervous system: Secondary | ICD-10-CM | POA: Diagnosis not present

## 2019-10-05 DIAGNOSIS — M25562 Pain in left knee: Secondary | ICD-10-CM | POA: Diagnosis not present

## 2019-10-05 DIAGNOSIS — G319 Degenerative disease of nervous system, unspecified: Secondary | ICD-10-CM | POA: Diagnosis not present

## 2019-10-05 DIAGNOSIS — Z79899 Other long term (current) drug therapy: Secondary | ICD-10-CM | POA: Diagnosis not present

## 2019-10-05 DIAGNOSIS — E860 Dehydration: Secondary | ICD-10-CM | POA: Diagnosis not present

## 2019-10-05 DIAGNOSIS — R82998 Other abnormal findings in urine: Secondary | ICD-10-CM | POA: Diagnosis not present

## 2019-10-05 DIAGNOSIS — G936 Cerebral edema: Secondary | ICD-10-CM | POA: Diagnosis not present

## 2019-10-05 DIAGNOSIS — H3530 Unspecified macular degeneration: Secondary | ICD-10-CM | POA: Diagnosis not present

## 2019-10-05 DIAGNOSIS — G9340 Encephalopathy, unspecified: Secondary | ICD-10-CM | POA: Diagnosis not present

## 2019-10-06 DIAGNOSIS — H3530 Unspecified macular degeneration: Secondary | ICD-10-CM | POA: Diagnosis not present

## 2019-10-06 DIAGNOSIS — R638 Other symptoms and signs concerning food and fluid intake: Secondary | ICD-10-CM | POA: Diagnosis not present

## 2019-10-06 DIAGNOSIS — G9389 Other specified disorders of brain: Secondary | ICD-10-CM | POA: Diagnosis not present

## 2019-10-06 DIAGNOSIS — G319 Degenerative disease of nervous system, unspecified: Secondary | ICD-10-CM | POA: Diagnosis not present

## 2019-10-06 DIAGNOSIS — I6782 Cerebral ischemia: Secondary | ICD-10-CM | POA: Diagnosis not present

## 2019-10-06 DIAGNOSIS — G939 Disorder of brain, unspecified: Secondary | ICD-10-CM | POA: Diagnosis not present

## 2019-10-06 DIAGNOSIS — G9340 Encephalopathy, unspecified: Secondary | ICD-10-CM | POA: Diagnosis not present

## 2019-10-06 DIAGNOSIS — Z515 Encounter for palliative care: Secondary | ICD-10-CM | POA: Diagnosis not present

## 2019-10-06 DIAGNOSIS — G936 Cerebral edema: Secondary | ICD-10-CM | POA: Diagnosis not present

## 2019-10-07 DIAGNOSIS — R6889 Other general symptoms and signs: Secondary | ICD-10-CM | POA: Diagnosis not present

## 2019-10-07 DIAGNOSIS — Z743 Need for continuous supervision: Secondary | ICD-10-CM | POA: Diagnosis not present

## 2019-10-07 DIAGNOSIS — Z66 Do not resuscitate: Secondary | ICD-10-CM | POA: Diagnosis not present

## 2019-10-07 DIAGNOSIS — M2550 Pain in unspecified joint: Secondary | ICD-10-CM | POA: Diagnosis not present

## 2019-10-07 DIAGNOSIS — R531 Weakness: Secondary | ICD-10-CM | POA: Diagnosis not present

## 2019-10-07 DIAGNOSIS — D320 Benign neoplasm of cerebral meninges: Secondary | ICD-10-CM | POA: Diagnosis not present

## 2019-10-07 DIAGNOSIS — G9689 Other specified disorders of central nervous system: Secondary | ICD-10-CM | POA: Diagnosis not present

## 2019-10-07 DIAGNOSIS — Z7401 Bed confinement status: Secondary | ICD-10-CM | POA: Diagnosis not present

## 2019-10-20 ENCOUNTER — Telehealth: Payer: Self-pay | Admitting: Internal Medicine

## 2019-10-20 NOTE — Telephone Encounter (Signed)
Hassan Rowan Lea(patients daughter) called and said that she needs a letter from her mothers doctor stating that the patient can no longer speak for herself. The social security office is asking for one.     Please call Hassan Rowan back: [phone]

## 2019-10-23 ENCOUNTER — Other Ambulatory Visit: Payer: Self-pay | Admitting: Internal Medicine

## 2019-10-23 NOTE — Telephone Encounter (Signed)
Lindsey Dickson, Please write a letter for the patient stating that she is incompetent due to dementia and unable to make her own decisions. Thanks, AP

## 2019-10-26 NOTE — Telephone Encounter (Signed)
Generated letter.. MD signed call daughter for pick-up.Marland KitchenJohny Chess

## 2019-11-30 ENCOUNTER — Telehealth: Payer: Self-pay | Admitting: Internal Medicine

## 2019-11-30 NOTE — Telephone Encounter (Signed)
LVM for pt to rtn my call to schedule AWV with [facility]. Please schedule if pt calls the office.  ?

## 2019-12-06 ENCOUNTER — Ambulatory Visit: Payer: Medicare Other | Admitting: Internal Medicine

## 2020-02-21 DEATH — deceased

## 2020-09-28 IMAGING — DX DG CHEST 2V
2 series · 2 of 2 positions shown · non-contrast
Comparison: October 14, 2016.

CLINICAL DATA: Unintentional weight loss.

EXAM:
CHEST - 2 VIEW

[chest pa]
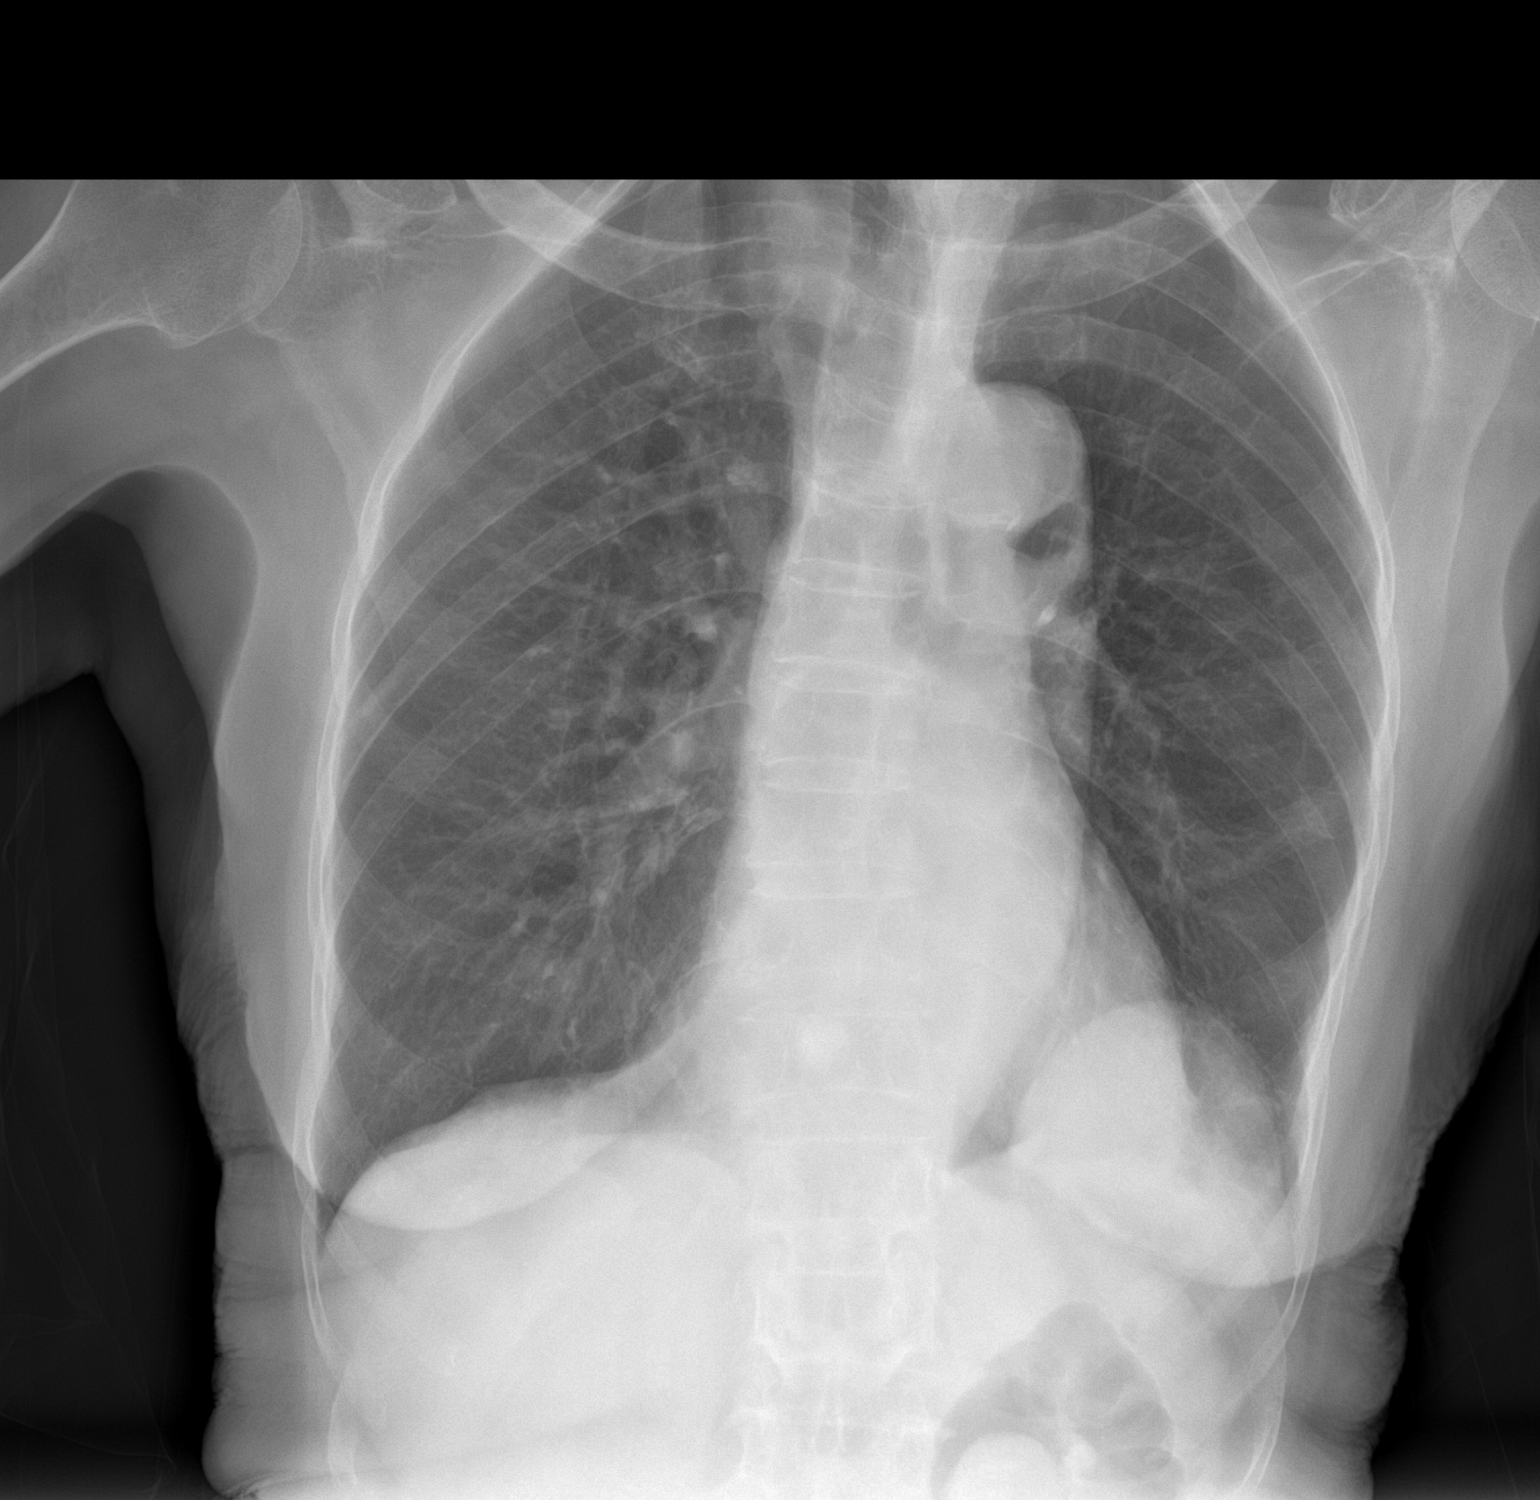

[chest lat]
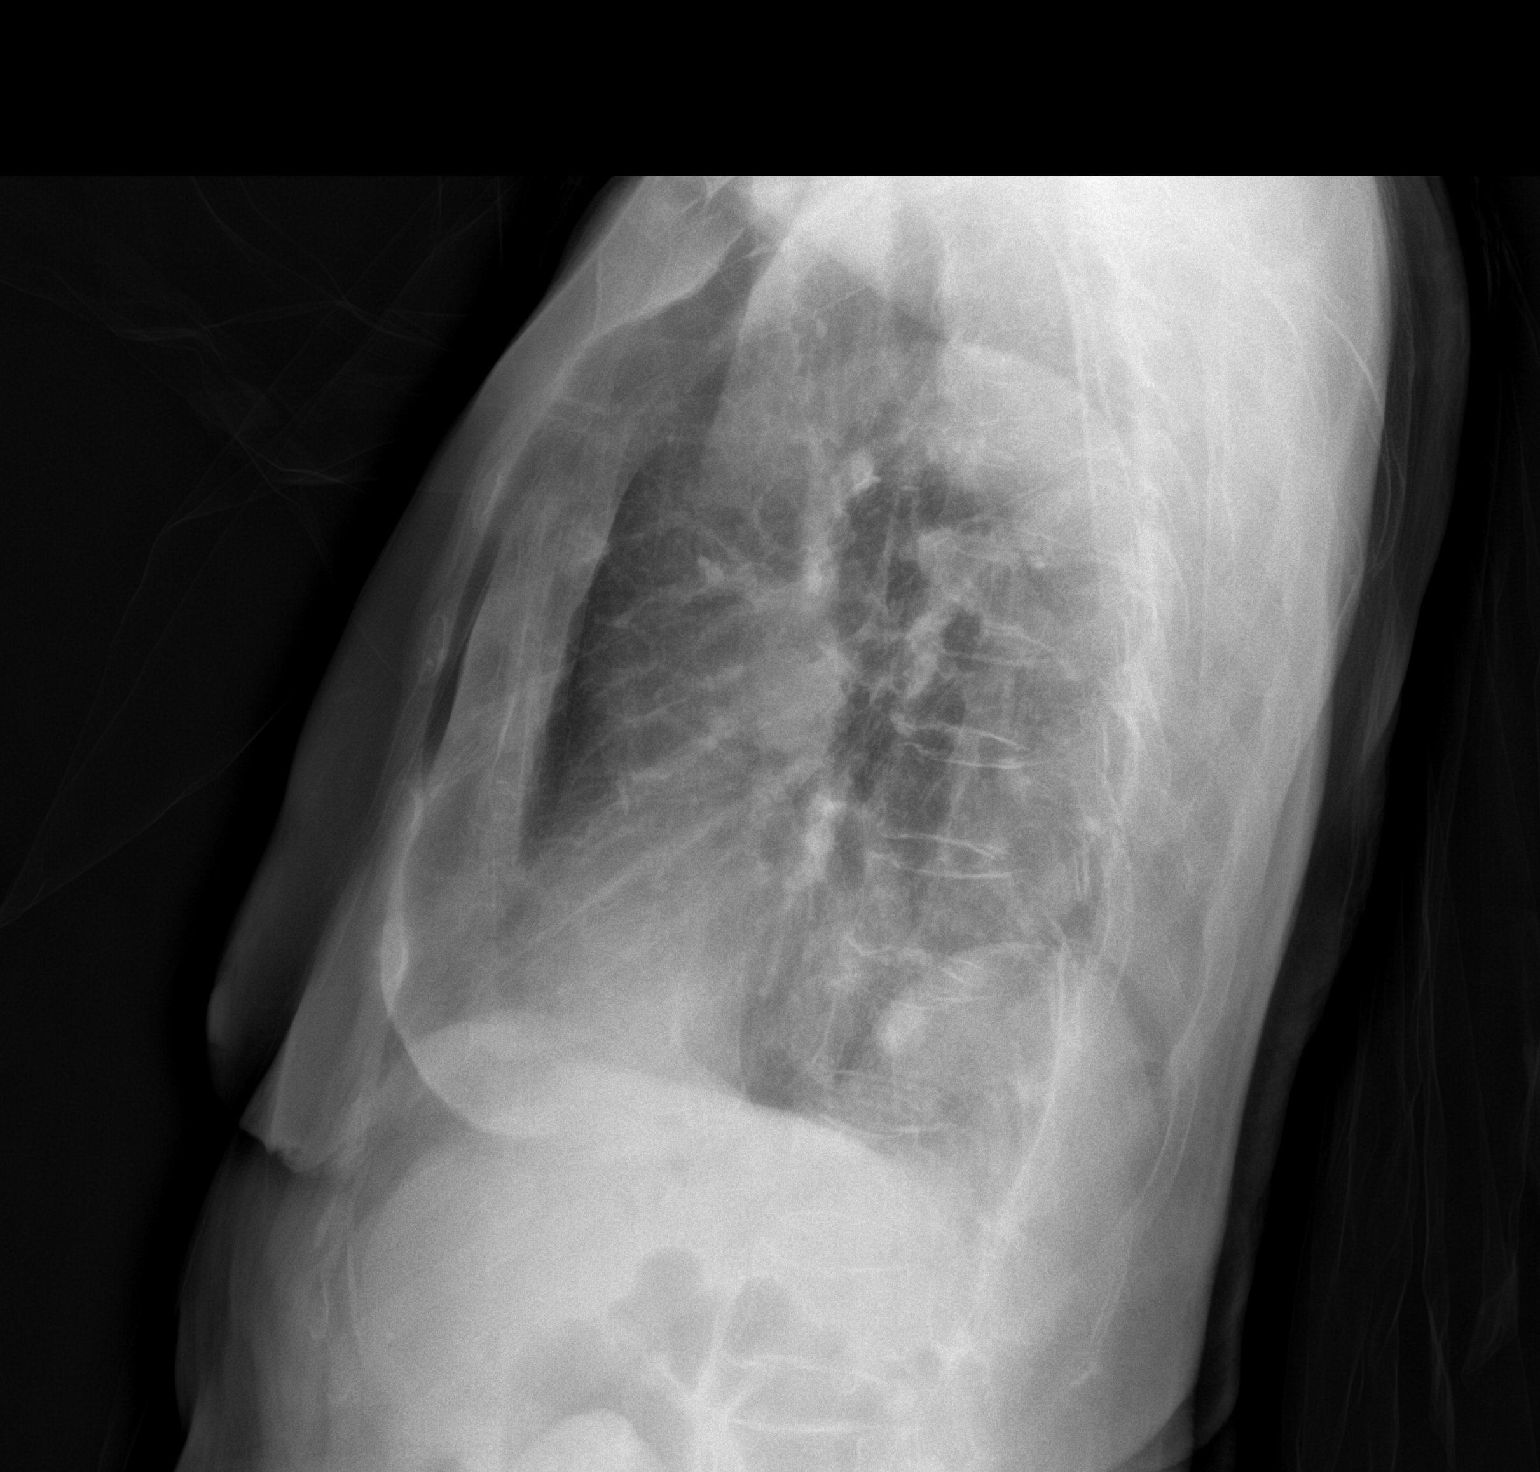

[2 of 2 positions shown; findings below may reference images not displayed]

FINDINGS: The heart size and mediastinal contours are within normal limits.
Both lungs are clear. Stable eventration is seen involving posterior
portion of left hemidiaphragm. The visualized skeletal structures
are unremarkable.
IMPRESSION: No active cardiopulmonary disease.
